# Patient Record
Sex: Female | Born: 2000 | Race: White | Hispanic: No | Marital: Single | Smoking: Never smoker
Health system: Southern US, Community
[De-identification: ages and names within clinical notes are randomized; demographics above are authoritative.]

## PROBLEM LIST (undated history)

## (undated) DIAGNOSIS — F259 Schizoaffective disorder, unspecified: Secondary | ICD-10-CM

---
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
## 2020-12-02 DIAGNOSIS — G90A Postural orthostatic tachycardia syndrome (POTS): Secondary | ICD-10-CM | POA: Diagnosis present

## 2020-12-02 DIAGNOSIS — F329 Major depressive disorder, single episode, unspecified: Secondary | ICD-10-CM | POA: Insufficient documentation

## 2020-12-02 DIAGNOSIS — F411 Generalized anxiety disorder: Secondary | ICD-10-CM | POA: Diagnosis present

## 2020-12-02 DIAGNOSIS — E559 Vitamin D deficiency, unspecified: Secondary | ICD-10-CM | POA: Insufficient documentation

## 2021-05-03 ENCOUNTER — Inpatient Hospital Stay (HOSPITAL_COMMUNITY)
Admission: RE | Admit: 2021-05-03 | Discharge: 2021-05-08 | DRG: 885 | Disposition: A | Discharge status: Home / Self Care | Payer: 59 | Attending: Psychiatry | Admitting: Psychiatry

## 2021-05-03 ENCOUNTER — Other Ambulatory Visit: Payer: Self-pay

## 2021-05-03 ENCOUNTER — Encounter (HOSPITAL_COMMUNITY): Payer: Self-pay | Admitting: Student

## 2021-05-03 ENCOUNTER — Encounter (HOSPITAL_COMMUNITY): Payer: Self-pay

## 2021-05-03 DIAGNOSIS — F411 Generalized anxiety disorder: Secondary | ICD-10-CM | POA: Diagnosis present

## 2021-05-03 DIAGNOSIS — Z20822 Contact with and (suspected) exposure to covid-19: Secondary | ICD-10-CM | POA: Diagnosis present

## 2021-05-03 DIAGNOSIS — R45851 Suicidal ideations: Secondary | ICD-10-CM | POA: Diagnosis present

## 2021-05-03 DIAGNOSIS — K5909 Other constipation: Secondary | ICD-10-CM | POA: Diagnosis present

## 2021-05-03 DIAGNOSIS — Z9152 Personal history of nonsuicidal self-harm: Secondary | ICD-10-CM | POA: Diagnosis not present

## 2021-05-03 DIAGNOSIS — F314 Bipolar disorder, current episode depressed, severe, without psychotic features: Principal | ICD-10-CM

## 2021-05-03 DIAGNOSIS — F410 Panic disorder [episodic paroxysmal anxiety] without agoraphobia: Secondary | ICD-10-CM | POA: Diagnosis present

## 2021-05-03 DIAGNOSIS — G4700 Insomnia, unspecified: Secondary | ICD-10-CM | POA: Diagnosis present

## 2021-05-03 DIAGNOSIS — G90A Postural orthostatic tachycardia syndrome (POTS): Secondary | ICD-10-CM | POA: Diagnosis present

## 2021-05-03 DIAGNOSIS — E559 Vitamin D deficiency, unspecified: Secondary | ICD-10-CM | POA: Diagnosis present

## 2021-05-03 DIAGNOSIS — Z79890 Hormone replacement therapy (postmenopausal): Secondary | ICD-10-CM | POA: Diagnosis not present

## 2021-05-03 DIAGNOSIS — F5081 Binge eating disorder: Secondary | ICD-10-CM | POA: Diagnosis present

## 2021-05-03 DIAGNOSIS — F4310 Post-traumatic stress disorder, unspecified: Secondary | ICD-10-CM | POA: Diagnosis present

## 2021-05-03 DIAGNOSIS — Z818 Family history of other mental and behavioral disorders: Secondary | ICD-10-CM | POA: Diagnosis not present

## 2021-05-03 DIAGNOSIS — F900 Attention-deficit hyperactivity disorder, predominantly inattentive type: Secondary | ICD-10-CM | POA: Diagnosis present

## 2021-05-03 DIAGNOSIS — F429 Obsessive-compulsive disorder, unspecified: Secondary | ICD-10-CM | POA: Diagnosis present

## 2021-05-03 DIAGNOSIS — E038 Other specified hypothyroidism: Secondary | ICD-10-CM | POA: Diagnosis present

## 2021-05-03 DIAGNOSIS — Z79899 Other long term (current) drug therapy: Secondary | ICD-10-CM | POA: Diagnosis not present

## 2021-05-03 DIAGNOSIS — F329 Major depressive disorder, single episode, unspecified: Secondary | ICD-10-CM | POA: Diagnosis present

## 2021-05-03 LAB — HEMOGLOBIN A1C
Hgb A1c MFr Bld: 5 % (ref 4.8–5.6)
Mean Plasma Glucose: 96.8 mg/dL

## 2021-05-03 LAB — COMPREHENSIVE METABOLIC PANEL
ALT: 11 U/L (ref 0–44)
AST: 18 U/L (ref 15–41)
Albumin: 3.9 g/dL (ref 3.5–5.0)
Alkaline Phosphatase: 55 U/L (ref 38–126)
Anion gap: 6 (ref 5–15)
BUN: 14 mg/dL (ref 6–20)
CO2: 26 mmol/L (ref 22–32)
Calcium: 9.2 mg/dL (ref 8.9–10.3)
Chloride: 106 mmol/L (ref 98–111)
Creatinine, Ser: 1.02 mg/dL — ABNORMAL HIGH (ref 0.44–1.00)
GFR, Estimated: >60 mL/min (ref >60)
Glucose, Bld: 114 mg/dL — ABNORMAL HIGH (ref 70–99)
Potassium: 4.1 mmol/L (ref 3.5–5.1)
Sodium: 138 mmol/L (ref 135–145)
Total Bilirubin: 0.3 mg/dL (ref 0.3–1.2)
Total Protein: 6.8 g/dL (ref 6.5–8.1)

## 2021-05-03 LAB — RAPID URINE DRUG SCREEN, HOSP PERFORMED
Amphetamines: NOT DETECTED
Barbiturates: NOT DETECTED
Benzodiazepines: NOT DETECTED
Cocaine: NOT DETECTED
Opiates: NOT DETECTED
Tetrahydrocannabinol: NOT DETECTED

## 2021-05-03 LAB — CBC
HCT: 40.8 % (ref 36.0–46.0)
Hemoglobin: 13.2 g/dL (ref 12.0–15.0)
MCH: 30.9 pg (ref 26.0–34.0)
MCHC: 32.4 g/dL (ref 30.0–36.0)
MCV: 95.6 fL (ref 80.0–100.0)
Platelets: 207 10*3/uL (ref 150–400)
RBC: 4.27 MIL/uL (ref 3.87–5.11)
RDW: 12.3 % (ref 11.5–15.5)
WBC: 8.8 10*3/uL (ref 4.0–10.5)
nRBC: 0 % (ref 0.0–0.2)

## 2021-05-03 LAB — LIPID PANEL
Cholesterol: 140 mg/dL (ref 0–200)
HDL: 47 mg/dL (ref >40)
LDL Cholesterol: 84 mg/dL (ref 0–99)
Total CHOL/HDL Ratio: 3 RATIO
Triglycerides: 47 mg/dL (ref <150)
VLDL: 9 mg/dL (ref 0–40)

## 2021-05-03 LAB — RESP PANEL BY RT-PCR (FLU A&B, COVID) ARPGX2
Influenza A by PCR: NEGATIVE
Influenza B by PCR: NEGATIVE
SARS Coronavirus 2 by RT PCR: NEGATIVE

## 2021-05-03 LAB — TSH: TSH: 6.815 u[IU]/mL — ABNORMAL HIGH (ref 0.350–4.500)

## 2021-05-03 LAB — LITHIUM LEVEL: Lithium Lvl: 0.17 mmol/L — ABNORMAL LOW (ref 0.60–1.20)

## 2021-05-03 LAB — PREGNANCY, URINE: Preg Test, Ur: NEGATIVE

## 2021-05-03 MED ORDER — MIDODRINE HCL 2.5 MG PO TABS
2.5000 mg | ORAL_TABLET | Freq: Two times a day (BID) | ORAL | Status: DC
  Administered 2021-05-03 – 2021-05-08 (×11): 2.5 mg via ORAL
  Filled 2021-05-03 (×13): qty 1

## 2021-05-03 MED ORDER — METFORMIN HCL 500 MG PO TABS
500.0000 mg | ORAL_TABLET | Freq: Every day | ORAL | Status: DC
  Administered 2021-05-04 – 2021-05-08 (×5): 500 mg via ORAL
  Filled 2021-05-03 (×7): qty 1

## 2021-05-03 MED ORDER — CARIPRAZINE HCL 3 MG PO CAPS
6.0000 mg | ORAL_CAPSULE | Freq: Every day | ORAL | Status: DC
  Filled 2021-05-03 (×2): qty 2

## 2021-05-03 MED ORDER — LITHIUM CARBONATE ER 450 MG PO TBCR
450.0000 mg | EXTENDED_RELEASE_TABLET | Freq: Two times a day (BID) | ORAL | Status: DC
  Administered 2021-05-03 – 2021-05-08 (×10): 450 mg via ORAL
  Filled 2021-05-03 (×14): qty 1

## 2021-05-03 MED ORDER — HYDROXYZINE HCL 25 MG PO TABS
25.0000 mg | ORAL_TABLET | Freq: Three times a day (TID) | ORAL | Status: DC | PRN
  Administered 2021-05-03 – 2021-05-07 (×5): 25 mg via ORAL
  Filled 2021-05-03 (×5): qty 1

## 2021-05-03 MED ORDER — OLANZAPINE 5 MG PO TABS
5.0000 mg | ORAL_TABLET | Freq: Every day | ORAL | Status: DC
  Administered 2021-05-03 – 2021-05-07 (×5): 5 mg via ORAL
  Filled 2021-05-03 (×7): qty 1

## 2021-05-03 MED ORDER — CARIPRAZINE HCL 1.5 MG PO CAPS: 1.5000 mg | ORAL_CAPSULE | Freq: Every day | ORAL | Status: DC

## 2021-05-03 MED ORDER — LEVONORGEST-ETH ESTRAD 91-DAY 0.15-0.03 &0.01 MG PO TABS
1.0000 | ORAL_TABLET | Freq: Every day | ORAL | Status: DC
  Administered 2021-05-03 – 2021-05-07 (×5): 1 via ORAL

## 2021-05-03 MED ORDER — LITHIUM CARBONATE ER 300 MG PO TBCR
300.0000 mg | EXTENDED_RELEASE_TABLET | Freq: Two times a day (BID) | ORAL | Status: DC
  Administered 2021-05-03: 08:00:00 300 mg via ORAL
  Filled 2021-05-03 (×5): qty 1

## 2021-05-03 MED ORDER — ENSURE ENLIVE PO LIQD
237.0000 mL | Freq: Two times a day (BID) | ORAL | Status: DC
  Administered 2021-05-03 – 2021-05-08 (×9): 237 mL via ORAL
  Filled 2021-05-03 (×13): qty 237

## 2021-05-03 MED ORDER — TRAZODONE HCL 50 MG PO TABS
50.0000 mg | ORAL_TABLET | Freq: Every evening | ORAL | Status: DC | PRN
  Administered 2021-05-03 – 2021-05-07 (×5): 50 mg via ORAL
  Filled 2021-05-03 (×5): qty 1

## 2021-05-03 MED ORDER — LEVOTHYROXINE SODIUM 50 MCG PO TABS
50.0000 ug | ORAL_TABLET | Freq: Every day | ORAL | Status: DC
  Administered 2021-05-03 – 2021-05-07 (×5): 50 ug via ORAL
  Filled 2021-05-03 (×7): qty 1

## 2021-05-03 MED ORDER — CARIPRAZINE HCL 3 MG PO CAPS
3.0000 mg | ORAL_CAPSULE | Freq: Every day | ORAL | Status: DC
  Administered 2021-05-03: 21:00:00 3 mg via ORAL
  Filled 2021-05-03 (×2): qty 1

## 2021-05-03 MED ORDER — MAGNESIUM HYDROXIDE 400 MG/5ML PO SUSP: 30.0000 mL | Freq: Every day | ORAL | Status: DC | PRN

## 2021-05-03 MED ORDER — FLUOXETINE HCL 10 MG PO CAPS
10.0000 mg | ORAL_CAPSULE | Freq: Every day | ORAL | Status: DC
Start: 2021-05-04 — End: 2021-05-05
  Administered 2021-05-04 – 2021-05-05 (×2): 10 mg via ORAL
  Filled 2021-05-03 (×5): qty 1

## 2021-05-03 MED ORDER — ACETAMINOPHEN 325 MG PO TABS
650.0000 mg | ORAL_TABLET | Freq: Four times a day (QID) | ORAL | Status: DC | PRN
  Administered 2021-05-03: 11:00:00 650 mg via ORAL
  Filled 2021-05-03: qty 2

## 2021-05-03 MED ORDER — ALUM & MAG HYDROXIDE-SIMETH 200-200-20 MG/5ML PO SUSP: 30.0000 mL | ORAL | Status: DC | PRN

## 2021-05-03 NOTE — Progress Notes (Signed)
°   05/03/21 2112  Psych Admission Type (Psych Patients Only)  Admission Status Voluntary  Psychosocial Assessment  Patient Complaints Anxiety;Worrying;Tension;Hopelessness  Eye Contact Fair  Facial Expression Anxious;Sad;Trembling lip  Affect Anxious  Speech Logical/coherent;Soft  Interaction Assertive  Motor Activity Other (Comment) (WDL)  Appearance/Hygiene Unremarkable  Behavior Characteristics Cooperative;Appropriate to situation  Mood Depressed;Anxious  Thought Process  Coherency WDL  Content WDL  Delusions None reported or observed  Perception WDL  Hallucination None reported or observed  Judgment Impaired  Confusion None  Danger to Self  Current suicidal ideation? Passive  Self-Injurious Behavior No self-injurious ideation or behavior indicators observed or expressed   Agreement Not to Harm Self Yes  Description of Agreement Verbal contract  Danger to Others  Danger to Others None reported or observed

## 2021-05-03 NOTE — BH Assessment (Signed)
Comprehensive Clinical Assessment (CCA) Note  05/03/2021 Sabrina Bennett [id number]  Disposition: Sabrina John, PA-C recommends inpatient treatment. Per Sabrina Kells, RN pt has been accepted to [facility] room/bed: 402-2. Attending physician Dr. Berdine Bennett.   Flowsheet Row OP Visit from 05/03/2021 in [facility]  C-SSRS RISK CATEGORY High Risk      The patient demonstrates the following risk factors for suicide: Chronic risk factors for suicide include: psychiatric disorder of Bipolar 1 Disorder, current episode depressed, severe and previous self-harm Pt reports, she last cut was 7-8 months ago . Acute risk factors for suicide include:  work and school . Protective factors for this patient include: positive social support and positive therapeutic relationship. Considering these factors, the overall suicide risk at this point appears to be high. Patient is not appropriate for outpatient follow up.  Sabrina Bennett is a 20 year old female who presents voluntary and accompanied by her mother Sabrina Bennett, [phone]). Clinician asked the pt, "what brought you to the hospital?" Pt reports, "things really bad," she has an eating disorder (Restrictive eating), "depression at all time low, just work and school is all I do." Pt reports, her depression has worsen over the last year but really, really got bad within the last three months. Pt reports, she and her boyfriend had been traveling for Christmas once they got back she planned on using her boyfriends gun to commit suicide. Pt reports, having a history of cutting, she last cut herself was 7-8 months ago on her legs and thighs with an exacto knife. Pt reports, she's been hearing things for a couple of months. Pt reports, sometimes she hears a ringing sound, a high pitched scream and someone calling her name; a couple times per week. Per mother, guns at home are locked up and pt's boyfriend to lock up his gun. Pt denies, HI,  current self-injurious behaviors and access to weapons.   Pt denies, substance use. Pt is linked to Sabrina Bennett, psychiatrist and Sabrina Bennett, Sabrina Bennett at [facility] in [place], [place]. Pt seen her psychiatrist and therapist last week. Per mother, the plan is for the pt to address her mood and suicidal ideations then she will be linked to [facility] for focus on her eating disorder.   Pt's presents quiet, awake but tearful at times during those times her mother answered questions on her behalf. Pt's mood, affect was depressed, anxious. Pt's insight was fair. Pt's judgement was impaired.   Diagnosis: Bipolar 1 Disorder, current episode depressed, severe.   Chief Complaint: No chief complaint on file.  Visit Diagnosis:     CCA Screening, Triage and Referral (STR)  Patient Reported Information How did you hear about [place]? Family/Friend  What Is the Reason for Your Visit/Call Today? No data recorded How Long Has This Been Causing You Problems? > than 6 months  What Do You Feel Would Help You the Most Today? Treatment for Depression or other mood problem; Medication(s)   Have You Recently Had Any Thoughts About Hurting Yourself? Yes  Are You Planning to Commit Suicide/Harm Yourself At This time? Yes   Have you Recently Had Thoughts About Hurting Someone Sabrina Bennett? No  Are You Planning to Harm Someone at This Time? No  Explanation: No data recorded  Have You Used Any Alcohol or Drugs in the Past 24 Hours? No  How Long Ago Did You Use Drugs or Alcohol? No data recorded What Did You Use and How Much? No data recorded  Do You Currently Have a Therapist/Psychiatrist? Yes  Name of Therapist/Psychiatrist: Dr. Lynnea Bennett, psychiatrist and Sabrina Bennett, [facility] in [place], [place].   Have You Been Recently Discharged From Any Office Practice or Programs? No data recorded Explanation of Discharge From Practice/Program: No data recorded    CCA  Screening Triage Referral Assessment Type of Contact: Face-to-Face  Telemedicine Service Delivery:   Is this Initial or Reassessment? No data recorded Date Telepsych consult ordered in CHL:  No data recorded Time Telepsych consult ordered in CHL:  No data recorded Location of Assessment: [facility]  [facility]   Collateral Involvement: Sabrina Bennett, mother, [phone].   Does Patient Have a Stage manager Guardian? No data recorded Name and Contact of Legal Guardian: No data recorded If Minor and Not Living with Parent(s), Who has Custody? No data recorded Is CPS involved or ever been involved? No data recorded Is APS involved or ever been involved? No data recorded  Patient Determined To Be At Risk for Harm To Self or Others Based on Review of Patient Reported Information or Presenting Complaint? Yes, for Self-Harm  Method: No data recorded Availability of Means: No data recorded Intent: No data recorded Notification Required: No data recorded Additional Information for Danger to Others Potential: No data recorded Additional Comments for Danger to Others Potential: No data recorded Are There Guns or Other Weapons in Your Home? No data recorded Types of Guns/Weapons: No data recorded Are These Weapons Safely Secured?                            No data recorded Who Could Verify You Are Able To Have These Secured: No data recorded Do You Have any Outstanding Charges, Pending Court Dates, Parole/Probation? No data recorded Contacted To Inform of Risk of Harm To Self or Others: No data recorded   Does Patient Present under Involuntary Commitment? No  IVC Papers Initial File Date: No data recorded  [place] of Residence: [place]   Patient Currently Receiving the Following Services: Individual Therapy; Medication Management   Determination of Need: Emergent (2 hours)   Options For Referral: Medication  Management     CCA Biopsychosocial Patient Reported Schizophrenia/Schizoaffective Diagnosis in Past: No data recorded  Strengths: No data recorded  Mental Health Symptoms Depression:   Irritability; Increase/decrease in appetite; Hopelessness; Worthlessness; Fatigue; Difficulty Concentrating; Change in energy/activity; Sleep (too much or little); Tearfulness (Feeling guilty (she doesn't know why.))   Duration of Depressive symptoms:    Mania:  No data recorded  Anxiety:    Worrying; Tension; Restlessness; Difficulty concentrating; Fatigue; Irritability (Pt reports, having panic attacks once per day.)   Psychosis:  No data recorded  Duration of Psychotic symptoms:    Trauma:  No data recorded  Obsessions:  No data recorded  Compulsions:  No data recorded  Inattention:   Disorganized; Forgetful; Loses things   Hyperactivity/Impulsivity:   Feeling of restlessness; Fidgets with hands/feet   Oppositional/Defiant Behaviors:  No data recorded  Emotional Irregularity:   Recurrent suicidal behaviors/gestures/threats   Other Mood/Personality Symptoms:  No data recorded   Mental Status Exam Appearance and self-care  Stature:   Average   Weight:   Average weight   Clothing:   Casual   Grooming:   Normal   Cosmetic use:   None   Posture/gait:   Normal   Motor activity:   Not Remarkable   Sensorium  Attention:  Normal   Concentration:   Normal   Orientation:   X5   Recall/memory:   Normal   Affect and Mood  Affect:   Depressed   Mood:   Depressed   Relating  Eye contact:   Normal   Facial expression:   Depressed   Attitude toward examiner:   Cooperative   Thought and Language  Speech flow:  Normal   Thought content:   Appropriate to Mood and Circumstances   Preoccupation:   None   Hallucinations:   Auditory; Visual   Organization:  No data recorded  [employer] of Knowledge:   Fair   Intelligence:  No data  recorded  Abstraction:  No data recorded  Judgement:   Impaired   Reality Testing:  No data recorded  Insight:   Fair   Decision Making:   Impulsive   Social Functioning  Social Maturity:   Impulsive   Social Judgement:   Heedless   Stress  Stressors:   School; Work   Coping Ability:   Programme researcher, broadcasting/film/video Deficits:   Self-control   Supports:   Family     Religion: Religion/Spirituality Are You A Religious Person?: No  Leisure/Recreation: Leisure / Recreation Do You Have Hobbies?: Yes Leisure and Hobbies: Reading. 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  Exercise/Diet: Exercise/Diet Do You Follow a Special Diet?:  (Pt is a restrictive eater.) Do You Have Any Trouble Sleeping?: Yes Explanation of Sleeping Difficulties: Pt reports, getting 4-5 hours of sleep.   CCA Employment/Education Employment/Work Situation: Employment / Work Situation Employment Situation: Employed Has Patient ever Been in Passenger transport manager?: No  Education: Education Is Patient Currently Attending School?: Yes School Currently Attending: [employer], senior, Risk analyst. Last Grade Completed: 12 Did You Attend College?: Yes What Type of College Degree Do you Have?: Pt is as Equities trader at [employer].   CCA Family/Childhood History Family and Relationship History: Family history Marital status: Single Does patient have children?: No  Childhood History:  Childhood History By whom was/is the patient raised?: Both parents Did patient suffer any verbal/emotional/physical/sexual abuse as a child?: No Did patient suffer from severe childhood neglect?: No Has patient ever been sexually abused/assaulted/raped as an adolescent or adult?: No Was the patient ever a victim of a crime or a disaster?: No Witnessed domestic violence?: No Has patient been affected by domestic violence as an adult?:  (NA)  Child/Adolescent  Assessment:     CCA Substance Use Alcohol/Drug Use: Alcohol / Drug Use Pain Medications: See MAR Prescriptions: See MAR Over the Counter: See MAR    ASAM's:  Six Dimensions of Multidimensional Assessment  Dimension 1:  Acute Intoxication and/or Withdrawal Potential:      Dimension 2:  Biomedical Conditions and Complications:      Dimension 3:  Emotional, Behavioral, or Cognitive Conditions and Complications:     Dimension 4:  Readiness to Change:     Dimension 5:  Relapse, Continued use, or Continued Problem Potential:     Dimension 6:  Recovery/Living Environment:     ASAM Severity Score:    ASAM Recommended Level of Treatment:     Substance use Disorder (SUD)    Recommendations for Services/Supports/Treatments: Recommendations for Services/Supports/Treatments Recommendations For Services/Supports/Treatments: Inpatient Hospitalization  Discharge Disposition:    DSM5 Diagnoses: There are no problems to display for this patient.    Referrals to Alternative Service(s): Referred to Alternative Service(s):   Place:   Date:  Time:    Referred to Alternative Service(s):   Place:   Date:   Time:    Referred to Alternative Service(s):   Place:   Date:   Time:    Referred to Alternative Service(s):   Place:   Date:   Time:     Vertell Novak, [facility] Comprehensive Clinical Assessment (CCA) Screening, Triage and Referral Note  05/03/2021 Sabrina Bennett [id number]  Chief Complaint: No chief complaint on file.  Visit Diagnosis:   Patient Reported Information How did you hear about [place]? Family/Friend  What Is the Reason for Your Visit/Call Today? No data recorded How Long Has This Been Causing You Problems? > than 6 months  What Do You Feel Would Help You the Most Today? Treatment for Depression or other mood problem; Medication(s)   Have You Recently Had Any Thoughts About Hurting Yourself? Yes  Are You Planning to Commit Suicide/Harm Yourself At This time?  Yes   Have you Recently Had Thoughts About Hurting Someone Sabrina Bennett? No  Are You Planning to Harm Someone at This Time? No  Explanation: No data recorded  Have You Used Any Alcohol or Drugs in the Past 24 Hours? No  How Long Ago Did You Use Drugs or Alcohol? No data recorded What Did You Use and How Much? No data recorded  Do You Currently Have a Therapist/Psychiatrist? Yes  Name of Therapist/Psychiatrist: Dr. Lynnea Bennett, psychiatrist and Sabrina Bennett, [facility] at [facility] in [place], [place].   Have You Been Recently Discharged From Any Office Practice or Programs? No data recorded Explanation of Discharge From Practice/Program: No data recorded   CCA Screening Triage Referral Assessment Type of Contact: Face-to-Face  Telemedicine Service Delivery:   Is this Initial or Reassessment? No data recorded Date Telepsych consult ordered in CHL:  No data recorded Time Telepsych consult ordered in CHL:  No data recorded Location of Assessment: [facility]  Provider Location: [facility]   Collateral Involvement: Sabrina Bennett, mother, [phone].   Does Patient Have a Stage manager Guardian? No data recorded Name and Contact of Legal Guardian: No data recorded If Minor and Not Living with Parent(s), Who has Custody? No data recorded Is CPS involved or ever been involved? No data recorded Is APS involved or ever been involved? No data recorded  Patient Determined To Be At Risk for Harm To Self or Others Based on Review of Patient Reported Information or Presenting Complaint? Yes, for Self-Harm  Method: No data recorded Availability of Means: No data recorded Intent: No data recorded Notification Required: No data recorded Additional Information for Danger to Others Potential: No data recorded Additional Comments for Danger to Others Potential: No data recorded Are There Guns or Other Weapons in Your Home? No data recorded Types  of Guns/Weapons: No data recorded Are These Weapons Safely Secured?                            No data recorded Who Could Verify You Are Able To Have These Secured: No data recorded Do You Have any Outstanding Charges, Pending Court Dates, Parole/Probation? No data recorded Contacted To Inform of Risk of Harm To Self or Others: No data recorded  Does Patient Present under Involuntary Commitment? No  IVC Papers Initial File Date: No data recorded  [place] of Residence: [place]   Patient Currently Receiving the Following Services: Individual Therapy; Medication Management   Determination of Need: Emergent (2 hours)  Options For Referral: Medication Management   Discharge Disposition:     Redmond Pulling, [facility]     Redmond Pulling, MS, [facility], [facility] Triage Specialist [phone]
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
## 2021-05-03 NOTE — Group Note (Signed)
Recreation Therapy Group Note   Group Topic:Stress Management  Group Date: 05/03/2021 Start Time: 0930 End Time: 0945 Facilitators: Caroll Rancher, LRT,CTRS Location: [address] Dayroom   Goal Area(s) Addresses:  Patient will actively participate in stress management techniques presented during session.  Patient will successfully identify benefit of practicing stress management post d/c.    Group Description: Guided Imagery. LRT provided education, instruction, and demonstration on practice of visualization via guided imagery. Patient was asked to participate in the technique introduced during session. LRT debriefed including topics of mindfulness, stress management and specific scenarios each patient could use these techniques. Patients were given suggestions of ways to access scripts post d/c and encouraged to explore Youtube and other apps available on smartphones, tablets, and computers.   Affect/Mood: Appropriate   Participation Level: Engaged   Participation Quality: Independent   Behavior: Appropriate   Speech/Thought Process: Focused   Insight: Good   Judgement: Good   Modes of Intervention: Script, [place] Sounds   Patient Response to Interventions:  Engaged   Education Outcome:  Acknowledges education and In group clarification offered    Clinical Observations/Individualized Feedback: Pt attended and participated in group.   Plan: Continue to engage patient in RT group sessions 2-3x/week.   Caroll Rancher, LRT,CTRS 05/03/2021 12:00 PM
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
## 2021-05-03 NOTE — Tx Team (Signed)
Initial Treatment Plan 05/03/2021 4:26 AM [name] ZYY:[id number]    PATIENT STRESSORS: Marital or family conflict   Medication change or noncompliance     PATIENT STRENGTHS: Average or above average intelligence  General fund of knowledge  Motivation for treatment/growth  Supportive family/friends    PATIENT IDENTIFIED PROBLEMS: Risk for SI  Psychosis  depression  anxiety  "Getting back to feeling normal"             DISCHARGE CRITERIA:  Improved stabilization in mood, thinking, and/or behavior Verbal commitment to aftercare and medication compliance  PRELIMINARY DISCHARGE PLAN: Attend aftercare/continuing care group Attend PHP/IOP Outpatient therapy  PATIENT/FAMILY INVOLVEMENT: This treatment plan has been presented to and reviewed with the patient, Gwendolen Mcgough.  The patient and family have been given the opportunity to ask questions and make suggestions.  Delos Haring, RN 05/03/2021, 4:26 AM
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
## 2021-05-03 NOTE — BH IP Treatment Plan (Signed)
Interdisciplinary Treatment and Diagnostic Plan Update  05/03/2021 Time of Session: 0900  Sindy Mccune MRN: [id number]  Principal Diagnosis: Bipolar 1 disorder, depressed, severe ([place])  Secondary Diagnoses: Principal Problem:   Bipolar 1 disorder, depressed, severe ([place])   Current Medications:  Current Facility-Administered Medications  Medication Dose Route Frequency Provider Last Rate Last Admin   acetaminophen (TYLENOL) tablet 650 mg  650 mg Oral Q6H PRN Prescilla Sours, PA-C   650 mg at 05/03/21 1124   alum & mag hydroxide-simeth (MAALOX/MYLANTA) 200-200-20 MG/5ML suspension 30 mL  30 mL Oral Q4H PRN [name] W, PA-C       cariprazine (VRAYLAR) capsule 6 mg  6 mg Oral QHS Lovena Le, Cody W, PA-C       feeding supplement (ENSURE ENLIVE / ENSURE PLUS) liquid 237 mL  237 mL Oral BID BM [name] W, PA-C   237 mL at 05/03/21 1418   hydrOXYzine (ATARAX) tablet 25 mg  25 mg Oral TID PRN Prescilla Sours, PA-C       Levonorgestrel-Ethinyl Estradiol (AMETHIA) 0.15-0.03 &0.01 MG tablet 1 tablet  1 tablet Oral QHS Lovena Le, Cody W, PA-C       levothyroxine (SYNTHROID) tablet 50 mcg  50 mcg Oral Daily [name] W, PA-C   50 mcg at 05/03/21 2878   lithium carbonate (LITHOBID) CR tablet 300 mg  300 mg Oral BID [name] W, PA-C   300 mg at 05/03/21 0827   magnesium hydroxide (MILK OF MAGNESIA) suspension 30 mL  30 mL Oral Daily PRN [name] W, PA-C       midodrine (PROAMATINE) tablet 2.5 mg  2.5 mg Oral BID [name] W, PA-C   2.5 mg at 05/03/21 0827   traZODone (DESYREL) tablet 50 mg  50 mg Oral QHS PRN Prescilla Sours, PA-C       PTA Medications: Medications Prior to Admission  Medication Sig Dispense Refill Last Dose   CAPLYTA 42 MG capsule Take 42 mg by mouth at bedtime.   not started yet   levothyroxine (SYNTHROID) 50 MCG tablet Take 50 mcg by mouth daily.   05/01/2021   lithium carbonate (LITHOBID) 300 MG CR tablet Take 300 mg by mouth 2 (two) times daily.   05/01/2021    midodrine (PROAMATINE) 2.5 MG tablet Take 2.5 mg by mouth 2 (two) times daily.   05/01/2021   SIMPESSE 0.15-0.03 &0.01 MG tablet Take 1 tablet by mouth at bedtime.   05/01/2021   VRAYLAR 6 MG CAPS Take 6 mg by mouth at bedtime.   05/01/2021    Patient Stressors: Marital or family conflict   Medication change or noncompliance    Patient Strengths: Average or above average intelligence  General fund of knowledge  Motivation for treatment/growth  Supportive family/friends   Treatment Modalities: Medication Management, Group therapy, Case management,  1 to 1 session with clinician, Psychoeducation, Recreational therapy.   Physician Treatment Plan for Primary Diagnosis: Bipolar 1 disorder, depressed, severe ([place]) Long Term Goal(s):     Short Term Goals:    Medication Management: Evaluate patient's response, side effects, and tolerance of medication regimen.  Therapeutic Interventions: 1 to 1 sessions, Unit Group sessions and Medication administration.  Evaluation of Outcomes: Not Met  Physician Treatment Plan for Secondary Diagnosis: Principal Problem:   Bipolar 1 disorder, depressed, severe ([place])  Long Term Goal(s):     Short Term Goals:       Medication Management: Evaluate patient's response, side effects, and tolerance of medication  regimen.  Therapeutic Interventions: 1 to 1 sessions, Unit Group sessions and Medication administration.  Evaluation of Outcomes: Not Met   RN Treatment Plan for Primary Diagnosis: Bipolar 1 disorder, depressed, severe ([place]) Long Term Goal(s): Knowledge of disease and therapeutic regimen to maintain health will improve  Short Term Goals: Ability to remain free from injury will improve, Ability to verbalize frustration and anger appropriately will improve, Ability to demonstrate self-control, Ability to participate in decision making will improve, Ability to verbalize feelings will improve, Ability to disclose and discuss suicidal ideas, Ability  to identify and develop effective coping behaviors will improve, and Compliance with prescribed medications will improve  Medication Management: RN will administer medications as ordered by provider, will assess and evaluate patient's response and provide education to patient for prescribed medication. RN will report any adverse and/or side effects to prescribing provider.  Therapeutic Interventions: 1 on 1 counseling sessions, Psychoeducation, Medication administration, Evaluate responses to treatment, Monitor vital signs and CBGs as ordered, Perform/monitor CIWA, COWS, AIMS and Fall Risk screenings as ordered, Perform wound care treatments as ordered.  Evaluation of Outcomes: Not Met   LCSW Treatment Plan for Primary Diagnosis: Bipolar 1 disorder, depressed, severe ([place]) Long Term Goal(s): Safe transition to appropriate next level of care at discharge, Engage patient in therapeutic group addressing interpersonal concerns.  Short Term Goals: Engage patient in aftercare planning with referrals and resources, Increase social support, Increase ability to appropriately verbalize feelings, Increase emotional regulation, Facilitate acceptance of mental health diagnosis and concerns, Facilitate patient progression through stages of change regarding substance use diagnoses and concerns, Identify triggers associated with mental health/substance abuse issues, and Increase skills for wellness and recovery  Therapeutic Interventions: Assess for all discharge needs, 1 to 1 time with Social worker, Explore available resources and support systems, Assess for adequacy in community support network, Educate family and significant other(s) on suicide prevention, Complete Psychosocial Assessment, Interpersonal group therapy.  Evaluation of Outcomes: Not Met   Progress in Treatment: Attending groups: Yes. Participating in groups: Yes. Taking medication as prescribed: Yes. Toleration medication:  Yes. Family/Significant other contact made: No, will contact:  CSW will reach out to family once consent is obtained.  Patient understands diagnosis: Yes. Discussing patient identified problems/goals with staff: Yes. Medical problems stabilized or resolved: Yes. Denies suicidal/homicidal ideation: No. Issues/concerns per patient self-inventory: Yes. Other: none    New problem(s) identified: No, Describe:  No additional problems identified at this time.   New Short Term/Long Term Goal(s): Patient to work towards  medication management for mood stabilization; elimination of SI thoughts; development of comprehensive mental wellness plan.   Patient Goals:  Patient did not attend treatment team.   Discharge Plan or Barriers: No barriers identified at this time. Patient to return to place of residence.   Reason for Continuation of Hospitalization: Depression  Estimated Length of Stay: TBD    Scribe for Treatment Team: [name] 05/03/2021 2:20 PM

## 2021-05-03 NOTE — Group Note (Signed)
LCSW Group Therapy Note   Group Date: 05/03/2021 Start Time: 1300 End Time: 1400   Type of Group and Topic: Psychoeducational Group: Discharge Planning  Participation Level: Active  Description of Group  Discharge planning group reviews patient's anticipated discharge plans and assists patients to anticipate and address any barriers to wellness/recovery in the community. Suicide prevention education is reviewed with patients in group.  Therapeutic Goals  1. Patients will state their anticipated discharge plan and mental health aftercare  2. Patients will identify potential barriers to wellness in the community setting  3. Patients will engage in problem solving, solution focused discussion of ways to anticipate and address barriers to wellness/recovery  Summary of Patient Progress: The Pt attended group and remained there the entire time.  The Pt accepted the 5 W's worksheet and participated in filling it out.  The Pt participated in the group discussion and was appropriate with their peers.   Aram Beecham, LCSWA 05/03/2021  2:07 PM

## 2021-05-03 NOTE — Progress Notes (Signed)
Patient rated her day as a 5 out of 10 and admits to having suicidal thoughts. Her goal for tomorrow is to open up more with her therapist and to find out more about why she is here.

## 2021-05-03 NOTE — Progress Notes (Signed)
DAR NOTE: Patient presents with a flat affect and depressed mood.  Reports suicidal thoughts but verbally contracts for safety.  Described energy level as low with poor  concentration.  Rates depression at 9, hopelessness at 9, and anxiety at 10.  Maintained on routine safety checks.  Medications given as prescribed.  Support and encouragement offered as needed.  Attended group and participated.  States goal for today is "feeling better about being in here."  Patient visible in milieu with minimal interaction.   Patient is safe on the unit.

## 2021-05-03 NOTE — H&P (Addendum)
Behavioral Health Medical Screening Exam  Visit Diagnoses:   -Bipolar 1 disorder, depressed, severe (HCC)  -Suicidal ideation   -Generalized anxiety disorder  Sabrina Bennett is a 20 y.o. female with past psychiatric history significant for bipolar disorder, major depressive disorder, generalized anxiety disorder, and restrictive pattern eating disorder, as well as past medical history significant for hypothyroidism, vitamin D deficiency, POTS, and orthostatic hypotension, who presents to the [facility]) as a voluntary walk-in accompanied by her mother Bernece Gall: [phone]) and boyfriend for worsening depressive symptoms and suicidal ideation.  With patient's consent, patient's mother present during the evaluation and information was obtained from the patient and patient's mother during the evaluation.  Patient reports that she came to [facility] because "things have been getting really bad".  Patient's mother then reports that patient's depression is "at an all-time low" and that the patient "feels like she doesn't want to be here".  Patient reports "everything's laying on the and I've gotten to the point where I don't feel like I can take it any more".  Patient reports that she has been experiencing suicidal ideation for multiple years.  She states that her suicidal ideation has gotten worse over this past year and has severely worsened over the past 3 months.  Patient denies SI currently on exam, but patient does endorse having active SI recently last night on 05/02/2021 with intent and plan to attempt suicide by shooting herself with her boyfriend's gun.  Patient reports that she and her boyfriend have been traveling out of town for the holidays and that she had a plan to get her boyfriend's gun and shoot herself upon arriving back to their apartment from traveling last night.  Patient denies actually acquiring boyfriend's firearm or attempting to harm herself with this  firearm in any way.  Patient denies history of any past suicide attempts.  She does endorse having previous plans to attempt suicide, in which she states that her last suicidal plan was at the age of 77 in which she had a plan to attempt suicide by slitting her wrists at that time.  Patient also endorses previous history of self-injurious behavior via intentionally cutting herself.  She reports that the last time she engaged in self-injurious behavior via cutting was 7 to 8 months ago.  Patient reports that prior to 7 to 8 months ago, she had been intentionally cutting her bilateral hips with an X-Acto knife almost every day for multiple years.  She denies history of intentionally burning herself.  She denies homicidal ideations.  Patient denies AVH currently on exam, but she does endorse experiencing auditory hallucinations a few times per week for the past few months and endorses experiencing visual hallucinations a few times per week for the past few years.  She initially describes her auditory hallucinations as "hearing sounds that aren't there" and then further describes her auditory hallucinations as ringing and the voices of multiple people (patient reports that she does not know who the voices belong to) that will scream in a high pitch and occasionally say her name.  She reports that she last experienced auditory hallucinations 2 days ago.  Patient describes her visual hallucinations as "seeing shadow figures".  She reports that she last experienced visual hallucinations 2 days ago as well.  Patient reports that her main stressors that are contributing to her worsening depression and SI are stress related to school and work.  Patient reports that recently she has felt like she does not have  time to engage in any other activities that she enjoys due to having to spend all of her time on school and work.  She describes her sleep as poor, about 4 to 5 hours on a good night.  She endorses difficulty with  falling asleep and staying asleep.  She endorses having nightmares intermittently 2-3 times per week.  She reports that she often cannot remember what her nightmares consisted of.  She denies that the nightmare she can remember are related to any past traumatic experiences.  She endorses intermittent feelings of anhedonia.  Patient endorses feelings of guilt, hopelessness, and worthlessness over the past few months and states that she does not know why she feels guilty.  Patient's mother then states that the patient often feels like she is a burden on patient's mother and everybody else.  Patient endorses declines in energy, concentration, and appetite over the past few months.  Patient denies any weight changes over the past few months.  Patient and patient's mother report that the patient has a history of restrictive eating disorder since the age of 45.  Patient reports that she will usually only eat 1 meal per day.  Patient reports that earlier today, she had half of a meal from [employer] and also has some macaroni and cheese.  Patient and patient's mother report that patient's last meal was on the evening of 05/02/2021 around 7:30 PM.  Patient and patient's mother also report that the patient has a history of binge eating and they state that the patient was put on Vyvanse about 1 year ago due to patient gaining 30 to 40 pounds secondary to binge eating, but patient and patient's mother report that the patient is no longer taking Vyvanse due to the patient experiencing pleasant side effects from the medication.  Patient denies history of purging.  Patient and patient's mother report that yesterday on 05/02/2021, the patient called and spoke with [facility] facility in [facility] regarding the patient potentially being admitted to this facility for further care for eating disorder and patient and patient's mother report that the patient was told over the phone by a [employer] staff member during this phone call  that the patient would need to be psychiatrically hospitalized in order to receive further assistance with her mood and depression/psychiatric symptoms before she could potentially be admitted to [facility] for further care for her eating disorder.  Patient reports that she currently has a psychiatrist (Levada Schilling) and therapist Bradly Bienenstock) through [facility] in [place].  Patient and patient's mother report that the patient last saw her psychiatrist and therapist last week and they report that during patient's therapy visit last week, patient was recommended by her therapist to consider receiving an inpatient psychiatric admission.  Patient and patient's mother report that patient's current psychotropic medication regimen consists of Vraylar 6 mg p.o. daily at bedtime and lithium 300 mg extended release p.o. twice daily.  Patient reports that she takes her home medications as prescribed.  Patient and patient's mother report that the patient has been prescribed Caplyta 42 mg p.o. daily at bedtime as well, but patient and patient's mother report that the patient has not started taking the Caplyta yet because the patient has been instructed by her psychiatrist to taper off of her Vraylar prior to starting the Caplyta.  More specifically, patient and patient's mother report that the patient is supposed to be tapering her dosage of Vraylar down from 6 mg once daily to 3 mg  once daily, by starting Vraylar 3 mg once daily x7 days, prior to starting the Caplyta, but patient and patient's mother report that the patient has been unable to start her Vraylar taper due to patient being unable to pick up prescription for Vraylar 3 mg from her pharmacy due to insurance issues.  Patient and patient's mother report that the patient is not taking any other psychotropic medications at this time.  Per patient's mother, prior psychotropic medication trials include Vyvanse (see details regarding Vyvanse  above), trazodone, sertraline, lamotrigine, and olanzapine.  Patient reports that when she began taking the lithium in the past, she felt that her mental health was improved for about 3 weeks, but she states that this was short-lived.  Patient also reports that these other psychotropic medications that she is taking in the past (see details above) helped for about 1 month with her mental health symptoms and then she reports that the benefits of the medications "wore off and then I was right back at square [address]".  Patient denies history of any past inpatient psychiatric hospitalizations.  Patient currently lives in [place] with her boyfriend.  Patient's mother reports that patient's boyfriend has one firearm and patient and patient's boyfriend's apartment.  Patient's mother and patient report that patient's boyfriend is going to lock up this firearm upon returning home to their apartment later this morning.  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  Patient states that she has been attending [employer] for 2 years.  She reports that she moved to [facility] in August 2022 from [facility], where she was living there with her mother, father, and 6 year old brother prior to the move to [place].  Patient's mother reports that there are firearms in her home back in [facility] as well, but she states that these firearms are locked up as well.  She reports that prior to moving to [facility] in August 2022, she was attending school at [employer] remotely on line.  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  She reports that she works 8 to 10-hour shifts, 3 days/week.  Patient reports drinking alcohol on rare occasion, once every few months.  She reports that her last alcohol consumption was 1 month ago.  Patient denies tobacco/nicotine or illicit  substance use.  On exam, patient is sitting upright, appearing well-groomed and tearful at times during the evaluation, but in no acute distress.  Eye contact is good.  Speech is clear and coherent with normal rate and volume.  Mood is depressed with congruent, tearful affect.  Thought process is coherent, goal directed, and linear.  No indication that patient is responding to internal/external stimuli on exam.  No delusional thought content noted on exam.  Total Time spent with patient: 30 minutes  Psychiatric Specialty Exam:  Presentation  General Appearance: Appropriate for Environment; Well Groomed  Eye Contact:Good  Speech:Clear and Coherent; Normal Rate  Speech Volume:Normal  Handedness:No data recorded  Mood and Affect  Mood:Depressed  Affect:Congruent; Tearful   Thought Process  Thought Processes:Coherent; Goal Directed; Linear  Descriptions of Associations:Intact  Orientation:Full (Time, Place and Person)  Thought Content:Logical; WDL  History of Schizophrenia/Schizoaffective disorder:No data recorded Duration of Psychotic Symptoms:No data recorded Hallucinations:Hallucinations: -- (Patient denies AVH currently on exam, but does endorse history of AH for the past few months and history of VH  for the past few years (see HPI for details).)  Ideas of Reference:No data recorded Suicidal Thoughts:Suicidal Thoughts: -- (Patient denies SI currently on exam, but does endorse having recent active SI last night on the evening of 04/22/21 with intent and plan to attempt suicide by shooting herself with her boyfriend's gun (see HPI for details).)  Homicidal Thoughts:Homicidal Thoughts: No   Sensorium  Memory:Immediate Good; Recent Good; Remote Good  Judgment:Good  Insight:Good   Executive Functions  Concentration:Good  Attention Span:Good  Recall:Good  Fund of Knowledge:Good  Language:Good   Psychomotor Activity  Psychomotor Activity:Psychomotor Activity:  Normal   Assets  Assets:Communication Skills; Desire for Improvement; Financial Resources/Insurance; Housing; Leisure Time; Physical Health; Resilience; Social Support; Transportation; Vocational/Educational   Sleep  Sleep:Sleep: Poor Number of Hours of Sleep: 4    Physical Exam: Physical Exam Vitals reviewed.  Constitutional:      General: She is not in acute distress.    Appearance: She is not ill-appearing, toxic-appearing or diaphoretic.  HENT:     Head: Normocephalic and atraumatic.     Right Ear: External ear normal.     Left Ear: External ear normal.     Nose: Nose normal.  Eyes:     General:        Right eye: No discharge.        Left eye: No discharge.     Conjunctiva/sclera: Conjunctivae normal.     Comments: Patient wears glasses.   Cardiovascular:     Rate and Rhythm: Normal rate.  Pulmonary:     Effort: Pulmonary effort is normal. No respiratory distress.  Musculoskeletal:        General: Normal range of motion.     Cervical back: Normal range of motion.  Neurological:     General: No focal deficit present.     Mental Status: She is alert and oriented to person, place, and time.     Comments: No tremor noted.   Psychiatric:        Attention and Perception: Attention normal. She perceives auditory and visual hallucinations.        Mood and Affect: Mood is depressed.        Speech: Speech normal.        Behavior: Behavior is withdrawn. Behavior is not agitated, slowed, aggressive, hyperactive or combative. Behavior is cooperative.        Thought Content: Thought content is not paranoid or delusional. Thought content includes suicidal ideation. Thought content does not include homicidal ideation. Thought content includes suicidal plan.     Comments: Affect tearful and mood-congruent.    Review of Systems  Constitutional:  Positive for malaise/fatigue. Negative for chills, diaphoresis, fever and weight loss.  HENT:  Negative for congestion.   Respiratory:   Negative for cough and shortness of breath.   Cardiovascular:  Negative for chest pain and palpitations.  Gastrointestinal:  Negative for abdominal pain, constipation, diarrhea, nausea and vomiting.  Musculoskeletal:  Negative for joint pain and myalgias.  Neurological:  Negative for seizures, loss of consciousness and headaches.       Patient endorses history of POTS and intermittent lightheadedness and dizziness, which she takes Midodrine for.  Psychiatric/Behavioral:  Positive for depression, hallucinations and suicidal ideas. Negative for memory loss and substance abuse. The patient is nervous/anxious and has insomnia.   All other systems reviewed and are negative.  Vitals: Blood pressure 114/69, pulse 80, temperature 97.8 F (36.6 C), temperature source Oral, SpO2 100 %. There is no height or  weight on file to calculate BMI.  Musculoskeletal: Strength & Muscle Tone: within normal limits Gait & Station: normal Patient leans: N/A   Recommendations:  Based on my evaluation the patient does not appear to have an emergency medical condition.  Based on patient's current presentation, including symptoms of depression and recent active suicidal ideation with intent and plan to attempt suicide by shooting herself with her boyfriend's firearm (see HPI for details), as well as based on information obtained from patient's mother (see HPI for details), patient appears to be experiencing severe worsening of her depressive symptoms that are significantly negatively impacting her ability to function in her activities of daily living and patient appears to be a danger to herself at this time.  Thus, based on these factors, patient meets inpatient psychiatric treatment criteria at this time.  Recommend inpatient psychiatric treatment for the patient.  Patient is agreeable to inpatient psychiatric treatment. Per [facility], patient is conditionally accepted to [facility] pending negative PCR Flu A&B, COVID test. PCR  Flu A&, COVID test ordered for inpatient psychiatric admission and result is negative.  Thus, patient is officially accepted to [facility] for inpatient psychiatric treatment.  [facility] admission orders placed, which include the following:  Labs/tests (blood work to be drawn during [facility] AM draw on the morning of 05/03/2021, EKG to be conducted on the morning of 05/03/2021, urine specimen may be collected at any time):   -UDS  -Urine pregnancy  -CBC  -CMP  -Hemoglobin A1c  -Lipid panel  -TSH  -Lithium level (ordered due to patient being on lithium)  -EKG (ordered to check patient's QT/QTC due to patient being on antipsychotic medication)  Medications:  Will place orders to continue the following home medications at this time:  -Vraylar 6 mg p.o. daily at bedtime for bipolar disorder, AVH/psychotic features   -Patient and patient's mother report that the patient has been prescribed Caplyta 42 mg p.o. daily at bedtime as well, but patient and patient's mother report that the patient has not started taking the Caplyta yet because the patient has been instructed by her psychiatrist to taper off of her Vraylar prior to starting the Caplyta.  More specifically, patient and patient's mother report that the patient is supposed to be tapering her dosage of Vraylar down from 6 mg once daily to 3 mg once daily, by starting Vraylar 3 mg once daily x7 days, prior to starting the Caplyta, but patient and patient's mother report that the patient has been unable to start her Vraylar taper due to patient being unable to pick up prescription for Vraylar 3 mg from her pharmacy due to insurance issues.  Will defer to day shift psychiatry treatment team to discuss with the patient potential Vraylar taper and potential future initiation of Caplyta.  -Lithium carbonate CR 300 mg p.o. twice daily for bipolar disorder  -Synthroid 50 mcg p.o. daily every morning for hypothyroidism  -Midodrine 2.5 mg p.o. twice daily for dizziness and  lightheadedness, POTS  -Patient takes Simpesse 0.15-0.03 &0.01 mg tablet-1 tablet p.o. daily at bedtime at home for oral contraception.  Due to [facility] not having this medication/oral contraception on our formulary, patient's mother/boyfriend were instructed that patient's home birth control medication will need to be brought to [facility] so that patient can take her oral contraception while she is at [facility]. Order placed for formulary alternative (Levonorgestrel-Ethinyl Estradiol 0.15-0.03 &0.01 mg tablet-1 tablet p.o. daily at bedtime) and comment added to this order stating that this medication will be patient's home  supply of Simpesse.   Patient has taken trazodone in the past.  Discussed with the patient initiating trazodone as needed for sleep and Vistaril as needed for anxiety.  Patient educated on side effect profile of Vistaril.  After discussion with the patient, patient agreed to initiate trazodone as needed for sleep and Vistaril as needed for anxiety.  Orders placed for additional as needed medications:  -Vistaril 25 mg p.o. 3 times daily as needed for anxiety  -Trazodone 50 mg p.o. at bedtime as needed for sleep  -Tylenol 650 mg p.o. every 6 hours as needed for mild pain  -Maalox/Mylanta 30 mL p.o. every 4 hours as needed for indigestion  -Milk of Magnesia 30 mL p.o. daily as needed for mild constipation  Will defer to dayshift psychiatry team to discuss further psychotropic medication adjustments with the patient.  Jaclyn Shaggy, PA-C 05/03/2021, 3:18 AM
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
## 2021-05-03 NOTE — [facility] Group Notes (Signed)
The focus of this group is to help patients establish daily goals to achieve during treatment and discuss how the patient can incorporate goal setting into their daily lives to aide in recovery.  Pt attended goals group and stated that her goal was to get better so she could go home.

## 2021-05-04 DIAGNOSIS — F410 Panic disorder [episodic paroxysmal anxiety] without agoraphobia: Secondary | ICD-10-CM | POA: Diagnosis present

## 2021-05-04 MED ORDER — CARIPRAZINE HCL 1.5 MG PO CAPS
1.5000 mg | ORAL_CAPSULE | Freq: Every day | ORAL | Status: AC
  Administered 2021-05-04: 21:00:00 1.5 mg via ORAL
  Filled 2021-05-04: qty 1

## 2021-05-04 NOTE — Progress Notes (Signed)
°   05/04/21 2116  Psych Admission Type (Psych Patients Only)  Admission Status Voluntary  Psychosocial Assessment  Patient Complaints Anxiety  Eye Contact Fair  Facial Expression Anxious;Animated  Affect Appropriate to circumstance  Speech Logical/coherent;Soft  Interaction Assertive  Motor Activity Slow  Appearance/Hygiene Unremarkable  Behavior Characteristics Cooperative;Appropriate to situation  Mood Anxious;Depressed  Thought Process  Coherency WDL  Content WDL  Delusions None reported or observed  Perception WDL  Hallucination None reported or observed  Judgment Impaired  Confusion None  Danger to Self  Current suicidal ideation? Passive  Self-Injurious Behavior No self-injurious ideation or behavior indicators observed or expressed   Agreement Not to Harm Self Yes  Description of Agreement Verbal contract  Danger to Others  Danger to Others None reported or observed
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
## 2021-05-04 NOTE — Progress Notes (Signed)
[facility] Group Notes:  (Nursing/MHT/Case Management/Adjunct)  Date:  05/04/2021  Time:  2015 Type of Therapy:   wrap up group  Participation Level:  Active  Participation Quality:  Appropriate, Attentive, Sharing, and Supportive  Affect:  Appropriate  Cognitive:  Alert  Insight:  Improving  Engagement in Group:  Engaged  Modes of Intervention:  Clarification, Education, and Support  Summary of Progress/Problems: Positive thinking and positive change were discussed.   Sabrina Bennett 05/04/2021, 9:03 PM

## 2021-05-04 NOTE — Progress Notes (Addendum)
[facility] MD Progress Note  05/04/2021 1:17 PM Sabrina Bennett  MRN:  [id number] Subjective:  Sabrina Bennett is a 20 year old female with a reported psychiatric history of bipolar disorder, depression, GAD, OCD, ADHD- inattentive type, ARFID, and binge-eating disorder, as well as a medical history of hypothyroidism and presumed POTS who presented to [facility] as a walk-in and admitted voluntarily for worsening depression and SI with plan to shoot herself.   Chart Review of Past 24 hrs: The patient's chart was reviewed and nursing notes were reviewed. The patient's case was discussed in multidisciplinary team meeting.  Per MAR: - Patient is compliant with scheduled meds. - PRNs: Tylenol x1, Hydroxyzine x 1, Trazodone x 1 Per RN notes, no documented behavioral issues and is attending group. Patient sleep hours not documented  Patient had the following psychiatric recommendations yesterday:  -- Initiate Zyprexa 5 mg qHS for mood stabilization and psychotic features -- Initiate Prozac 10 mg daily for depression -- Increase home Lithium to 450 mg BID -- Tapering home Vraylar: 3 mg daily x 2 doses, 1.5 mg daily x 2 doses, then discontinue -- Initiate Metformin 500 mg daily with breakfast for antipsychotic-induced weight gain.    On Today's Assessment (05/04/2021): Case was discussed in the multidisciplinary team. MAR was reviewed and patient was compliant with medications. Patient seen, assessed, and discussed with attending Dr. Sherron Flemings.  She reports that she is still depressed and down today. she reports some improvements in her sleep with trazodone and zyprexa, and her appetite is somewhat improved (not restrictive nor binging). She informs that she became dizzy while having her vitals obtained this morning, leading to her vision "going black" for a short period of time. She reports the onset of her menstrual cycle, stating that these spells appear to be worsened by her cycle. She also reports some nausea this  morning, but denies all other somatic symptoms.  She reports having passive suicidal thoughts, off and on, but remains able to contract for safety on the unit. She denies HI, AVH, first rank symptoms, ideas of reference, or paranoia on the unit.  She completed her YBOCS assessment, in which she scored 16 for obsessions, and 4 for compulsions.  She also completed a portion of the homework assignment given to write down 10 positive self-attributes and 10 strengths, that she found difficult to complete. We discussed tapering off Vraylar and adding in topiramate as tolerated, and patient was agreeable with the plan.  Pt reports that anxiety level is very elevated, 9/10.  We discussed continuing prozac and zyprexa at current doses, as we decrease vraylar to discontinuation, then we can increase fluoxetine, add buspar, or start gabapentin, if needed in the future for treatment of elevated anxiety level.   Principal Problem: Bipolar 1 disorder, depressed, severe (HCC) Diagnosis: Principal Problem:   Bipolar 1 disorder, depressed, severe (HCC) Active Problems:   Generalized anxiety disorder   POTS (postural orthostatic tachycardia syndrome)   Subclinical hypothyroidism   PTSD (post-traumatic stress disorder)   OCD (obsessive compulsive disorder)   Panic disorder  Total Time spent with patient: 30 minutes  Past Psychiatric History: See H&P  Past Medical History: See H&P Family History: See H&P Family Psychiatric  History: See H&P Social History:  Social History   Substance and Sexual Activity  Alcohol Use Yes     Social History   Substance and Sexual Activity  Drug Use Never    Social History   Socioeconomic History   Marital status: Single  Spouse name: Not on file   Number of children: Not on file   Years of education: Not on file   Highest education level: Not on file  Occupational History   Not on file  Tobacco Use   Smoking status: Never    Passive exposure: Never    Smokeless tobacco: Never  Vaping Use   Vaping Use: Never used  Substance and Sexual Activity   Alcohol use: Yes   Drug use: Never   Sexual activity: Yes    Birth control/protection: Pill, Condom  Other Topics Concern   Not on file  Social History Narrative   Not on file   Social Determinants of Health   Financial Resource Strain: Not on file  Food Insecurity: Not on file  Transportation Needs: Not on file  Physical Activity: Not on file  Stress: Not on file  Social Connections: Not on file   Additional Social History:    Pain Medications: See MAR Prescriptions: See MAR Over the Counter: See MAR          Sleep: Fair  Appetite:  Fair, improving  Current Medications: Current Facility-Administered Medications  Medication Dose Route Frequency Provider Last Rate Last Admin   acetaminophen (TYLENOL) tablet 650 mg  650 mg Oral Q6H PRN Jaclyn Shaggy, PA-C   650 mg at 05/03/21 1124   alum & mag hydroxide-simeth (MAALOX/MYLANTA) 200-200-20 MG/5ML suspension 30 mL  30 mL Oral Q4H PRN Melbourne Abts W, PA-C       cariprazine (VRAYLAR) capsule 1.5 mg  1.5 mg Oral Daily Lamar Sprinkles, MD       feeding supplement (ENSURE ENLIVE / ENSURE PLUS) liquid 237 mL  237 mL Oral BID BM Ladona Ridgel, Cody W, PA-C   237 mL at 05/04/21 1102   FLUoxetine (PROZAC) capsule 10 mg  10 mg Oral Daily Corita Allinson, Harrold Donath, MD   10 mg at 05/04/21 1102   hydrOXYzine (ATARAX) tablet 25 mg  25 mg Oral TID PRN Jaclyn Shaggy, PA-C   25 mg at 05/03/21 2112   Levonorgestrel-Ethinyl Estradiol (AMETHIA) 0.15-0.03 &0.01 MG tablet 1 tablet  1 tablet Oral QHS Jaclyn Shaggy, PA-C   1 tablet at 05/03/21 2112   levothyroxine (SYNTHROID) tablet 50 mcg  50 mcg Oral Daily Melbourne Abts W, PA-C   50 mcg at 05/04/21 1101   lithium carbonate (ESKALITH) CR tablet 450 mg  450 mg Oral BID Galit Urich, Harrold Donath, MD   450 mg at 05/04/21 1101   magnesium hydroxide (MILK OF MAGNESIA) suspension 30 mL  30 mL Oral Daily PRN Melbourne Abts W, PA-C        metFORMIN (GLUCOPHAGE) tablet 500 mg  500 mg Oral Q breakfast Cosby, Toni Amend, MD   500 mg at 05/04/21 1101   midodrine (PROAMATINE) tablet 2.5 mg  2.5 mg Oral BID Melbourne Abts W, PA-C   2.5 mg at 05/04/21 1102   OLANZapine (ZYPREXA) tablet 5 mg  5 mg Oral QHS Alyssabeth Bruster, MD   5 mg at 05/03/21 2112   traZODone (DESYREL) tablet 50 mg  50 mg Oral QHS PRN Jaclyn Shaggy, PA-C   50 mg at 05/03/21 2112    Lab Results:  Results for orders placed or performed during the hospital encounter of 05/03/21 (from the past 48 hour(s))  Resp Panel by RT-PCR (Flu A&B, Covid) Nasopharyngeal Swab     Status: None   Collection Time: 05/03/21  2:04 AM   Specimen: Nasopharyngeal Swab; Nasopharyngeal(NP) swabs in vial transport medium  Result Value Ref Range   SARS Coronavirus 2 by RT PCR NEGATIVE NEGATIVE    Comment: (NOTE) SARS-CoV-2 target nucleic acids are NOT DETECTED.  The SARS-CoV-2 RNA is generally detectable in upper respiratory specimens during the acute phase of infection. The lowest concentration of SARS-CoV-2 viral copies this assay can detect is 138 copies/mL. A negative result does not preclude SARS-Cov-2 infection and should not be used as the sole basis for treatment or other patient management decisions. A negative result may occur with  improper specimen collection/handling, submission of specimen other than nasopharyngeal swab, presence of viral mutation(s) within the areas targeted by this assay, and inadequate number of viral copies(<138 copies/mL). A negative result must be combined with clinical observations, patient history, and epidemiological information. The expected result is Negative.  Fact Sheet for Patients:  BloggerCourse.com  Fact Sheet for Healthcare Providers:  SeriousBroker.it  This test is no t yet approved or cleared by the [place] FDA and  has been authorized for detection and/or diagnosis of  SARS-CoV-2 by FDA under an Emergency Use Authorization (EUA). This EUA will remain  in effect (meaning this test can be used) for the duration of the COVID-19 declaration under Section 564(b)(1) of the Act, 21 U.S.C.section 360bbb-3(b)(1), unless the authorization is terminated  or revoked sooner.       Influenza A by PCR NEGATIVE NEGATIVE   Influenza B by PCR NEGATIVE NEGATIVE    Comment: (NOTE) The Xpert Xpress SARS-CoV-2/FLU/RSV plus assay is intended as an aid in the diagnosis of influenza from Nasopharyngeal swab specimens and should not be used as a sole basis for treatment. Nasal washings and aspirates are unacceptable for Xpert Xpress SARS-CoV-2/FLU/RSV testing.  Fact Sheet for Patients: BloggerCourse.com  Fact Sheet for Healthcare Providers: SeriousBroker.it  This test is not yet approved or cleared by the [place] FDA and has been authorized for detection and/or diagnosis of SARS-CoV-2 by FDA under an Emergency Use Authorization (EUA). This EUA will remain in effect (meaning this test can be used) for the duration of the COVID-19 declaration under Section 564(b)(1) of the Act, 21 U.S.C. section 360bbb-3(b)(1), unless the authorization is terminated or revoked.  Performed at [facility], [address] [place], [place] [address]   Pregnancy, urine     Status: None   Collection Time: 05/03/21  6:30 AM  Result Value Ref Range   Preg Test, Ur NEGATIVE NEGATIVE    Comment:        THE SENSITIVITY OF THIS METHODOLOGY IS >20 mIU/mL. Performed at [facility], [address] [place], [place] [address]   Urine rapid drug screen (hosp performed)not at [facility]     Status: None   Collection Time: 05/03/21  6:30 AM  Result Value Ref Range   Opiates NONE DETECTED NONE DETECTED   Cocaine NONE DETECTED NONE DETECTED   Benzodiazepines NONE DETECTED NONE DETECTED   Amphetamines NONE  DETECTED NONE DETECTED   Tetrahydrocannabinol NONE DETECTED NONE DETECTED   Barbiturates NONE DETECTED NONE DETECTED    Comment: (NOTE) DRUG SCREEN FOR MEDICAL PURPOSES ONLY.  IF CONFIRMATION IS NEEDED FOR ANY PURPOSE, NOTIFY LAB WITHIN 5 DAYS.  LOWEST DETECTABLE LIMITS FOR URINE DRUG SCREEN Drug Class                     Cutoff (ng/mL) Amphetamine and metabolites    1000 Barbiturate and metabolites    200 Benzodiazepine  200 Tricyclics and metabolites     300 Opiates and metabolites        300 Cocaine and metabolites        300 THC                            50 Performed at [facility], [address], [place], [place] [address]   CBC     Status: None   Collection Time: 05/03/21  6:34 AM  Result Value Ref Range   WBC 8.8 4.0 - 10.5 K/uL   RBC 4.27 3.87 - 5.11 MIL/uL   Hemoglobin 13.2 12.0 - 15.0 g/dL   HCT 46.9 62.9 - 52.8 %   MCV 95.6 80.0 - 100.0 fL   MCH 30.9 26.0 - 34.0 pg   MCHC 32.4 30.0 - 36.0 g/dL   RDW 41.3 24.4 - 01.0 %   Platelets 207 150 - 400 K/uL   nRBC 0.0 0.0 - 0.2 %    Comment: Performed at [facility], [address], [place], [place] [address]  Comprehensive metabolic panel     Status: Abnormal   Collection Time: 05/03/21  6:34 AM  Result Value Ref Range   Sodium 138 135 - 145 mmol/L   Potassium 4.1 3.5 - 5.1 mmol/L   Chloride 106 98 - 111 mmol/L   CO2 26 22 - 32 mmol/L   Glucose, Bld 114 (H) 70 - 99 mg/dL    Comment: Glucose reference range applies only to samples taken after fasting for at least 8 hours.   BUN 14 6 - 20 mg/dL   Creatinine, Ser 6.64 (H) 0.44 - 1.00 mg/dL   Calcium 9.2 8.9 - 40.3 mg/dL   Total Protein 6.8 6.5 - 8.1 g/dL   Albumin 3.9 3.5 - 5.0 g/dL   AST 18 15 - 41 U/L   ALT 11 0 - 44 U/L   Alkaline Phosphatase 55 38 - 126 U/L   Total Bilirubin 0.3 0.3 - 1.2 mg/dL   GFR, Estimated >47 >42 mL/min    Comment: (NOTE) Calculated using the CKD-EPI Creatinine Equation (2021)     Anion gap 6 5 - 15    Comment: Performed at [facility], [address] [place], [place] [address]  Hemoglobin A1c     Status: None   Collection Time: 05/03/21  6:34 AM  Result Value Ref Range   Hgb A1c MFr Bld 5.0 4.8 - 5.6 %    Comment: (NOTE) Pre diabetes:          5.7%-6.4%  Diabetes:              >6.4%  Glycemic control for   <7.0% adults with diabetes    Mean Plasma Glucose 96.8 mg/dL    Comment: Performed at [facility], [address], [place], [place] [address]  Lipid panel     Status: None   Collection Time: 05/03/21  6:34 AM  Result Value Ref Range   Cholesterol 140 0 - 200 mg/dL   Triglycerides 47 <332 mg/dL   HDL 47 >95 mg/dL   Total CHOL/HDL Ratio 3.0 RATIO   VLDL 9 0 - 40 mg/dL   LDL Cholesterol 84 0 - 99 mg/dL    Comment:        Total Cholesterol/HDL:CHD Risk Coronary Heart Disease Risk Table  Men   Women  1/2 Average Risk   3.4   3.3  Average Risk       5.0   4.4  2 X Average Risk   9.6   7.1  3 X Average Risk  23.4   11.0        Use the calculated Patient Ratio above and the CHD Risk Table to determine the patient's CHD Risk.        ATP III CLASSIFICATION (LDL):  <100     mg/dL   Optimal  161-096  mg/dL   Near or Above                    Optimal  130-159  mg/dL   Borderline  045-409  mg/dL   High  >811     mg/dL   Very High Performed at [facility], [address] [place], [place] [address]   TSH     Status: Abnormal   Collection Time: 05/03/21  6:34 AM  Result Value Ref Range   TSH 6.815 (H) 0.350 - 4.500 uIU/mL    Comment: Performed by a 3rd Generation assay with a functional sensitivity of <=0.01 uIU/mL. Performed at [facility], [address] [place], [place] [address]   Lithium level     Status: Abnormal   Collection Time: 05/03/21  6:34 AM  Result Value Ref Range   Lithium Lvl 0.17 (L) 0.60 - 1.20 mmol/L    Comment: Performed at [facility], [address], [place], [place] [address]    Blood Alcohol level:  No results found for: [facility]  Metabolic Disorder Labs: Lab Results  Component Value Date   HGBA1C 5.0 05/03/2021   MPG 96.8 05/03/2021   No results found for: PROLACTIN Lab Results  Component Value Date   CHOL 140 05/03/2021   TRIG 47 05/03/2021   HDL 47 05/03/2021   CHOLHDL 3.0 05/03/2021   VLDL 9 05/03/2021   LDLCALC 84 05/03/2021    Physical Findings:  Musculoskeletal: Strength & Muscle Tone: within normal limits Gait & Station: normal Patient leans: N/A  Psychiatric Specialty Exam:  Presentation  General Appearance: Appropriate for Environment; Casual; Fairly Groomed  Eye Contact:Good  Speech:Clear and Coherent; Normal Rate  Speech Volume:Normal  Handedness:No data recorded  Mood and Affect  Mood:Anxious; Depressed  Affect:Congruent; Depressed (Anxious)   Thought Process  Thought Processes:Coherent; Goal Directed; Linear  Descriptions of Associations:Intact  Orientation:Full (Time, Place and Person)  Thought Content:Logical  History of Schizophrenia/Schizoaffective disorder:No data recorded Duration of Psychotic Symptoms:No data recorded Hallucinations:Hallucinations: None (Denies today)  Ideas of Reference:None  Suicidal Thoughts:Suicidal Thoughts: reports passive suicidal thoughts, off and on  Homicidal Thoughts:Homicidal Thoughts: No   Sensorium  Memory:Immediate Good; Recent Good  Judgment:Fair  Insight:Good   Executive Functions  Concentration:Good  Attention Span:Good  Recall:Good  Fund of Knowledge:Good  Language:Good   Psychomotor Activity  Psychomotor Activity:Psychomotor Activity: Normal   Assets  Assets:Communication Skills; Desire for Improvement; Financial Resources/Insurance; Housing; Intimacy; Leisure Time; Physical Health; Resilience; Social Support; Transportation; Vocational/Educational   Sleep  Sleep:Sleep: Good  (Reports good quality with Trazodone) Number of Hours of Sleep: 0 (Not documented)    Physical Exam: Physical Exam Vitals and nursing note reviewed.  Constitutional:      General: She is not in acute distress.    Appearance: Normal appearance. She is not toxic-appearing.  HENT:     Head: Normocephalic and atraumatic.  Mouth/Throat:     Mouth: Mucous membranes are moist.     Pharynx: Oropharynx is clear.  Pulmonary:     Effort: Pulmonary effort is normal.  Skin:    General: Skin is warm and dry.  Neurological:     General: No focal deficit present.     Mental Status: She is alert and oriented to person, place, and time.     Motor: No weakness.     Gait: Gait normal.   Review of Systems  Constitutional:  Negative for chills, fever and weight loss.  HENT:  Negative for congestion.   Eyes:  Positive for blurred vision.       Vision completely dissipated while having vitals obtained.   Respiratory:  Negative for shortness of breath.   Cardiovascular:  Negative for chest pain.  Gastrointestinal:  Positive for nausea. Negative for abdominal pain.  Genitourinary: Negative.        Patient started menstrual cycle 12/29  Musculoskeletal: Negative.   Neurological:  Positive for dizziness. Negative for headaches.   Blood pressure 105/71, pulse 88, temperature 97.6 F (36.4 C), temperature source Oral, resp. rate 16, height 5\' 8"  (1.727 m), weight 89.6 kg, last menstrual period 03/02/2021, SpO2 100 %. Body mass index is 30.04 kg/m.   ASSESSMENT: Principal Problem:   Bipolar 1 disorder, depressed, severe (HCC) Active Problems:   Generalized anxiety disorder   POTS (postural orthostatic tachycardia syndrome)   Subclinical hypothyroidism   PTSD (post-traumatic stress disorder)   OCD (obsessive compulsive disorder)   Panic disorder      Treatment Plan Summary: Daily contact with patient to assess and evaluate symptoms and progress in treatment and Medication management      Safety and Monitoring: voluntarily admission to inpatient psychiatric unit for safety, stabilization and treatment Daily contact with patient to assess and evaluate symptoms and progress in treatment Patient's case to be discussed in multi-disciplinary team meeting Observation Level : q15 minute checks Vital signs: q12 hours Precautions: suicide, elopement, and assault   2. Psychiatric Problems #Bipolar 1 disorder #GAD #PTSD #Panic Disorder #OCD OCD dx determined with YBOCS: Obsessions 16, compulsions 14, total 30= severe. -- Continue Zyprexa 5 mg qHS for mood stabilization and psychotic features; plan to increase as tolerated (slow adjustment d/t patient h/o POTS)             Lipid Panel WNL, A1c 5.0%, Qtc 425 -- Continue Prozac 10 mg daily for depression; plan to increase as tolerated -- Continue Lithium 450 mg BID             Lithium level 0.17; Cr 1.02 (12/28) -- DECREASE Vraylar from 3 mg daily to 1.5 mg once daily x 1 dose and 1.5 mg x 1 dose tonight, then discontinue.  -- Continue Metformin 500 mg daily with breakfast for antipsychotic-induced weight gain.              LFTs WNL, A1c 5.0% -- We will likely start topiramate in 1-2 days, for anxiety, OCD, PTSD, binge eating disorder symptoms, and to mitigate metabolic s/e of antipsychotic medication.       3. Medical Management Covid negative CMP: Cr 1.02 CBC: unremarkable EtOH: <10 UDS: Negative TSH: 6.815 A1C: 5.0% Lipids: WNL     #Subclinical hypothyroidism -- Continue home Levothyroxine 50 mcg daily   #POTS -- Continue home Midodrine 2.5 mg BID  4. Discharge Planning:              -- Social work and case management to  assist with discharge planning and identification of hospital follow-up needs prior to discharge.              -- Estimated LOS: 5-7 days             -- Discharge Concerns: Need to establish a safety plan; Medication compliance and effectiveness             -- Discharge Goals: Return home with  outpatient referrals for mental health follow-up including medication management/psychotherapy   Lamar Sprinkles, MD 05/04/2021, 1:17 PM  Total Time Spent in Direct Patient Care:  I personally spent 30 minutes on the unit in direct patient care. The direct patient care time included face-to-face time with the patient, reviewing the patient's chart, communicating with other professionals, and coordinating care. Greater than 50% of this time was spent in counseling or coordinating care with the patient regarding goals of hospitalization, psycho-education, and discharge planning needs.  I have independently evaluated the patient during a face-to-face assessment on 05/04/21. I reviewed the patient's chart, and I participated in key portions of the service. I discussed the case with the [employer], and I agree with the assessment and plan of care as documented in the House Officer's note, as addended by me or notated below:  I directly edited the note, as above.   Phineas Inches, MD Psychiatrist

## 2021-05-04 NOTE — Progress Notes (Signed)
Patient denies SI, HI and AVH this shift. Patient has been compliant with medications and group this shift.   Assess patient for safety, offer medication as prescribed, engage patient in 1:1 staff talks.   Continue to monitor as planned. Patient able to contract for safety.

## 2021-05-04 NOTE — Group Note (Signed)
Date:  05/04/2021 Time:  1:49 PM  Group Topic/Focus:  Goals Group:   The focus of this group is to help patients establish daily goals to achieve during treatment and discuss how the patient can incorporate goal setting into their daily lives to aide in recovery.    Participation Level:  Did Not Attend  Participation Quality:    Affect:    Cognitive:    Insight:   Engagement in Group:    Modes of Intervention:    Additional Comments:  Pt did not attend group.  [name] 05/04/2021, 1:49 PM

## 2021-05-05 MED ORDER — FLUOXETINE HCL 20 MG PO CAPS
20.0000 mg | ORAL_CAPSULE | Freq: Every day | ORAL | Status: DC
  Administered 2021-05-06 – 2021-05-08 (×3): 20 mg via ORAL
  Filled 2021-05-05 (×4): qty 1

## 2021-05-05 MED ORDER — ONDANSETRON 4 MG PO TBDP
4.0000 mg | ORAL_TABLET | Freq: Three times a day (TID) | ORAL | Status: DC | PRN
  Administered 2021-05-05: 11:00:00 4 mg via ORAL
  Filled 2021-05-05: qty 1

## 2021-05-05 NOTE — Group Note (Signed)
Recreation Therapy Group Note   Group Topic:Stress Management  Group Date: 05/05/2021 Start Time: 0940 End Time: 0950 Facilitators: Caroll Rancher, LRT,CTRS Location: [address] Dayroom   Goal Area(s) Addresses:  Patient will actively participate in stress management techniques presented during session.  Patient will successfully identify benefit of practicing stress management post d/c.   Group Description: Meditation. LRT played a meditation that focused on new horizons and being open to new things/experiences in the coming year.  Patients were to listen and follow along as meditation played to fully engage in activity.  Patients were given suggestions on using the Internet and Apps to locate meditations.   Affect/Mood: Appropriate   Participation Level: Engaged   Participation Quality: Independent   Behavior: Appropriate   Speech/Thought Process: Focused   Insight: Good   Judgement: Good   Modes of Intervention: Meditation   Patient Response to Interventions:  Engaged   Education Outcome:  Acknowledges education and In group clarification offered    Clinical Observations/Individualized Feedback: Pt attended and participated in group.    Plan: Continue to engage patient in RT group sessions 2-3x/week.   Caroll Rancher, LRT,CTRS  05/05/2021 11:49 AM

## 2021-05-05 NOTE — Progress Notes (Addendum)
[facility] MD Progress Note  05/05/2021 2:32 PM Sabrina Bennett  MRN:  [id number] Subjective:   Sabrina Bennett is a 20 yr old female who presented for worsening depression and SI with plan to shoot herself.  PPHx is significant for Bipolar Disorder, Depression, GAD, OCD, ADHD- inattentive type, ARFID, and binge-eating Disorder.   Case was discussed in the multidisciplinary team. MAR was reviewed and patient was compliant with medications.  She required Atarax and Trazodone PRN's last night.   Psychiatric Team made the following recommendations yesterday: -- Continue Zyprexa 5 mg qHS for mood stabilization and psychotic features -- Continue Prozac 10 mg daily for depression; plan to increase as tolerated -- Continue Lithium 450 mg BID -- DECREASE Vraylar from 3 mg daily to 1.5 mg once daily x 1 dose and 1.5 mg x 1 dose tonight, then discontinue.  -- Continue Metformin 500 mg daily with breakfast for antipsychotic-induced weight gain.  -- We will likely start topiramate in 1-2 days, for anxiety, OCD, PTSD, binge eating disorder symptoms, and to mitigate metabolic s/e of antipsychotic medication.    On interview today patient reports that she has some improvement and feels less depressed and sad. She reports that her depression is a 4 out of 10 (10 being the worst, on admission was 8/9).  She reports that her anxiety is also improved to a 6 out of 10 (10 being the worst, on admission was 9/10).   She reports that her sleep is improved as she was able to sleep through the night while prior to admission would only be able to sleep at most 4 hours.  She reports that her appetite is improving and she was able to completely finish her breakfast this morning.  She states 'i don't actually have suicidal thoughts today.' She denies HI, AVH, paranoia.  She reports improvement with her appetite but does report that she began to have a little bit of nausea about 15 minutes prior to interview.  Discussed  starting Zofran as needed so that if she has further episodes of nausea they can be treated.  She reports that she feels like her medications are "kicking in" as she feels improved.    Discussed that with the increase in the lithium we will need to recheck a level on Monday to ensure within therapeutic range and she was agreeable to this.  She reports that she has had no further dizzy spells since yesterday morning when getting her vitals.  She does report some chronic constipation and reports her last BM was yesterday.  Discussed with her if this continues to be an issue she does have medication on order to help with this and we could make further medication recommendations if it persists.  She reports no other concerns at present.   Principal Problem: Bipolar 1 disorder, depressed, severe (HCC) Diagnosis: Principal Problem:   Bipolar 1 disorder, depressed, severe (HCC) Active Problems:   Generalized anxiety disorder   POTS (postural orthostatic tachycardia syndrome)   Subclinical hypothyroidism   PTSD (post-traumatic stress disorder)   OCD (obsessive compulsive disorder)   Panic disorder  Total Time spent with patient: 30 minutes  Past Psychiatric History: Bipolar Disorder, Depression, GAD, OCD, ADHD- inattentive type, ARFID, and binge-eating Disorder.  Past Medical History: History reviewed. No pertinent past medical history. History reviewed. No pertinent surgical history. Family History: History reviewed. No pertinent family history. Family Psychiatric  History: Mother - ADHD Father - EtOH use disorder Older Brother - Depression and Anxiety Younger Brother -  ADHD Social History:  Social History   Substance and Sexual Activity  Alcohol Use Yes     Social History   Substance and Sexual Activity  Drug Use Never    Social History   Socioeconomic History   Marital status: Single    Spouse name: Not on file   Number of children: Not on file   Years of education: Not on file    Highest education level: Not on file  Occupational History   Not on file  Tobacco Use   Smoking status: Never    Passive exposure: Never   Smokeless tobacco: Never  Vaping Use   Vaping Use: Never used  Substance and Sexual Activity   Alcohol use: Yes   Drug use: Never   Sexual activity: Yes    Birth control/protection: Pill, Condom  Other Topics Concern   Not on file  Social History Narrative   Not on file   Social Determinants of Health   Financial Resource Strain: Not on file  Food Insecurity: Not on file  Transportation Needs: Not on file  Physical Activity: Not on file  Stress: Not on file  Social Connections: Not on file   Additional Social History:    Pain Medications: See MAR Prescriptions: See MAR Over the Counter: See MAR                    Sleep: Good  Appetite:  Good  Current Medications: Current Facility-Administered Medications  Medication Dose Route Frequency Provider Last Rate Last Admin   acetaminophen (TYLENOL) tablet 650 mg  650 mg Oral Q6H PRN Jaclyn Shaggy, PA-C   650 mg at 05/03/21 1124   alum & mag hydroxide-simeth (MAALOX/MYLANTA) 200-200-20 MG/5ML suspension 30 mL  30 mL Oral Q4H PRN Melbourne Abts W, PA-C       feeding supplement (ENSURE ENLIVE / ENSURE PLUS) liquid 237 mL  237 mL Oral BID BM Ladona Ridgel, Cody W, PA-C   237 mL at 05/05/21 1048   [START ON 05/06/2021] FLUoxetine (PROZAC) capsule 20 mg  20 mg Oral Daily Jaquan Sadowsky, MD       hydrOXYzine (ATARAX) tablet 25 mg  25 mg Oral TID PRN Jaclyn Shaggy, PA-C   25 mg at 05/04/21 2116   Levonorgestrel-Ethinyl Estradiol (AMETHIA) 0.15-0.03 &0.01 MG tablet 1 tablet  1 tablet Oral QHS Jaclyn Shaggy, PA-C   1 tablet at 05/04/21 2115   levothyroxine (SYNTHROID) tablet 50 mcg  50 mcg Oral Daily Melbourne Abts W, PA-C   50 mcg at 05/05/21 8341   lithium carbonate (ESKALITH) CR tablet 450 mg  450 mg Oral BID Phineas Inches, MD   450 mg at 05/05/21 0827   magnesium hydroxide (MILK OF  MAGNESIA) suspension 30 mL  30 mL Oral Daily PRN Melbourne Abts W, PA-C       metFORMIN (GLUCOPHAGE) tablet 500 mg  500 mg Oral Q breakfast Lamar Sprinkles, MD   500 mg at 05/05/21 0827   midodrine (PROAMATINE) tablet 2.5 mg  2.5 mg Oral BID Melbourne Abts W, PA-C   2.5 mg at 05/05/21 9622   OLANZapine (ZYPREXA) tablet 5 mg  5 mg Oral QHS Evian Derringer, MD   5 mg at 05/04/21 2115   ondansetron (ZOFRAN-ODT) disintegrating tablet 4 mg  4 mg Oral Q8H PRN Taleigha Pinson, Harrold Donath, MD   4 mg at 05/05/21 1121   traZODone (DESYREL) tablet 50 mg  50 mg Oral QHS PRN Jaclyn Shaggy, PA-C   50  mg at 05/04/21 2116    Lab Results: No results found for this or any previous visit (from the past 48 hour(s)).  Blood Alcohol level:  No results found for: [facility]  Metabolic Disorder Labs: Lab Results  Component Value Date   HGBA1C 5.0 05/03/2021   MPG 96.8 05/03/2021   No results found for: PROLACTIN Lab Results  Component Value Date   CHOL 140 05/03/2021   TRIG 47 05/03/2021   HDL 47 05/03/2021   CHOLHDL 3.0 05/03/2021   VLDL 9 05/03/2021   LDLCALC 84 05/03/2021    Physical Findings: AIMS:  , ,  ,  ,    CIWA:    COWS:     Musculoskeletal: Strength & Muscle Tone: within normal limits Gait & Station: normal Patient leans: N/A  Psychiatric Specialty Exam:  Presentation  General Appearance: Appropriate for Environment; Casual; Fairly Groomed  Eye Contact:Good (minimal blinking)  Speech:Clear and Coherent; Normal Rate  Speech Volume:Normal  Handedness:No data recorded  Mood and Affect  Mood:Anxious; Depressed (improving)  Affect:Congruent; Depressed   Thought Process  Thought Processes:Coherent; Goal Directed  Descriptions of Associations:Intact  Orientation:Full (Time, Place and Person)  Thought Content:Logical  History of Schizophrenia/Schizoaffective disorder:No data recorded Duration of Psychotic Symptoms:No data recorded Hallucinations:Hallucinations: None  Ideas of  Reference:None  Suicidal Thoughts:Suicidal Thoughts: No  Homicidal Thoughts:Homicidal Thoughts: No   Sensorium  Memory:Immediate Good; Recent Good  Judgment:Fair  Insight:Good   Executive Functions  Concentration:Good  Attention Span:Good  Recall:Good  Fund of Knowledge:Good  Language:Good   Psychomotor Activity  Psychomotor Activity:Psychomotor Activity: Normal   Assets  Assets:Communication Skills; Desire for Improvement; Physical Health; Resilience; Housing; Tax adviser; Intimacy; Financial Resources/Insurance; Leisure Time; Social Support   Sleep  Sleep:Sleep: Good Number of Hours of Sleep: 0 (Not documented)    Physical Exam: Physical Exam Vitals and nursing note reviewed.  Constitutional:      General: She is not in acute distress.    Appearance: Normal appearance. She is normal weight. She is not ill-appearing or toxic-appearing.  HENT:     Head: Normocephalic and atraumatic.  Pulmonary:     Effort: Pulmonary effort is normal.  Musculoskeletal:        General: Normal range of motion.  Neurological:     General: No focal deficit present.     Mental Status: She is alert.   Review of Systems  Respiratory:  Negative for cough and shortness of breath.   Cardiovascular:  Negative for chest pain.  Gastrointestinal:  Positive for constipation (Chronic had BM yesterday) and nausea (mild). Negative for abdominal pain, diarrhea and vomiting.  Neurological:  Negative for weakness and headaches.  Psychiatric/Behavioral:  Positive for depression (improving). Negative for hallucinations and suicidal ideas. The patient is nervous/anxious (improving).    Blood pressure (!) 137/127, pulse (!) 109, temperature (!) 97.3 F (36.3 C), temperature source Oral, resp. rate 16, height 5\' 8"  (1.727 m), weight 89.6 kg, last menstrual period 03/02/2021, SpO2 (!) 80 %. Body mass index is 30.04 kg/m.   Treatment Plan Summary: Daily contact with  patient to assess and evaluate symptoms and progress in treatment and Medication management  Sabrina Bennett is a 20 yr old female who presented for worsening depression and SI with plan to shoot herself.  PPHx is significant for Bipolar Disorder, Depression, GAD, OCD, ADHD- inattentive type, ARFID, and binge-eating Disorder.   Sabrina Bennett appears to be making improvements.  As several medication changes were made yesterday we will not make any medication changes today.  We will plan for an increase in Prozac tomorrow.  We will consider starting topiramate tomorrow to help with her anxiety, binge eating, and metabolic side effects of her Zyprexa.  Since she is having some nausea and past issues with compliance due to perceived side effects we will start PRN Zofran to ensure any nausea is addressed.  We will recheck a lithium level, BMP, and TSH on Monday 1/1.  We will continue to monitor.   Bipolar 1 Disorder   GAD   PTSD   Panic Disorder   OCD: -Continue Zyprexa 5 mg QHS -Increase Prozac to 20 mg on tomorrow - for mood, anxiety, ptsd, ocd -Continue Lithium 450 mg BID  -Re-check Lithium Level and BMP on 1/1 -Continue Metformin 500 mg daily with breakfast  -Vraylar taper finished 12/29 -OCD dx determined with YBOCS: Obsessions 16, compulsions 14, total 30= severe.   Subclinical hypothyroidism: -Continue home Levothyroxine 50 mcg daily -TSH (12/28): 6.815 -Redraw TSH on 1/1 -Follow up with PCP on discharge   Nausea: -Start Zofran ODT 4 mg q8 PRN   POTS: -Continue home Midodrine 2.5 mg BID   -Continue PRN's: Tylenol, Maalox, Atarax, Milk of Magnesia, Trazodone   Sabrina Franklin, MD 05/05/2021, 2:32 PM  Total Time Spent in Direct Patient Care:  I personally spent 30 minutes on the unit in direct patient care. The direct patient care time included face-to-face time with the patient, reviewing the patient's chart, communicating with other professionals, and coordinating care.  Greater than 50% of this time was spent in counseling or coordinating care with the patient regarding goals of hospitalization, psycho-education, and discharge planning needs.  I have independently evaluated the patient during a face-to-face assessment on 05/05/21. I reviewed the patient's chart, and I participated in key portions of the service. I discussed the case with the [employer], and I agree with the assessment and plan of care as documented in the House Officer's note, as addended by me or notated below:  I directly edited the note, as above.   Phineas Inches, MD Psychiatrist

## 2021-05-05 NOTE — Progress Notes (Signed)
DAR NOTE: Patient presents with a flat affect and depressed mood.  Denies suicidal thoughts, pain, auditory and visual hallucinations.  Described energy level as low with good concentration.  Rates depression at 4, hopelessness at 2, and anxiety at 6.  Maintained on routine safety checks.  Medications given as prescribed.  Support and encouragement offered as needed.  Attended group and participated.  States goal for today is "write down 10 coping skills."  Patient visible in milieu with minimal interaction.  Zofran 4 mg given for complain of nausea with good effect.  Patient is safe on and off the unit.

## 2021-05-05 NOTE — [facility] Group Notes (Signed)
PsychoEducational group-  Pt were asked to read a poem by Lemmie Evens identifying the brain as both a chimp who over reacts and the owl, who thinks things through using judgement. Patients were then asked to apply this thinking to their own lives and what circumstances contributed to their being here. Pt identified her decision to try to commit suicide as her chimp decision and her owl decision as coming to get help.

## 2021-05-05 NOTE — B[facility] Group Notes (Signed)
Group Note- Orientation and goals group with PsychoEducation.  Pt were asked to identify goal they would like to work on, and unit ward rules were reviewed as well as a schedule of the day.  ''I walk down the street by [employer]'' poem was read to identify negative behavioral patterns.  Pt identified her negative intrusive thoughts as her hole and states she needs to do more work on positive affirmations.

## 2021-05-05 NOTE — B[facility] Group Notes (Signed)
Adult Psychoeducational Group Note  Date:  05/05/2021 Time:  2:51 PM  Group Topic/Focus:  Dimensions of Wellness:   The focus of this group is to introduce the topic of wellness and discuss the role each dimension of wellness plays in total health.  Participation Level:  Active  Participation Quality:  Appropriate  Affect:  Appropriate  Cognitive:  Appropriate  Insight: Appropriate  Engagement in Group:  Engaged  Modes of Intervention:  Discussion  Additional Comments: Patient attended group and participated.   Kj Imbert W Jodine Muchmore 05/05/2021, 2:51 PM

## 2021-05-05 NOTE — Group Note (Signed)
LCSW Group Therapy Note   Group Date: 05/05/2021 Start Time: 1300 End Time: 1400  Type of Therapy and Topic:  Group Therapy:  Self-Esteem   Participation Level:  Active  Description of Group: This group addressed positive self-esteem. Patients were given a worksheet with a blank shield. Patients were asked what a shield is and when it is used. Patients were asked to list, draw, or write protective factors in the their lives on their shields. Patients discussed the words, ideas and drawings that they put on their shield. Patients were encouraged to have a daily reflection of positive characteristics/ protective factors.  Therapeutic Goals Patient will verbalize two of their positive qualities Patient will demonstrate insight but naming social supports in their lives Patient will verbalize their feelings when voicing positive self affirmations and when voicing positive affirmations of others Patients will discuss the potential positive impact on their wellness/recovery of focusing on positive traits of self and others.  Summary of Patient Progress: Pt shared her name during introductions and stated that she is looking forward to seeing what her apartment looks like because her supports redecorated it while she has been in the hospital. Pt accepted worksheet and participated appropriately in discussion.  Felizardo Hoffmann, LCSWA 05/05/2021  1:20 PM

## 2021-05-06 MED ORDER — TOPIRAMATE 25 MG PO TABS
25.0000 mg | ORAL_TABLET | Freq: Two times a day (BID) | ORAL | Status: DC
  Administered 2021-05-06 – 2021-05-08 (×4): 25 mg via ORAL
  Filled 2021-05-06 (×6): qty 1

## 2021-05-06 NOTE — Group Note (Signed)
[facility] LCSW Group Therapy Note  Date:  05/06/2021   Type of Therapy and Topic:  Group Therapy:  Focus for the New Year  Participation Level:  Active   Description of Group:  The focus of this group was to provide patients with an opportunity to think about and discuss what they can work on this coming year that will result in them being happier and healthier one year from today.  It was reviewed how "new year's resolutions" often fade in importance within a few days, so patients were encouraged to think in a broader, more impactful way about what they wish to focus on to change their lives.  Therapeutic Goals Patients discussed in general the benefit of having goals to work on Patients described their own personal goals/focus for the next year that will enable them to be happier and healthier Patients received encouragement from each other and CSW Patients provided support and ideas to each other  Summary of Patient Progress: During group, patient expressed that her focus for the upcoming year is going to be to increase self-esteem.  Additionally, pt believes that when she is able to look at herself in the mirror and like what she sees, then she'll know her goal is reached.    Therapeutic Modalities Processing   Ambrose Mantle, LCSW (led group)  Creola Corn, LCSWA (wrote notes)

## 2021-05-06 NOTE — Progress Notes (Signed)
Patient ID: Sabrina Bennett, female   DOB: [birth year], 20 y.o.   MRN: [id number] D: Patient is visible in the milieu; she is attending groups and appears engaged with her peers. Patient reports improvement with her depressive symptoms and sleeplessness. Patient is compliant with her medications and had no questions regarding same. She was educated on medication administration and the importance of staying on her medications. She denies any thoughts of self harm or AVH. She is in no physical distress and denies any physical complaints.   A: Continue to monitor medication management and MD orders.  Safety checks completed every 15 minutes per protocol.  Offer support and encouragement as needed.  R: Patient is receptive to staff; her behavior is appropriate.

## 2021-05-06 NOTE — Progress Notes (Signed)
Adult Psychoeducational Group Note  Date:  05/06/2021 Time:  9:16 PM  Group Topic/Focus:  Wrap-Up Group:   The focus of this group is to help patients review their daily goal of treatment and discuss progress on daily workbooks.  Participation Level:  Active  Participation Quality:  Appropriate  Affect:  Appropriate  Cognitive:  Appropriate  Insight: Appropriate  Engagement in Group:  Engaged  Modes of Intervention:  Discussion  Additional Comments:    Charna Busman LongAdditional Comments:  patient  said her day was 8. Her goal for think about positive about herself. She achieved her goal coping skills reading stop negative thoughtsAdult Psychoeducational Group N 05/06/2021, 9:16 PM

## 2021-05-06 NOTE — Progress Notes (Signed)
°   05/06/21 0900  Psych Admission Type (Psych Patients Only)  Admission Status Voluntary  Psychosocial Assessment  Patient Complaints None  Eye Contact Fair  Facial Expression Anxious;Animated  Affect Appropriate to circumstance  Chartered loss adjuster Assertive  Motor Activity Slow  Appearance/Hygiene Unremarkable  Behavior Characteristics Cooperative  Mood Pleasant  Thought Process  Coherency WDL  Content WDL  Delusions None reported or observed  Perception WDL  Hallucination None reported or observed  Judgment Impaired  Confusion None  Danger to Self  Current suicidal ideation? Denies  Self-Injurious Behavior No self-injurious ideation or behavior indicators observed or expressed   Agreement Not to Harm Self Yes  Description of Agreement verbal  Danger to Others  Danger to Others None reported or observed

## 2021-05-06 NOTE — Progress Notes (Signed)
°   05/06/21 2119  Psych Admission Type (Psych Patients Only)  Admission Status Voluntary  Psychosocial Assessment  Patient Complaints None  Eye Contact Fair  Facial Expression Other (Comment) (Appropriate)  Affect Appropriate to circumstance  Speech Logical/coherent;Soft  Interaction Assertive  Motor Activity Slow  Appearance/Hygiene Unremarkable  Behavior Characteristics Cooperative;Appropriate to situation  Mood Pleasant  Thought Process  Coherency WDL  Content WDL  Delusions None reported or observed  Perception WDL  Hallucination None reported or observed  Judgment Impaired  Confusion None  Danger to Self  Current suicidal ideation? Denies  Self-Injurious Behavior No self-injurious ideation or behavior indicators observed or expressed   Agreement Not to Harm Self Yes  Description of Agreement Verbal contract  Danger to Others  Danger to Others None reported or observed

## 2021-05-06 NOTE — Progress Notes (Signed)
[facility] MD Progress Note  05/06/2021 11:55 AM Sabrina Bennett  MRN:  [id number] Subjective:   Sabrina Bennett is a 20 yr old female who presented for worsening depression and SI with plan to shoot herself.  PPHx is significant for Bipolar Disorder, Depression, GAD, OCD, ADHD- inattentive type, ARFID, and binge-eating Disorder.   Case was discussed in the multidisciplinary team. MAR was reviewed and patient was compliant with medications.  She required Atarax and Trazodone PRN's last night and one dose of Zofran around noon.   Psychiatric Team made the following recommendations yesterday: -Continue Zyprexa 5 mg QHS -Continue Prozac 10 mg with plans to increase to 20 mg 12/31 -Continue Lithium 450 mg BID -Continue Metformin 500 mg daily -Start PRN Zofran    On interview today patient reports her anxiety and depression are still improving.  She reports that her sleep is doing good as she is still able to sleep entirely through the night.  She reports that her appetite is continuing to improve as she was able to eat all of her lunch yesterday and most of her dinner and breakfast.  She reports no return of the nausea after the initial bout yesterday morning.  She reports no SI, HI, or AVH.  She reports that she has been attending groups and developing/refining her coping skills.  She states that yesterday she was able to finally open up and begin talking with her roommate.  She states that this is a new development for her is usually she is isolative.  Discussed with her that we had considered starting Topamax but due to the number of other medicine changes had waited till now.  Discussed that it would be beneficial to her for multiple reasons: Her binge eating, her high anxiety, and to help prevent potential metabolic side effects from her Zyprexa.  She reported understanding of this and was agreeable to starting the medication.  She reports no other concerns at present.   Principal Problem:  Bipolar 1 disorder, depressed, severe (HCC) Diagnosis: Principal Problem:   Bipolar 1 disorder, depressed, severe (HCC) Active Problems:   Generalized anxiety disorder   POTS (postural orthostatic tachycardia syndrome)   Subclinical hypothyroidism   PTSD (post-traumatic stress disorder)   OCD (obsessive compulsive disorder)   Panic disorder  Total Time spent with patient: 30 minutes  Past Psychiatric History: Bipolar Disorder, Depression, GAD, OCD, ADHD- inattentive type, ARFID, and binge-eating Disorder.  Past Medical History: History reviewed. No pertinent past medical history. History reviewed. No pertinent surgical history. Family History: History reviewed. No pertinent family history. Family Psychiatric  History: Mother - ADHD Father - EtOH use disorder Older Brother - Depression and Anxiety Younger Brother - ADHD Social History:  Social History   Substance and Sexual Activity  Alcohol Use Yes     Social History   Substance and Sexual Activity  Drug Use Never    Social History   Socioeconomic History   Marital status: Single    Spouse name: Not on file   Number of children: Not on file   Years of education: Not on file   Highest education level: Not on file  Occupational History   Not on file  Tobacco Use   Smoking status: Never    Passive exposure: Never   Smokeless tobacco: Never  Vaping Use   Vaping Use: Never used  Substance and Sexual Activity   Alcohol use: Yes   Drug use: Never   Sexual activity: Yes    Birth control/protection: Pill, Condom  Other Topics Concern   Not on file  Social History Narrative   Not on file   Social Determinants of Health   Financial Resource Strain: Not on file  Food Insecurity: Not on file  Transportation Needs: Not on file  Physical Activity: Not on file  Stress: Not on file  Social Connections: Not on file   Additional Social History:    Pain Medications: See MAR Prescriptions: See MAR Over the Counter:  See MAR                    Sleep: Good  Appetite:  Good  Current Medications: Current Facility-Administered Medications  Medication Dose Route Frequency Provider Last Rate Last Admin   acetaminophen (TYLENOL) tablet 650 mg  650 mg Oral Q6H PRN Jaclyn Shaggy, PA-C   650 mg at 05/03/21 1124   alum & mag hydroxide-simeth (MAALOX/MYLANTA) 200-200-20 MG/5ML suspension 30 mL  30 mL Oral Q4H PRN Melbourne Abts W, PA-C       feeding supplement (ENSURE ENLIVE / ENSURE PLUS) liquid 237 mL  237 mL Oral BID BM Melbourne Abts W, PA-C   237 mL at 05/06/21 0936   FLUoxetine (PROZAC) capsule 20 mg  20 mg Oral Daily Massengill, Harrold Donath, MD   20 mg at 05/06/21 0754   hydrOXYzine (ATARAX) tablet 25 mg  25 mg Oral TID PRN Jaclyn Shaggy, PA-C   25 mg at 05/05/21 2140   Levonorgestrel-Ethinyl Estradiol (AMETHIA) 0.15-0.03 &0.01 MG tablet 1 tablet  1 tablet Oral QHS Jaclyn Shaggy, PA-C   1 tablet at 05/05/21 2142   levothyroxine (SYNTHROID) tablet 50 mcg  50 mcg Oral Daily Melbourne Abts W, PA-C   50 mcg at 05/06/21 1610   lithium carbonate (ESKALITH) CR tablet 450 mg  450 mg Oral BID Phineas Inches, MD   450 mg at 05/06/21 0755   magnesium hydroxide (MILK OF MAGNESIA) suspension 30 mL  30 mL Oral Daily PRN Melbourne Abts W, PA-C       metFORMIN (GLUCOPHAGE) tablet 500 mg  500 mg Oral Q breakfast Lamar Sprinkles, MD   500 mg at 05/06/21 0755   midodrine (PROAMATINE) tablet 2.5 mg  2.5 mg Oral BID Melbourne Abts W, PA-C   2.5 mg at 05/06/21 0755   OLANZapine (ZYPREXA) tablet 5 mg  5 mg Oral QHS Massengill, Harrold Donath, MD   5 mg at 05/05/21 2139   ondansetron (ZOFRAN-ODT) disintegrating tablet 4 mg  4 mg Oral Q8H PRN Massengill, Harrold Donath, MD   4 mg at 05/05/21 1121   topiramate (TOPAMAX) tablet 25 mg  25 mg Oral BID Lauro Franklin, MD       traZODone (DESYREL) tablet 50 mg  50 mg Oral QHS PRN Melbourne Abts W, PA-C   50 mg at 05/05/21 2141    Lab Results: No results found for this or any previous visit (from  the past 48 hour(s)).  Blood Alcohol level:  No results found for: [facility]  Metabolic Disorder Labs: Lab Results  Component Value Date   HGBA1C 5.0 05/03/2021   MPG 96.8 05/03/2021   No results found for: PROLACTIN Lab Results  Component Value Date   CHOL 140 05/03/2021   TRIG 47 05/03/2021   HDL 47 05/03/2021   CHOLHDL 3.0 05/03/2021   VLDL 9 05/03/2021   LDLCALC 84 05/03/2021    Physical Findings: AIMS:  , ,  ,  ,    CIWA:    COWS:     Musculoskeletal:  Strength & Muscle Tone: within normal limits Gait & Station: normal Patient leans: N/A  Psychiatric Specialty Exam:  Presentation  General Appearance: Appropriate for Environment; Casual; Fairly Groomed  Eye Contact:Good (did not have staring as she did yesterday)  Speech:Clear and Coherent; Normal Rate  Speech Volume:Normal  Handedness:No data recorded  Mood and Affect  Mood:Anxious; Dysphoric (Continuing to improve)  Affect:Congruent; Depressed   Thought Process  Thought Processes:Coherent; Goal Directed  Descriptions of Associations:Intact  Orientation:Full (Time, Place and Person)  Thought Content:Logical  History of Schizophrenia/Schizoaffective disorder:No data recorded Duration of Psychotic Symptoms:No data recorded Hallucinations:Hallucinations: None  Ideas of Reference:None  Suicidal Thoughts:Suicidal Thoughts: No  Homicidal Thoughts:Homicidal Thoughts: No   Sensorium  Memory:Immediate Good; Recent Good  Judgment:Fair  Insight:Good   Executive Functions  Concentration:Good  Attention Span:Good  Recall:Good  Fund of Knowledge:Good  Language:Good   Psychomotor Activity  Psychomotor Activity:Psychomotor Activity: Normal   Assets  Assets:Communication Skills; Desire for Improvement; Financial Resources/Insurance; Housing; Physical Health; Resilience; Social Support; Leisure Time; Transportation; Vocational/Educational; Intimacy   Sleep  Sleep:Sleep:  Good    Physical Exam: Physical Exam Vitals and nursing note reviewed.  Constitutional:      General: She is not in acute distress.    Appearance: Normal appearance. She is obese. She is not ill-appearing or toxic-appearing.  HENT:     Head: Normocephalic and atraumatic.  Pulmonary:     Effort: Pulmonary effort is normal.  Musculoskeletal:        General: Normal range of motion.  Neurological:     General: No focal deficit present.     Mental Status: She is alert.   Review of Systems  Respiratory:  Negative for cough and shortness of breath.   Cardiovascular:  Negative for chest pain.  Gastrointestinal:  Negative for abdominal pain, constipation, diarrhea, nausea and vomiting.  Neurological:  Negative for weakness and headaches.  Psychiatric/Behavioral:  Positive for depression (improving). Negative for hallucinations and suicidal ideas. The patient is nervous/anxious (improving).    Blood pressure 90/66, pulse (!) 132, temperature (!) 97.4 F (36.3 C), temperature source Oral, resp. rate 16, height 5\' 8"  (1.727 m), weight 89.6 kg, last menstrual period 03/02/2021, SpO2 100 %. Body mass index is 30.04 kg/m.   Treatment Plan Summary: Daily contact with patient to assess and evaluate symptoms and progress in treatment and Medication management  Sabrina Bennett is a 20 yr old female who presented for worsening depression and SI with plan to shoot herself.  PPHx is significant for Bipolar Disorder, Depression, GAD, OCD, ADHD- inattentive type, ARFID, and binge-eating Disorder.   We will recheck a lithium level, BMP, and TSH on Monday 1/1.  Sabrina Bennett is continuing to improve as her depression and anxiety are lessening.  She tolerated the increase in her Prozac this morning so far.  We will also start low-dose Topamax.  We will continue to monitor.   Bipolar 1 Disorder   GAD   PTSD   Panic Disorder   OCD: -Continue Zyprexa 5 mg QHS -Increase Prozac to 20 mg today -Continue  Lithium 450 mg BID  -Start Topamax 25 mg BID for Anxiety, Binge Eating, and metabolic side effects of Zyprexa -Re-check Lithium Level and BMP on 1/1 -Continue Metformin 500 mg daily with breakfast  -Vraylar finished 12/29 -OCD dx determined with YBOCS: Obsessions 16, compulsions 14, total 30= severe.   Subclinical hypothyroidism: -Continue home Levothyroxine 50 mcg daily -TSH (12/28): 6.815 -Redraw TSH on 1/1 -Follow up with PCP on discharge  Nausea: -Continue Zofran ODT 4 mg q8 PRN   POTS: -Continue home Midodrine 2.5 mg BID   -Continue PRN's: Tylenol, Maalox, Atarax, Milk of Magnesia, Trazodone   Lauro Franklin, MD 05/06/2021, 11:55 AM

## 2021-05-06 NOTE — [facility] Group Notes (Signed)
[facility] Group Notes:  (Nursing/MHT/Case Management/Adjunct)  Date:  05/06/2021  Time:  1:37 PM  Type of Therapy:  Group Therapy  Participation Level:  Active  Participation Quality:  Appropriate  Affect:  Appropriate  Cognitive:  Appropriate  Insight:  Appropriate  Engagement in Group:  Lacking  Modes of Intervention:  Discussion  Summary of Progress/Problems: Patient was engaged in group in a limited manner   [name] 05/06/2021, 1:37 PM

## 2021-05-06 NOTE — [facility] Group Notes (Signed)
[facility] Group Notes:  (Nursing/MHT/Case Management/Adjunct)  Date:  05/06/2021  Time:  11:00 AM  Type of Therapy:  Group Therapy  Participation Level:  Minimal  Participation Quality:  Appropriate  Affect:  Appropriate  Cognitive:  Alert  Insight:  Appropriate  Engagement in Group:  Limited  Modes of Intervention:  Discussion  Summary of Progress/Problems: Patient was engaged in group with leader prompting her to engaged.  [name] 05/06/2021, 11:00 AM

## 2021-05-06 NOTE — Progress Notes (Signed)
°   05/05/21 2052  Psych Admission Type (Psych Patients Only)  Admission Status Voluntary  Psychosocial Assessment  Patient Complaints None  Eye Contact Fair  Facial Expression Anxious;Animated  Affect Appropriate to circumstance  Speech Logical/coherent;Soft  Interaction Assertive  Motor Activity Slow  Appearance/Hygiene Unremarkable  Behavior Characteristics Cooperative;Calm  Mood Pleasant  Thought Process  Coherency WDL  Content WDL  Delusions None reported or observed  Perception WDL  Hallucination None reported or observed  Judgment Impaired  Confusion None  Danger to Self  Current suicidal ideation? Denies  Self-Injurious Behavior No self-injurious ideation or behavior indicators observed or expressed   Agreement Not to Harm Self Yes  Description of Agreement Verbal contract  Danger to Others  Danger to Others None reported or observed

## 2021-05-07 DIAGNOSIS — F314 Bipolar disorder, current episode depressed, severe, without psychotic features: Principal | ICD-10-CM

## 2021-05-07 LAB — LITHIUM LEVEL: Lithium Lvl: 0.46 mmol/L — ABNORMAL LOW (ref 0.60–1.20)

## 2021-05-07 LAB — BASIC METABOLIC PANEL WITH GFR
Anion gap: 6 (ref 5–15)
BUN: 13 mg/dL (ref 6–20)
CO2: 25 mmol/L (ref 22–32)
Calcium: 9.2 mg/dL (ref 8.9–10.3)
Chloride: 107 mmol/L (ref 98–111)
Creatinine, Ser: 1.07 mg/dL — ABNORMAL HIGH (ref 0.44–1.00)
GFR, Estimated: >60 mL/min (ref >60)
Glucose, Bld: 100 mg/dL — ABNORMAL HIGH (ref 70–99)
Potassium: 3.9 mmol/L (ref 3.5–5.1)
Sodium: 138 mmol/L (ref 135–145)

## 2021-05-07 LAB — TSH: TSH: 7.483 u[IU]/mL — ABNORMAL HIGH (ref 0.350–4.500)

## 2021-05-07 MED ORDER — LEVOTHYROXINE SODIUM 75 MCG PO TABS
75.0000 ug | ORAL_TABLET | Freq: Every day | ORAL | Status: DC
  Administered 2021-05-08: 75 ug via ORAL
  Filled 2021-05-07 (×2): qty 1

## 2021-05-07 NOTE — Progress Notes (Incomplete)
[facility] MD Progress Note  05/07/2021 6:47 AM Sabrina Bennett  MRN:  [id number] Subjective:   Sabrina Bennett is a 21 yr old female who presented for worsening depression and SI with plan to shoot herself.  PPHx is significant for Bipolar Disorder, Depression, GAD, OCD, ADHD- inattentive type, ARFID, and binge-eating Disorder.   Case was discussed in the multidisciplinary team. MAR was reviewed and patient was compliant with medications.  She required Trazodone and Atarax PRN's last night.   Psychiatric Team made the following recommendations yesterday: -Continue Zyprexa 5 mg QHS -Increase Prozac to 20 mg  -Start Topamax 25 mg BID for Anxiety, Binge Eating, and metabolic side effects of Zyprexa -Continue Lithium 450 mg BID -Continue Metformin 500 mg daily -Continue PRN Zofran     On interview today patient reports ***   Principal Problem: Bipolar 1 disorder, depressed, severe (HCC) Diagnosis: Principal Problem:   Bipolar 1 disorder, depressed, severe (HCC) Active Problems:   Generalized anxiety disorder   POTS (postural orthostatic tachycardia syndrome)   Subclinical hypothyroidism   PTSD (post-traumatic stress disorder)   OCD (obsessive compulsive disorder)   Panic disorder  Total Time spent with patient: 30 minutes  Past Psychiatric History: Bipolar Disorder, Depression, GAD, OCD, ADHD- inattentive type, ARFID, and binge-eating Disorder.  Past Medical History: History reviewed. No pertinent past medical history. History reviewed. No pertinent surgical history. Family History: History reviewed. No pertinent family history. Family Psychiatric  History: Mother - ADHD Father - EtOH use disorder Older Brother - Depression and Anxiety Younger Brother - ADHD Social History:  Social History   Substance and Sexual Activity  Alcohol Use Yes     Social History   Substance and Sexual Activity  Drug Use Never    Social History   Socioeconomic History   Marital status:  Single    Spouse name: Not on file   Number of children: Not on file   Years of education: Not on file   Highest education level: Not on file  Occupational History   Not on file  Tobacco Use   Smoking status: Never    Passive exposure: Never   Smokeless tobacco: Never  Vaping Use   Vaping Use: Never used  Substance and Sexual Activity   Alcohol use: Yes   Drug use: Never   Sexual activity: Yes    Birth control/protection: Pill, Condom  Other Topics Concern   Not on file  Social History Narrative   Not on file   Social Determinants of Health   Financial Resource Strain: Not on file  Food Insecurity: Not on file  Transportation Needs: Not on file  Physical Activity: Not on file  Stress: Not on file  Social Connections: Not on file   Additional Social History:    Pain Medications: See [facility] Prescriptions: See MAR Over the Counter: See MAR                    Sleep: Good  Appetite:  Good  Current Medications: Current Facility-Administered Medications  Medication Dose Route Frequency Provider Last Rate Last Admin   acetaminophen (TYLENOL) tablet 650 mg  650 mg Oral Q6H PRN Jaclyn Shaggy, PA-C   650 mg at 05/03/21 1124   alum & mag hydroxide-simeth (MAALOX/MYLANTA) 200-200-20 MG/5ML suspension 30 mL  30 mL Oral Q4H PRN Jaclyn Shaggy, PA-C       feeding supplement (ENSURE ENLIVE / ENSURE PLUS) liquid 237 mL  237 mL Oral BID BM Jaclyn Shaggy, PA-C  237 mL at 05/06/21 1348   FLUoxetine (PROZAC) capsule 20 mg  20 mg Oral Daily Massengill, Harrold Donath, MD   20 mg at 05/06/21 0754   hydrOXYzine (ATARAX) tablet 25 mg  25 mg Oral TID PRN Jaclyn Shaggy, PA-C   25 mg at 05/06/21 2119   Levonorgestrel-Ethinyl Estradiol (AMETHIA) 0.15-0.03 &0.01 MG tablet 1 tablet  1 tablet Oral QHS Jaclyn Shaggy, PA-C   1 tablet at 05/06/21 2119   levothyroxine (SYNTHROID) tablet 50 mcg  50 mcg Oral Daily Jaclyn Shaggy, PA-C   50 mcg at 05/06/21 1610   lithium carbonate (ESKALITH) CR tablet  450 mg  450 mg Oral BID Phineas Inches, MD   450 mg at 05/06/21 1654   magnesium hydroxide (MILK OF MAGNESIA) suspension 30 mL  30 mL Oral Daily PRN Melbourne Abts W, PA-C       metFORMIN (GLUCOPHAGE) tablet 500 mg  500 mg Oral Q breakfast Lamar Sprinkles, MD   500 mg at 05/06/21 0755   midodrine (PROAMATINE) tablet 2.5 mg  2.5 mg Oral BID Melbourne Abts W, PA-C   2.5 mg at 05/06/21 1654   OLANZapine (ZYPREXA) tablet 5 mg  5 mg Oral QHS Massengill, Harrold Donath, MD   5 mg at 05/06/21 2119   ondansetron (ZOFRAN-ODT) disintegrating tablet 4 mg  4 mg Oral Q8H PRN Massengill, Harrold Donath, MD   4 mg at 05/05/21 1121   topiramate (TOPAMAX) tablet 25 mg  25 mg Oral BID Lauro Franklin, MD   25 mg at 05/06/21 1654   traZODone (DESYREL) tablet 50 mg  50 mg Oral QHS PRN Jaclyn Shaggy, PA-C   50 mg at 05/06/21 2119    Lab Results: No results found for this or any previous visit (from the past 48 hour(s)).  Blood Alcohol level:  No results found for: [facility]  Metabolic Disorder Labs: Lab Results  Component Value Date   HGBA1C 5.0 05/03/2021   MPG 96.8 05/03/2021   No results found for: PROLACTIN Lab Results  Component Value Date   CHOL 140 05/03/2021   TRIG 47 05/03/2021   HDL 47 05/03/2021   CHOLHDL 3.0 05/03/2021   VLDL 9 05/03/2021   LDLCALC 84 05/03/2021    Physical Findings: AIMS:  , ,  ,  ,    CIWA:    COWS:     Musculoskeletal: Strength & Muscle Tone: within normal limits Gait & Station: normal Patient leans: N/A  Psychiatric Specialty Exam:  Presentation  General Appearance: Appropriate for Environment; Casual; Fairly Groomed  Eye Contact:Good (did not have staring as she did yesterday)  Speech:Clear and Coherent; Normal Rate  Speech Volume:Normal  Handedness:No data recorded  Mood and Affect  Mood:Anxious; Dysphoric (Continuing to improve)  Affect:Congruent; Depressed   Thought Process  Thought Processes:Coherent; Goal Directed  Descriptions of  Associations:Intact  Orientation:Full (Time, Place and Person)  Thought Content:Logical  History of Schizophrenia/Schizoaffective disorder:No data recorded Duration of Psychotic Symptoms:No data recorded Hallucinations:Hallucinations: None  Ideas of Reference:None  Suicidal Thoughts:Suicidal Thoughts: No  Homicidal Thoughts:Homicidal Thoughts: No   Sensorium  Memory:Immediate Good; Recent Good  Judgment:Fair  Insight:Good   Executive Functions  Concentration:Good  Attention Span:Good  Recall:Good  Fund of Knowledge:Good  Language:Good   Psychomotor Activity  Psychomotor Activity:Psychomotor Activity: Normal   Assets  Assets:Communication Skills; Desire for Improvement; Financial Resources/Insurance; Housing; Physical Health; Resilience; Social Support; Leisure Time; Transportation; Vocational/Educational; Intimacy   Sleep  Sleep:Sleep: Good    Physical Exam: Physical Exam ROS  Blood pressure 113/74, pulse 97, temperature (!) 97.4 F (36.3 C), temperature source Oral, resp. rate 16, height 5\' 8"  (1.727 m), weight 89.6 kg, last menstrual period 03/02/2021, SpO2 100 %. Body mass index is 30.04 kg/m.   Treatment Plan Summary: Daily contact with patient to assess and evaluate symptoms and progress in treatment and Medication management  Sabrina Bennett is a 21 yr old female who presented for worsening depression and SI with plan to shoot herself.  PPHx is significant for Bipolar Disorder, Depression, GAD, OCD, ADHD- inattentive type, ARFID, and binge-eating Disorder.   We will recheck a lithium level, BMP, and TSH on Monday 1/1.  Sabrina Bennett ***   Bipolar 1 Disorder   GAD   PTSD   Panic Disorder   OCD: -Continue Zyprexa 5 mg QHS -Continue Prozac to 20 mg -Continue Lithium 450 mg BID  -Start Topamax 25 mg BID for Anxiety, Binge Eating, and metabolic side effects of Zyprexa -Re-check Lithium Level and BMP on 1/1 -Continue Metformin 500 mg daily with  breakfast  -Vraylar finished 12/29 -OCD dx determined with YBOCS: Obsessions 16, compulsions 14, total 30= severe.   Subclinical hypothyroidism: -Continue home Levothyroxine 50 mcg daily -TSH (12/28): 6.815 -Redraw TSH on 1/1 -Follow up with PCP on discharge   Nausea: -Continue Zofran ODT 4 mg q8 PRN   POTS: -Continue Midodrine 2.5 mg BID   -Continue PRN's: Tylenol, Maalox, Atarax, Milk of Magnesia, Trazodone   Lauro FranklinAlexander S Steve Youngberg, MD 05/07/2021, 6:47 AM

## 2021-05-07 NOTE — Progress Notes (Signed)
Patient ID: Sabrina Bennett, female   DOB: 2001-01-04, 20 y.o.   MRN: [id number] D: Patient is pleasant; she is visible in the milieu and is attending groups with limited participation. She was concerned about her lab values today. She denies any thoughts of self harm; she denies any auditory/visual hallucinations. Patient does not appear to be in any physical distress; no nausea noted today. She is compliant with her medication. Patient is interacting well with staff and her peers.   A: Continue to monitor medication management and MD orders.  Safety checks completed every 15 minutes per protocol.  Offer support and encouragement as needed.  R: Patient is receptive to staff; her behavior is appropriate.

## 2021-05-07 NOTE — [facility] Group Notes (Addendum)
[facility] Group Notes:  (Nursing)  Date:  05/07/2021  Time:  130 PM  Type of Therapy:  Group Therapy  Participation Level:  Active  Participation Quality:  Appropriate and Attentive  Affect:  Appropriate  Cognitive:  Appropriate  Insight:  Appropriate  Engagement in Group:  Engaged  Modes of Intervention:  Activity, Discussion, Exploration, and Rapport Building  Summary of Progress/Problems: Patients were asked to choose a quality that would be helpful to their life at the moment, such as hope, joy, courage, acceptance, etc Patients then created a collage, pulling images from magazines, that represented the essence of that quality. Patients then shared with the group why they chose the images they did. Dayzee shared that dignity and acceptance were important to her. She talked about resilience, as one of her images she chose read, "survived many storms emerging more powerful from each wave of turmoil".   Shela Nevin 05/07/2021, 3:30 PM

## 2021-05-07 NOTE — Progress Notes (Signed)
[facility] MD Progress Note  05/07/2021 2:19 PM Sabrina Bennett Bennett  MRN:  [id number] Subjective:   Sabrina Bennett Bennett is a 21 yr old female who presented for worsening depression and SI with plan to shoot herself.  PPHx is significant for Bipolar Disorder, Depression, GAD, OCD, ADHD- inattentive type, ARFID, and binge-eating Disorder.   Case was discussed in the multidisciplinary team. MAR was reviewed and patient was compliant with medications.  She required Atarax and Trazodone PRN's last night and one dose of Zofran around noon.   Psychiatric Team made the following recommendations yesterday: -Continue Zyprexa 5 mg QHS -Continue Prozac 10 mg with plans to increase to 20 mg 12/31 -Continue Lithium 450 mg BID -Continue Metformin 500 mg daily   On interview today patient reports her anxiety and depression are still improving.  She reports that her sleep is regular. She is eating regularly. No binging or restricting behaviors since admit. We discuss topamax and the effects it can have on appetite. We also talk about the effect it can have on impulsivity and compulsive behaivor. No hallucinations, thoughts of harm to self or others.   She reports that she has been attending groups and developing/refining her coping skills.  She states that she is getting along with peers. She appears to be slowly gaining some confidence in her interactions.   On request we did meet again later to discuss her lab results. Explained that TSH was again high, but would not be expected to come down for 4-6 weeks after this increase of levothyroxine to . Discussed that lithium level is subtherapeutic, but the level is less important than her report of mood   Principal Problem: Bipolar 1 disorder, depressed, severe (HCC) Diagnosis: Principal Problem:   Bipolar 1 disorder, depressed, severe (HCC) Active Problems:   Generalized anxiety disorder   POTS (postural orthostatic tachycardia syndrome)   Subclinical  hypothyroidism   PTSD (post-traumatic stress disorder)   OCD (obsessive compulsive disorder)   Panic disorder  Total Time spent with patient: 30 minutes  Past Psychiatric History: Bipolar Disorder, Depression, GAD, OCD, ADHD- inattentive type, ARFID, and binge-eating Disorder.  Past Medical History: History reviewed. No pertinent past medical history. History reviewed. No pertinent surgical history. Family History: History reviewed. No pertinent family history. Family Psychiatric  History: Mother - ADHD Father - EtOH use disorder Older Brother - Depression and Anxiety Younger Brother - ADHD Social History:  Social History   Substance and Sexual Activity  Alcohol Use Yes     Social History   Substance and Sexual Activity  Drug Use Never    Social History   Socioeconomic History   Marital status: Single    Spouse name: Not on file   Number of children: Not on file   Years of education: Not on file   Highest education level: Not on file  Occupational History   Not on file  Tobacco Use   Smoking status: Never    Passive exposure: Never   Smokeless tobacco: Never  Vaping Use   Vaping Use: Never used  Substance and Sexual Activity   Alcohol use: Yes   Drug use: Never   Sexual activity: Yes    Birth control/protection: Pill, Condom  Other Topics Concern   Not on file  Social History Narrative   Not on file   Social Determinants of Health   Financial Resource Strain: Not on file  Food Insecurity: Not on file  Transportation Needs: Not on file  Physical Activity: Not on file  Stress: Not on file  Social Connections: Not on file   Additional Social History:    Pain Medications: See MAR Prescriptions: See MAR Over the Counter: See MAR       Sleep: Good  Appetite:  Good  Current Medications: Current Facility-Administered Medications  Medication Dose Route Frequency Provider Last Rate Last Admin   acetaminophen (TYLENOL) tablet 650 mg  650 mg Oral Q6H  PRN Jaclyn Shaggyaylor, Cody W, PA-C   650 mg at 05/03/21 1124   alum & mag hydroxide-simeth (MAALOX/MYLANTA) 200-200-20 MG/5ML suspension 30 mL  30 mL Oral Q4H PRN Jaclyn Shaggyaylor, Cody W, PA-C       feeding supplement (ENSURE ENLIVE / ENSURE PLUS) liquid 237 mL  237 mL Oral BID BM Ladona Ridgelaylor, Cody W, PA-C   237 mL at 05/07/21 1003   FLUoxetine (PROZAC) capsule 20 mg  20 mg Oral Daily Massengill, Harrold DonathNathan, MD   20 mg at 05/07/21 [id number]   hydrOXYzine (ATARAX) tablet 25 mg  25 mg Oral TID PRN Jaclyn Shaggyaylor, Cody W, PA-C   25 mg at 05/06/21 2119   Levonorgestrel-Ethinyl Estradiol (AMETHIA) 0.15-0.03 &0.01 MG tablet 1 tablet  1 tablet Oral QHS Jaclyn Shaggyaylor, Cody W, PA-C   1 tablet at 05/06/21 2119   levothyroxine (SYNTHROID) tablet 50 mcg  50 mcg Oral Daily Melbourne Abtsaylor, Cody W, PA-C   50 mcg at 05/07/21 11910803   lithium carbonate (ESKALITH) CR tablet 450 mg  450 mg Oral BID Massengill, Harrold DonathNathan, MD   450 mg at 05/07/21 0802   magnesium hydroxide (MILK OF MAGNESIA) suspension 30 mL  30 mL Oral Daily PRN Melbourne Abtsaylor, Cody W, PA-C       metFORMIN (GLUCOPHAGE) tablet 500 mg  500 mg Oral Q breakfast Cosby, Toni Amendourtney, MD   500 mg at 05/07/21 0803   midodrine (PROAMATINE) tablet 2.5 mg  2.5 mg Oral BID Melbourne Abtsaylor, Cody W, PA-C   2.5 mg at 05/07/21 0803   OLANZapine (ZYPREXA) tablet 5 mg  5 mg Oral QHS Massengill, Nathan, MD   5 mg at 05/06/21 2119   ondansetron (ZOFRAN-ODT) disintegrating tablet 4 mg  4 mg Oral Q8H PRN Massengill, Harrold DonathNathan, MD   4 mg at 05/05/21 1121   topiramate (TOPAMAX) tablet 25 mg  25 mg Oral BID Lauro FranklinPashayan, Alexander S, MD   25 mg at 05/07/21 0802   traZODone (DESYREL) tablet 50 mg  50 mg Oral QHS PRN Jaclyn Shaggyaylor, Cody W, PA-C   50 mg at 05/06/21 2119    Lab Results:  Results for orders placed or performed during the hospital encounter of 05/03/21 (from the past 48 hour(s))  Lithium level     Status: Abnormal   Collection Time: 05/07/21  6:55 AM  Result Value Ref Range   Lithium Lvl 0.46 (L) 0.60 - 1.20 mmol/L    Comment: Performed at [facility], [address] [place], [place] [id number]  Basic metabolic panel     Status: Abnormal   Collection Time: 05/07/21  6:55 AM  Result Value Ref Range   Sodium 138 135 - 145 mmol/L   Potassium 3.9 3.5 - 5.1 mmol/L   Chloride 107 98 - 111 mmol/L   CO2 25 22 - 32 mmol/L   Glucose, Bld 100 (H) 70 - 99 mg/dL    Comment: Glucose reference range applies only to samples taken after fasting for at least 8 hours.   BUN 13 6 - 20 mg/dL   Creatinine, Ser 5.621.07 (H) 0.44 - 1.00 mg/dL   Calcium 9.2  8.9 - 10.3 mg/dL   GFR, Estimated >95 >62 mL/min    Comment: (NOTE) Calculated using the CKD-EPI Creatinine Equation (2021)    Anion gap 6 5 - 15    Comment: Performed at [facility], [address], [place], [place] [address]  TSH     Status: Abnormal   Collection Time: 05/07/21  6:55 AM  Result Value Ref Range   TSH 7.483 (H) 0.350 - 4.500 uIU/mL    Comment: Performed by a 3rd Generation assay with a functional sensitivity of <=0.01 uIU/mL. Performed at [facility], [address], [place], [place] [address]     Blood Alcohol level:  No results found for: [facility]  Metabolic Disorder Labs: Lab Results  Component Value Date   HGBA1C 5.0 05/03/2021   MPG 96.8 05/03/2021   No results found for: PROLACTIN Lab Results  Component Value Date   CHOL 140 05/03/2021   TRIG 47 05/03/2021   HDL 47 05/03/2021   CHOLHDL 3.0 05/03/2021   VLDL 9 05/03/2021   LDLCALC 84 05/03/2021    Physical Findings: AIMS:  , ,  ,  ,    CIWA:    COWS:     Musculoskeletal: Strength & Muscle Tone: within normal limits Gait & Station: normal Patient leans: N/A  Psychiatric Specialty Exam:  Presentation  General Appearance: Appropriate for Environment  Eye Contact:Fair  Speech:Clear and Coherent  Speech Volume:Decreased  Handedness:No data recorded  Mood and Affect  Mood:Depressed  Affect:Depressed   Thought Process  Thought  Processes:Coherent  Descriptions of Associations:Intact  Orientation:Full (Time, Place and Person)  Thought Content:Logical  History of Schizophrenia/Schizoaffective disorder:No data recorded Duration of Psychotic Symptoms:No data recorded Hallucinations:Hallucinations: Visual  Ideas of Reference:None  Suicidal Thoughts:Suicidal Thoughts: No  Homicidal Thoughts:Homicidal Thoughts: No   Sensorium  Memory:Recent Good  Judgment:Fair  Insight:Fair   Executive Functions  Concentration:Fair  Attention Span:Fair  Recall:Fair  Fund of Knowledge:Fair  Language:Good   Psychomotor Activity  Psychomotor Activity:Psychomotor Activity: Normal   Assets  Assets:Communication Skills; Physical Health   Sleep  Sleep:Sleep: Fair    Physical Exam: Physical Exam Vitals and nursing note reviewed.  Constitutional:      General: She is not in acute distress.    Appearance: Normal appearance. She is obese. She is not ill-appearing or toxic-appearing.  HENT:     Head: Normocephalic and atraumatic.  Pulmonary:     Effort: Pulmonary effort is normal.  Musculoskeletal:        General: Normal range of motion.  Neurological:     General: No focal deficit present.     Mental Status: She is alert.   Review of Systems  Respiratory:  Negative for cough and shortness of breath.   Cardiovascular:  Negative for chest pain.  Gastrointestinal:  Negative for abdominal pain, constipation, diarrhea, nausea and vomiting.  Neurological:  Negative for weakness and headaches.  Psychiatric/Behavioral:  Positive for depression (improving). Negative for hallucinations and suicidal ideas. The patient is nervous/anxious (improving).    Blood pressure 109/73, pulse (!) 101, temperature 98.3 F (36.8 C), resp. rate 16, height 5\' 8"  (1.727 m), weight 89.6 kg, last menstrual period 03/02/2021, SpO2 100 %. Body mass index is 30.04 kg/m.   Treatment Plan Summary: Daily contact with patient to  assess and evaluate symptoms and progress in treatment and Medication management  Sabrina Bennett Bennett is a 21 yr old female who presented for worsening depression and SI with plan to shoot herself.  PPHx is  significant for Bipolar Disorder, Depression, GAD, OCD, ADHD- inattentive type, ARFID, and binge-eating Disorder.   We will recheck a lithium level, BMP, and TSH on Monday 1/1.  Sabrina Bennett Bennett is continuing to improve as her depression and anxiety are lessening.  She tolerated the increase in her Prozac this morning so far.  We will also start low-dose Topamax.  We will continue to monitor.   Bipolar 1 Disorder   GAD   PTSD   Panic Disorder   OCD: -Continue Zyprexa 5 mg QHS -Increase Prozac to 20 mg today -Continue Lithium 450 mg BID  -Topamax 25 mg BID for Anxiety, Binge Eating, and metabolic side effects of Zyprexa -Re-check Lithium Level 05/07/21= 0.46 -Continue Metformin 500 mg daily with breakfast  -Vraylar finished 12/29 -OCD dx determined with YBOCS: Obsessions 16, compulsions 14, total 30= severe.   Subclinical hypothyroidism: -Continue home Levothyroxine 50 mcg daily -TSH (12/28): 6.815 -Redraw TSH on 05/07/21 = 7.483  -increase levothyroxine 75mcg on 05/07/21 -Follow up with PCP on discharge   Nausea: -Continue Zofran ODT 4 mg q8 PRN   POTS: -Continue home Midodrine 2.5 mg BID   -Continue PRN's: Tylenol, Maalox, Atarax, Milk of Magnesia, Trazodone   Roselle LocusStephanie Leigh Muaaz Brau, MD 05/07/2021, 2:19 PM

## 2021-05-07 NOTE — Progress Notes (Signed)
°   05/07/21 1300  Psych Admission Type (Psych Patients Only)  Admission Status Voluntary  Psychosocial Assessment  Patient Complaints None  Eye Contact Fair  Facial Expression Other (Comment) (appropriate)  Affect Appropriate to circumstance  Speech Logical/coherent  Interaction Forwards little  Motor Activity Slow  Appearance/Hygiene Unremarkable  Behavior Characteristics Cooperative  Mood Pleasant  Thought Process  Coherency WDL  Content WDL  Delusions None reported or observed  Perception WDL  Hallucination None reported or observed  Judgment Impaired  Confusion None  Danger to Self  Current suicidal ideation? Denies  Self-Injurious Behavior No self-injurious ideation or behavior indicators observed or expressed   Agreement Not to Harm Self Yes  Description of Agreement verbal  Danger to Others  Danger to Others None reported or observed

## 2021-05-07 NOTE — Progress Notes (Signed)
Patient has been up in the dayroom interacting with peers. Writer spoke with her and she reports that she feels better since her admission. She reports that coming here was good for her.She feels that her medications are helping. She denies si/hi, auditory and visual hallucinations.

## 2021-05-07 NOTE — [facility] Group Notes (Signed)
Adult Psychoeducational Group Note  Date:  05/07/2021 Time:  6:40 PM  Group Topic/Focus:  Relaxation  Participation Level:  Active  Participation Quality:  Appropriate  Affect:  Appropriate  Cognitive:  Appropriate  Insight: Appropriate  Engagement in Group:  Engaged  Modes of Intervention:  Activity  Donell Beers 05/07/2021, 6:40 PM

## 2021-05-07 NOTE — [facility] Group Notes (Signed)
[facility] Group Notes:  (Nursing/MHT/Case Management/Adjunct)  Date:  05/07/2021  Time:  10:23 PM  Type of Therapy:  Group Therapy  Participation Level:  Active  Participation Quality:  Appropriate  Affect:  Appropriate  Cognitive:  Appropriate  Insight:  Appropriate  Engagement in Group:  Engaged  Modes of Intervention:  Discussion  Summary of Progress/Problems:Explained to patient ways to get his needs meet. Encouraged client to talk with his doctors about his medication concerns.  [name] 05/07/2021, 10:23 PM

## 2021-05-07 NOTE — Group Note (Signed)
[facility] LCSW Group Therapy Note  Date/Time:  05/07/2021 10:15am-11:15am  Type of Therapy and Topic:  Group Therapy:  Healthy and Unhealthy Supports  Participation Level:  Minimal   Description of Group:  Patients in this group were introduced to the idea of adding a variety of healthy supports to address the various needs in their lives, especially in reference to their plans and focus for the new year.  Patients discussed what additional healthy supports could be helpful in their recovery and wellness after discharge in order to prevent future hospitalizations.   An emphasis was placed on using counselor, doctor, therapy groups, 12-step groups, and problem-specific support groups to expand supports.    Therapeutic Goals:   1)  discuss importance of adding supports to stay well once out of the hospital  2)  compare healthy versus unhealthy supports and identify some examples of each  3)  generate ideas and descriptions of healthy supports that can be added  4)  offer mutual support about how to address unhealthy supports  5)  encourage active participation in and adherence to discharge plan    Summary of Patient Progress:  The patient stated that current healthy supports in her life is mother.  The patient expressed a willingness to add therapy and school advisor as support(s) to help in her recovery journey.   Therapeutic Modalities:   Motivational Interviewing Brief Solution-Focused Therapy  Creola Corn, LCSW-A

## 2021-05-07 NOTE — Group Note (Signed)
Date:  05/07/2021 Time:  7:08 PM  Group Topic/Focus:  Healthy Communication:   The focus of this group is to discuss communication, barriers to communication, as well as healthy ways to communicate with others.    Participation Level:  Active  Modes of Intervention:  Activity  Additional Comments:  Pt received group materials with information on cultivating a healthy support system.  Pt was encouraged to reach out to the MHT,RN or LCSW during reflection time if they needed assistance or had questions.    Sabrina Bennett 05/07/2021, 7:08 PM

## 2021-05-08 ENCOUNTER — Encounter (HOSPITAL_COMMUNITY): Payer: Self-pay

## 2021-05-08 MED ORDER — OLANZAPINE 5 MG PO TABS: 5.0000 mg | ORAL_TABLET | Freq: Every day | ORAL | 0 refills | Status: DC

## 2021-05-08 MED ORDER — LITHIUM CARBONATE ER 450 MG PO TBCR: 450.0000 mg | EXTENDED_RELEASE_TABLET | Freq: Two times a day (BID) | ORAL | 0 refills | Status: DC

## 2021-05-08 MED ORDER — METFORMIN HCL 500 MG PO TABS: 500.0000 mg | ORAL_TABLET | Freq: Every day | ORAL | 0 refills | Status: DC

## 2021-05-08 MED ORDER — TOPIRAMATE 25 MG PO TABS: 25.0000 mg | ORAL_TABLET | Freq: Two times a day (BID) | ORAL | 0 refills | Status: DC

## 2021-05-08 MED ORDER — LEVOTHYROXINE SODIUM 75 MCG PO TABS: 75.0000 ug | ORAL_TABLET | Freq: Every day | ORAL | 0 refills | Status: AC

## 2021-05-08 MED ORDER — HYDROXYZINE HCL 25 MG PO TABS: 25.0000 mg | ORAL_TABLET | Freq: Three times a day (TID) | ORAL | 0 refills | Status: DC | PRN

## 2021-05-08 MED ORDER — FLUOXETINE HCL 20 MG PO CAPS: 20.0000 mg | ORAL_CAPSULE | Freq: Every day | ORAL | 0 refills | Status: DC

## 2021-05-08 MED ORDER — TRAZODONE HCL 50 MG PO TABS: 50.0000 mg | ORAL_TABLET | Freq: Every evening | ORAL | 0 refills | Status: DC | PRN

## 2021-05-08 NOTE — Group Note (Signed)
Recreation Therapy Group Note   Group Topic:Stress Management  Group Date: 05/08/2021 Start Time: 0930 End Time: 0945 Facilitators: Caroll Rancher, [place] Location: [address] Dayroom   Goal Area(s) Addresses:  Patient will identify positive stress management techniques. Patient will identify benefits of using stress management post d/c.  Group Description:  Meditation.  LRT played a meditation that focused on letting go of the past and living in the moment.  Patients were to listen as meditation played to fully engage in the activity.   Affect/Mood: Appropriate   Participation Level: Engaged   Participation Quality: Independent   Behavior: Appropriate   Speech/Thought Process: Focused   Insight: Good   Judgement: Good   Modes of Intervention: Meditation   Patient Response to Interventions:  Engaged   Education Outcome:  Acknowledges education and In group clarification offered    Clinical Observations/Individualized Feedback: Pt attended and participated in group.    Plan: Continue to engage patient in RT group sessions 2-3x/week.   Caroll Rancher, LRT,CTRS 05/08/2021 11:19 AM

## 2021-05-08 NOTE — Progress Notes (Signed)
Discharge Note:  Patient discharged to her apartment with her boyfriend.  Suicide prevention information given and discussed with patient who stated she understood and had no questions.  Patient denied SI and HI.  Denied A/V hallucinations.  Patient stated she received all her belongings, clothing, toiletries, misc items, etc.  Patient stated she appreciated all assistance received from [facility] staff.  All required discharge information given.

## 2021-05-08 NOTE — Progress Notes (Signed)
D:  Patient's self inventory sheet, patient sleeps good, sleep medication helpful.  Good appetite, normal energy level, good concentration.  Rated depression and hopeless 1, anxiety 2.  Denied withdrawals.  Denied SI.  Denied physical problems.  Denied physical pain.  Goal is discharge plan.  Plans to talk to MD/SW.  No discharge plans. A:  Medications administered per MD orders.  Emotional support and encouragement given patient. R:  Denied SI and HI, contracts for safety.  Denied a/V hallucinations.  Safety maintained with 15 minute checks.

## 2021-05-08 NOTE — Progress Notes (Signed)
°  [facility] Adult Case Management Discharge Plan :  Will you be returning to the same living situation after discharge:  Yes,  apartment At discharge, do you have transportation home?: Yes,  boyfriend Do you have the ability to pay for your medications: Yes,  private insurance  Release of information consent forms completed and in the chart;  Patient's signature needed at discharge.  Patient to Follow up at:  Follow-up Information     [facility] Follow up on 05/08/2021.   Why: You have an appointment for medication management services on 05/12/21 at 9:40 am. Please call to reschedule your therapy appointment as it was orginially scheduled for 05/08/21 and we were unable to reach them prior to your discharge.  These are Virtual appointments. Contact information: [address] [place], [place] [address]  Phone: [phone]                Next level of care provider has access to [facility] Link:no  Safety Planning and Suicide Prevention discussed: Yes,  mother     Has patient been referred to the Quitline?: N/A patient is not a smoker  Patient has been referred for addiction treatment: N/A  Felizardo Hoffmann, LCSWA 05/08/2021, 11:04 AM

## 2021-05-08 NOTE — Plan of Care (Signed)
Nurse discussed coping skills with patient.  

## 2021-05-08 NOTE — BHH Suicide Risk Assessment (Signed)
[name] Discharge Suicide Risk Assessment   Principal Problem: Bipolar 1 disorder, depressed, severe (Sabrina Bennett) Discharge Diagnoses: Principal Problem:   Bipolar 1 disorder, depressed, severe (Sabrina Bennett) Active Problems:   Generalized anxiety disorder   POTS (postural orthostatic tachycardia syndrome)   Subclinical hypothyroidism   PTSD (post-traumatic stress disorder)   OCD (obsessive compulsive disorder)   Panic disorder   Total Time spent with patient: 87 minutes  Sabrina Bennett is a 21 year old female with a reported psychiatric history of bipolar disorder, depression, GAD, OCD, ADHD- inattentive type, ARFID, and binge-eating disorder, as well as a medical history of hypothyroidism and presumed POTS who presented to [name] as a walk-in and admitted voluntarily for worsening depression and SI with plan to shoot herself.   During the patient's hospitalization, patient had extensive initial psychiatric evaluation, and follow-up psychiatric evaluations every day.  Psychiatric diagnoses provided upon initial assessment:  Bipolar 1 disorder, current episode depressed, severe PTSD Generalized anxiety disorder Panic disorder OCD  Patient's psychiatric medications were adjusted on admission:  -- Initiate Zyprexa 5 mg qHS for mood stabilization and psychotic features -- Initiate Prozac 10 mg daily for depression  -- Increase home Lithium to 450 mg BID -- Tapering home Vraylar: 3 mg daily x 2 doses, 1.5 mg daily x 2 doses, then discontinue -- Initiate Metformin 500 mg daily with breakfast for antipsychotic-induced weight gain.   During the hospitalization, other adjustments were made to the patient's psychiatric medication regimen:  -Zyprexa was continued at 5 mg qhs. Vraylar was discontinued.  -prozac was increased to 20 mg once daily -lithium was continued at 450 mg bid -metformin was continued -topiramate was started at 25 mg bid  -synthroid was increased to 75 mcg once daily   Gradually, patient  started adjusting to milieu.   Patient's care was discussed during the interdisciplinary team meeting every day during the hospitalization.  The patient denied having side effects to prescribed psychiatric medication.  The patient reports their target psychiatric symptoms of depression, anxiety, mood instability, and suicidal thoughts, all responded well to the psychiatric medications, and the patient reports overall benefit other psychiatric hospitalization. Supportive psychotherapy was provided to the patient. The patient also participated in regular group therapy while admitted.   Labs were reviewed with the patient, and abnormal results were discussed with the patient.  The patient denied having suicidal thoughts more than 48 hours prior to discharge.  Patient denies having homicidal thoughts.  Patient denies having auditory hallucinations.  Patient denies any visual hallucinations.  Patient denies having paranoid thoughts.  The patient is able to verbalize their individual safety plan to this provider.  It is recommended to the patient to continue psychiatric medications as prescribed, after discharge from the hospital.    It is recommended to the patient to follow up with your outpatient psychiatric provider and PCP.  Discussed with the patient, the impact of alcohol, drugs, tobacco have been there overall psychiatric and medical wellbeing, and total abstinence from substance use was recommended the patient.     Musculoskeletal: Strength & Muscle Tone: within normal limits Gait & Station: normal Patient leans: N/A  Psychiatric Specialty Exam  Presentation  General Appearance: Appropriate for Environment; Casual; Well Groomed  Eye Contact:Good  Speech:Clear and Coherent; Normal Rate  Speech Volume:Normal  Handedness:No data recorded  Mood and Affect  Mood:Euthymic  Duration of Depression Symptoms: No data recorded Affect:Appropriate; Congruent; Full Range   Thought  Process  Thought Processes:Coherent; Goal Directed; Linear  Descriptions of Associations:Intact  Orientation:Full (  Time, Place and Person)  Thought Content:Logical  History of Schizophrenia/Schizoaffective disorder:No data recorded Duration of Psychotic Symptoms:No data recorded Hallucinations:Hallucinations: None  Ideas of Reference:None  Suicidal Thoughts:Suicidal Thoughts: No  Homicidal Thoughts:Homicidal Thoughts: No   Sensorium  Memory:Immediate Good; Recent Good; Remote Good  Judgment:Good  Insight:Good   Executive Functions  Concentration:Good  Attention Span:Good  [place] of Knowledge:Good  Language:Good   Psychomotor Activity  Psychomotor Activity:Psychomotor Activity: Normal   Assets  Assets:Communication Skills; Physical Health   Sleep  Sleep:Sleep: Good   Physical Exam: Physical Exam Vitals reviewed.  Constitutional:      General: She is not in acute distress.    Appearance: She is normal weight. She is not toxic-appearing.  Pulmonary:     Effort: Pulmonary effort is normal. No respiratory distress.  Neurological:     Mental Status: She is alert.     Motor: No weakness.     Gait: Gait normal.  Psychiatric:        Mood and Affect: Mood normal.        Behavior: Behavior normal.        Thought Content: Thought content normal.        Judgment: Judgment normal.   Review of Systems  Constitutional:  Negative for chills and fever.  Cardiovascular:  Negative for chest pain and palpitations.  Neurological:  Negative for dizziness, tingling, tremors and headaches.  Psychiatric/Behavioral:  Negative for depression, hallucinations, memory loss, substance abuse and suicidal ideas. The patient is not nervous/anxious and does not have insomnia.   All other systems reviewed and are negative. Blood pressure 98/63, pulse (!) 118, temperature (!) 97.5 F (36.4 C), temperature source Oral, resp. rate 18, height $RemoveBe'5\' 8"'iMsSaCMZM$  (1.727 m), weight  89.6 kg, last menstrual period 03/02/2021, SpO2 100 %. Body mass index is 30.04 kg/m.  Mental Status Per Nursing Assessment::   On Admission:  Suicidal ideation indicated by patient, Plan includes specific time, place, or method, Suicide plan  Demographic factors:  Caucasian, Access to firearms Loss Factors:  NA Historical Factors:  Prior suicide attempts Risk Reduction Factors:  Positive social support, Employed, Living with another person, especially a relative  Continued Clinical Symptoms:  Bipolar disorder - mood is stable. Depression has resolved. Appetite and sleep are stable. Anxiety is less. Ocd is less. [place] having SI.   Cognitive Features That Contribute To Risk:  None    Suicide Risk:  Minimal: No identifiable suicidal ideation.  Patients presenting with no risk factors but with morbid ruminations; may be classified as minimal risk based on the severity of the depressive symptoms   Follow-up Information     [facility] Follow up on 05/08/2021.   Why: Reschedule appointment on 05/08/21 at 8:00 am for therapy services, and also for medication management services on 05/12/21 at 9:40 am.  These are Virtual appointments. Contact information: [address] [place], [place] [address]  Phone: [phone]                Plan Of Care/Follow-up recommendations:   Activity: as tolerated  Diet: heart healthy  Other: -Follow-up with your outpatient psychiatric provider -instructions on appointment date, time, and address (location) are provided to you in discharge paperwork.  -Take your psychiatric medications as prescribed at discharge - instructions are provided to you in the discharge paperwork  -Follow-up with outpatient primary care doctor and other specialists -for management of chronic medical disease, including: POTS, hypothyroidism  -Testing: Follow-up with outpatient provider for lab  results:  05/07/2021: TSH 7.483 (high) Cr 1.07 (high), BUN 13  (wnl), eGFR >60 (wnl) Lithium lelvel 0.46 (low)  05-03-2021: HbA1c 5.0 Lipid panel wnl    -Recommend abstinence from alcohol, tobacco, and other illicit drug use at discharge.   -If your psychiatric symptoms recur, worsening, or if you have side effects to your psychiatric medications, call your outpatient psychiatric provider, 911, 988 or go to the nearest emergency department.  -If suicidal thoughts recur, call your outpatient psychiatric provider, 911, 988 or go to the nearest emergency department.   Christoper Allegra, MD 05/08/2021, 10:42 AM

## 2021-05-08 NOTE — Discharge Summary (Signed)
Physician Discharge Summary Note  Patient:  Sabrina Bennett is an 21 y.o., female MRN:  [id number] DOB:  July 26, 2000 Patient phone:  [phone] (home)  Patient address:   [place] [address],  Total Time spent with patient: 20 minutes  Date of Admission:  05/03/2021 Date of Discharge: 05/08/2020  Reason for Admission:    Sabrina Bennett is a 21 year old female with a reported psychiatric history of bipolar disorder, depression, GAD, OCD, ADHD- inattentive type, ARFID, and binge-eating disorder, as well as a medical history of hypothyroidism and presumed POTS who presented to [facility] as a walk-in and admitted voluntarily for worsening depression and SI with plan to shoot herself.   Principal Problem: Bipolar 1 disorder, depressed, severe (Sabrina Bennett) Discharge Diagnoses: Principal Problem:   Bipolar 1 disorder, depressed, severe ([facility]) Active Problems:   Generalized anxiety disorder   POTS (postural orthostatic tachycardia syndrome)   Subclinical hypothyroidism   PTSD (post-traumatic stress disorder)   OCD (obsessive compulsive disorder)   Panic disorder   Past Psychiatric History:  Previous Psych Diagnoses: See HPI Prior inpatient treatment: Denies Current/prior outpatient treatment/psychotherapy: Yes Prior rehab hx: Denies History of suicide: Denies attempt; history of cutting History of homicide: Denies Psychiatric medication history: Lamictal 100 mg, Zoloft 200 mg, Zyprexa 2.5 mg (14 lb weight gain), Vyvanse, Vraylar 6 mg (since 07/2020), Lithium 300 mg Psychiatric medication compliance history: Compliant Neuromodulation history: Denies Current Psychiatrist and therapist:(John Arnell Asal) and therapist Alvira Philips) through [facility] in [place]  Past Medical History: History reviewed. No pertinent past medical history. History reviewed. No pertinent surgical history. Medical Diagnoses: See HPI Home Rx: Levothyroxine 50 mcg, Midodrine 2.5 mg  BID Prior Hosp: Denies Prior Surgeries/Trauma: Denies Head trauma, LOC, concussions, seizures: Documented head trauma in cheerleading accident without LOC Allergies: Ferrous sulfate- nausea LMP: 03/02/21 Contraception: OCP  Family History: History reviewed. No pertinent family history. Medical: Mom with hypothyroidism Psych: Mom and younger brother with ADHD; older brother with depression/anxiety Psych Rx: Unknown SA/HA: Denies Substance use family hx: Dad- alcohol use disorder  Social History:  Childhood: 2 parent household with 2 brothers; witnessed parents arguing when dad inebriated Abuse: See HPI Marital Status: Single Sexual orientation: Heterosexual  Children: None Employment: Daycare Education: Current junior at [employer]: Lives with boyfriend Legal: Denies Nature conservation officer: Denies Social History   Substance and Sexual Activity  Alcohol Use Yes     Social History   Substance and Sexual Activity  Drug Use Never    Social History   Socioeconomic History   Marital status: Single    Spouse name: Not on file   Number of children: Not on file   Years of education: Not on file   Highest education level: Not on file  Occupational History   Not on file  Tobacco Use   Smoking status: Never    Passive exposure: Never   Smokeless tobacco: Never  Vaping Use   Vaping Use: Never used  Substance and Sexual Activity   Alcohol use: Yes   Drug use: Never   Sexual activity: Yes    Birth control/protection: Pill, Condom  Other Topics Concern   Not on file  Social History Narrative   Not on file   Social Determinants of Health   Financial Resource Strain: Not on file  Food Insecurity: Not on file  Transportation Needs: Not on file  Physical Activity: Not on file  Stress: Not on file  Social Connections: Not on file    Hospital  Course:    During the patient's hospitalization, patient had extensive initial psychiatric evaluation, and follow-up  psychiatric evaluations every day.  Psychiatric diagnoses provided upon initial assessment:  Bipolar 1 disorder, current episode depressed, severe PTSD Generalized anxiety disorder Panic disorder OCD  Patient's psychiatric medications were adjusted on admission:  -- Initiate Zyprexa 5 mg qHS for mood stabilization and psychotic features -- Initiate Prozac 10 mg daily for depression  -- Increase home Lithium to 450 mg BID -- Tapering home Vraylar: 3 mg daily x 2 doses, 1.5 mg daily x 2 doses, then discontinue -- Initiate Metformin 500 mg daily with breakfast for antipsychotic-induced weight gain.   During the hospitalization, other adjustments were made to the patient's psychiatric medication regimen:  -Zyprexa was continued at 5 mg qhs. Vraylar was discontinued.  -prozac was increased to 20 mg once daily -lithium was continued at 450 mg bid -metformin was continued -topiramate was started at 25 mg bid  -synthroid was increased to 75 mcg once daily   Patient's care was discussed during the interdisciplinary team meeting every day during the hospitalization.  The patient denied having side effects to prescribed psychiatric medication.  Gradually, patient started adjusting to milieu. The patient was evaluated each day by a clinical provider to ascertain response to treatment. Improvement was noted by the patient's report of decreasing symptoms, improved sleep and appetite, affect, medication tolerance, behavior, and participation in unit programming.  Patient was asked each day to complete a self inventory noting mood, mental status, pain, new symptoms, anxiety and concerns.   Symptoms were reported as significantly decreased or resolved completely by discharge.  The patient reports that their mood is stable.  The patient denied having suicidal thoughts for more than 48 hours prior to discharge.  Patient denies having homicidal thoughts.  Patient denies having auditory hallucinations.   Patient denies any visual hallucinations or other symptoms of psychosis.  The patient was motivated to continue taking medication with a goal of continued improvement in mental health.   The patient reports their target psychiatric symptoms of depression, anxiety, mood instability, and suicidal thoughts, all responded well to the psychiatric medications, and the patient reports overall benefit other psychiatric hospitalization. Supportive psychotherapy was provided to the patient. The patient also participated in regular group therapy while hospitalized. Coping skills, problem solving as well as relaxation therapies were also part of the unit programming.  Labs were reviewed with the patient, and abnormal results were discussed with the patient.  The patient is able to verbalize their individual safety plan to this provider.  # It is recommended to the patient to continue psychiatric medications as prescribed, after discharge from the hospital.    # It is recommended to the patient to follow up with your outpatient psychiatric provider and PCP.  # It was discussed with the patient, the impact of alcohol, drugs, tobacco have been there overall psychiatric and medical wellbeing, and total abstinence from substance use was recommended the patient.ed.  # Prescriptions provided or sent directly to preferred pharmacy at discharge. Patient agreeable to plan. Given opportunity to ask questions. Appears to feel comfortable with discharge.    # In the event of worsening symptoms, the patient is instructed to call the crisis hotline, 911 and or go to the nearest ED for appropriate evaluation and treatment of symptoms. To follow-up with primary care provider for other medical issues, concerns and or health care needs  # Patient was discharged home with boyfriend, with a plan to follow  up as noted below.   Physical Findings: AIMS:  , ,  ,  ,    AIMS score zero on 05-08-2020 CIWA:    COWS:      Musculoskeletal: Strength & Muscle Tone: within normal limits Gait & Station: normal Patient leans: N/A   Psychiatric Specialty Exam:  Presentation  General Appearance: Appropriate for Environment; Casual; Well Groomed  Eye Contact:Good  Speech:Clear and Coherent; Normal Rate  Speech Volume:Normal  Handedness:No data recorded  Mood and Affect  Mood:Euthymic  Affect:Appropriate; Congruent; Full Range   Thought Process  Thought Processes:Coherent; Goal Directed; Linear  Descriptions of Associations:Intact  Orientation:Full (Time, Place and Person)  Thought Content:Logical  History of Schizophrenia/Schizoaffective disorder:No data recorded Duration of Psychotic Symptoms:No data recorded Hallucinations:Hallucinations: None  Ideas of Reference:None  Suicidal Thoughts:Suicidal Thoughts: No  Homicidal Thoughts:Homicidal Thoughts: No   Sensorium  Memory:Immediate Good; Recent Good; Remote Good  Judgment:Good  Insight:Good   Executive Functions  Concentration:Good  Attention Span:Good  [place] of Knowledge:Good  Language:Good   Psychomotor Activity  Psychomotor Activity:Psychomotor Activity: Normal   Assets  Assets:Communication Skills; Physical Health   Sleep  Sleep:Sleep: Good    Physical Exam: Physical Exam Vitals reviewed.  Constitutional:      Appearance: She is normal weight. She is not toxic-appearing.  Pulmonary:     Effort: Pulmonary effort is normal.  Neurological:     Mental Status: She is alert.     Motor: No weakness.     Gait: Gait normal.  Psychiatric:        Mood and Affect: Mood normal.        Behavior: Behavior normal.        Thought Content: Thought content normal.        Judgment: Judgment normal.   Review of Systems  Constitutional:  Negative for chills and fever.  Cardiovascular:  Negative for palpitations.  Musculoskeletal:  Negative for myalgias.  Neurological:  Negative for dizziness,  tingling, tremors and headaches.  Psychiatric/Behavioral:  Negative for depression, hallucinations, memory loss, substance abuse and suicidal ideas. The patient is not nervous/anxious and does not have insomnia.   All other systems reviewed and are negative.  Blood pressure 98/63, pulse (!) 118, temperature (!) 97.5 F (36.4 C), temperature source Oral, resp. rate 18, height 5' 8"  (1.727 m), weight 89.6 kg, last menstrual period 03/02/2021, SpO2 100 %. Body mass index is 30.04 kg/m.   Social History   Tobacco Use  Smoking Status Never   Passive exposure: Never  Smokeless Tobacco Never   Tobacco Cessation:  N/A, patient does not currently use tobacco products   Blood Alcohol level:  No results found for: [facility]  Metabolic Disorder Labs:  Lab Results  Component Value Date   HGBA1C 5.0 05/03/2021   MPG 96.8 05/03/2021   No results found for: PROLACTIN Lab Results  Component Value Date   CHOL 140 05/03/2021   TRIG 47 05/03/2021   HDL 47 05/03/2021   CHOLHDL 3.0 05/03/2021   VLDL 9 05/03/2021   [place] 84 05/03/2021    See Psychiatric Specialty Exam and Suicide Risk Assessment completed by Attending Physician prior to discharge.  Discharge destination:  Other:  home with boyfriend  Is patient on multiple antipsychotic therapies at discharge:  No   Has Patient had three or more failed trials of antipsychotic monotherapy by history:  No  Recommended Plan for Multiple Antipsychotic Therapies: NA  Discharge Instructions     Diet - low sodium heart healthy  Complete by: As directed    Increase activity slowly   Complete by: As directed       Allergies as of 05/08/2021       Reactions   Ferrous Sulfate Nausea Only, Other (See Comments)   Headaches        Medication List     STOP taking these medications    [place]lyta 42 MG capsule Generic drug: lumateperone tosylate   Vraylar 6 MG Caps Generic drug: Cariprazine HCl       TAKE these medications       Indication  FLUoxetine 20 MG capsule Commonly known as: PROZAC Take 1 capsule (20 mg total) by mouth daily. Start taking on: May 09, 2021  Indication: Depressive Phase of Manic-Depression   hydrOXYzine 25 MG tablet Commonly known as: ATARAX Take 1 tablet (25 mg total) by mouth 3 (three) times daily as needed for anxiety.  Indication: Feeling Anxious   levothyroxine 75 MCG tablet Commonly known as: SYNTHROID Take 1 tablet (75 mcg total) by mouth daily. Start taking on: May 09, 2021 What changed:  medication strength how much to take Another medication with the same name was removed. Continue taking this medication, and follow the directions you see here.  Indication: Underactive Thyroid   lithium carbonate 450 MG CR tablet Commonly known as: ESKALITH Take 1 tablet (450 mg total) by mouth 2 (two) times daily. What changed:  medication strength how much to take  Indication: Manic-Depression   metFORMIN 500 MG tablet Commonly known as: GLUCOPHAGE Take 1 tablet (500 mg total) by mouth daily with breakfast. Start taking on: May 09, 2021  Indication: Antipsychotic Therapy-Induced Weight Gain   midodrine 2.5 MG tablet Commonly known as: PROAMATINE Take 2.5 mg by mouth 2 (two) times daily.  Indication: Postural Orthostatic Tachycardia   OLANZapine 5 MG tablet Commonly known as: ZYPREXA Take 1 tablet (5 mg total) by mouth at bedtime.  Indication: Depressive Phase of Manic-Depression   Simpesse 0.15-0.03 &0.01 MG tablet Generic drug: Levonorgestrel-Ethinyl Estradiol Take 1 tablet by mouth at bedtime.  Indication: Birth Control Treatment   topiramate 25 MG tablet Commonly known as: TOPAMAX Take 1 tablet (25 mg total) by mouth 2 (two) times daily.  Indication: Antipsychotic Therapy-Induced Weight Gain   traZODone 50 MG tablet Commonly known as: DESYREL Take 1 tablet (50 mg total) by mouth at bedtime as needed for sleep.  Indication: [place] Follow up on 05/08/2021.   Why: Reschedule appointment on 05/08/21 at 8:00 am for therapy services, and also for medication management services on 05/12/21 at 9:40 am.  These are Virtual appointments. Contact information: [address] [place], [place] [address]  Phone: [phone]                Follow-up recommendations:    Activity: as tolerated   Diet: heart healthy   Other: -Follow-up with your outpatient psychiatric provider -instructions on appointment date, time, and address (location) are provided to you in discharge paperwork.   -Take your psychiatric medications as prescribed at discharge - instructions are provided to you in the discharge paperwork   -Follow-up with outpatient primary care doctor and other specialists -for management of chronic medical disease, including: POTS, hypothyroidism   -Testing: Follow-up with outpatient provider for lab results:  05/07/2021: TSH 7.483 (high) Cr 1.07 (high), BUN 13 (wnl), eGFR >60 (wnl) Lithium lelvel 0.46 (low)   05-03-2021:  HbA1c 5.0 Lipid panel wnl      -Recommend abstinence from alcohol, tobacco, and other illicit drug use at discharge.    -If your psychiatric symptoms recur, worsening, or if you have side effects to your psychiatric medications, call your outpatient psychiatric provider, 911, 988 or go to the nearest emergency department.   -If suicidal thoughts recur, call your outpatient psychiatric provider, 911, 988 or go to the nearest emergency department.   Signed: Christoper Allegra, MD 05/08/2021, 10:42 AM

## 2021-05-08 NOTE — BHH Counselor (Signed)
Events affecting the discharge plan:  -Awaiting MD Interventions by CSW: -Therapy appointment was rescheduled due to pt still being hospitalized.  Emotional response of the patient/family to the plan of care: -N/A   Toney Reil, [place]

## 2021-05-08 NOTE — BH IP Treatment Plan (Signed)
Interdisciplinary Treatment and Diagnostic Plan Update  05/08/2021 Sabrina Bennett MRN: [id number]  Principal Diagnosis: Bipolar 1 disorder, depressed, severe ([place])  Secondary Diagnoses: Principal Problem:   Bipolar 1 disorder, depressed, severe ([place]) Active Problems:   Generalized anxiety disorder   POTS (postural orthostatic tachycardia syndrome)   Subclinical hypothyroidism   PTSD (post-traumatic stress disorder)   OCD (obsessive compulsive disorder)   Panic disorder   Current Medications:  Current Facility-Administered Medications  Medication Dose Route Frequency Provider Last Rate Last Admin   acetaminophen (TYLENOL) tablet 650 mg  650 mg Oral Q6H PRN Prescilla Sours, PA-C   650 mg at 05/03/21 1124   alum & mag hydroxide-simeth (MAALOX/MYLANTA) 200-200-20 MG/5ML suspension 30 mL  30 mL Oral Q4H PRN Prescilla Sours, PA-C       feeding supplement (ENSURE ENLIVE / ENSURE PLUS) liquid 237 mL  237 mL Oral BID BM Lovena Le, Cody W, PA-C   237 mL at 05/07/21 1003   FLUoxetine (PROZAC) capsule 20 mg  20 mg Oral Daily Massengill, Nathan, MD   20 mg at 05/08/21 0806   hydrOXYzine (ATARAX) tablet 25 mg  25 mg Oral TID PRN Prescilla Sours, PA-C   25 mg at 05/07/21 2119   Levonorgestrel-Ethinyl Estradiol (AMETHIA) 0.15-0.03 &0.01 MG tablet 1 tablet  1 tablet Oral QHS Prescilla Sours, PA-C   1 tablet at 05/07/21 2120   levothyroxine (SYNTHROID) tablet 75 mcg  75 mcg Oral Daily Maida Sale, MD   75 mcg at 05/08/21 [id number]   lithium carbonate (ESKALITH) CR tablet 450 mg  450 mg Oral BID Massengill, Ovid Curd, MD   450 mg at 05/08/21 0806   magnesium hydroxide (MILK OF MAGNESIA) suspension 30 mL  30 mL Oral Daily PRN [name] W, PA-C       metFORMIN (GLUCOPHAGE) tablet 500 mg  500 mg Oral Q breakfast Rosezetta Schlatter, MD   500 mg at 05/08/21 0805   midodrine (PROAMATINE) tablet 2.5 mg  2.5 mg Oral BID [name] W, PA-C   2.5 mg at 05/08/21 0805   OLANZapine (ZYPREXA) tablet 5 mg  5 mg Oral QHS  Massengill, Nathan, MD   5 mg at 05/07/21 2119   ondansetron (ZOFRAN-ODT) disintegrating tablet 4 mg  4 mg Oral Q8H PRN Massengill, Ovid Curd, MD   4 mg at 05/05/21 1121   topiramate (TOPAMAX) tablet 25 mg  25 mg Oral BID Briant Cedar, MD   25 mg at 05/08/21 0806   traZODone (DESYREL) tablet 50 mg  50 mg Oral QHS PRN Prescilla Sours, PA-C   50 mg at 05/07/21 2119   PTA Medications: Medications Prior to Admission  Medication Sig Dispense Refill Last Dose   CAPLYTA 42 MG capsule Take 42 mg by mouth at bedtime.   not started yet   levothyroxine (SYNTHROID) 50 MCG tablet Take 50 mcg by mouth daily.   05/01/2021   lithium carbonate (LITHOBID) 300 MG CR tablet Take 300 mg by mouth 2 (two) times daily.   05/01/2021   midodrine (PROAMATINE) 2.5 MG tablet Take 2.5 mg by mouth 2 (two) times daily.   05/01/2021   SIMPESSE 0.15-0.03 &0.01 MG tablet Take 1 tablet by mouth at bedtime.   05/01/2021   VRAYLAR 6 MG CAPS Take 6 mg by mouth at bedtime.   05/01/2021    Patient Stressors: Marital or family conflict   Medication change or noncompliance    Patient Strengths: Average or above average intelligence  General fund of  knowledge  Motivation for treatment/growth  Supportive family/friends   Treatment Modalities: Medication Management, Group therapy, Case management,  1 to 1 session with clinician, Psychoeducation, Recreational therapy.   Physician Treatment Plan for Primary Diagnosis: Bipolar 1 disorder, depressed, severe ([place]) Long Term Goal(s): Improvement in symptoms so as ready for discharge   Short Term Goals: Ability to identify changes in lifestyle to reduce recurrence of condition will improve Ability to verbalize feelings will improve Ability to disclose and discuss suicidal ideas Ability to demonstrate self-control will improve Ability to identify and develop effective coping behaviors will improve Ability to maintain clinical measurements within normal limits will  improve Compliance with prescribed medications will improve  Medication Management: Evaluate patient's response, side effects, and tolerance of medication regimen.  Therapeutic Interventions: 1 to 1 sessions, Unit Group sessions and Medication administration.  Evaluation of Outcomes: Progressing  Physician Treatment Plan for Secondary Diagnosis: Principal Problem:   Bipolar 1 disorder, depressed, severe ([place]) Active Problems:   Generalized anxiety disorder   POTS (postural orthostatic tachycardia syndrome)   Subclinical hypothyroidism   PTSD (post-traumatic stress disorder)   OCD (obsessive compulsive disorder)   Panic disorder  Long Term Goal(s): Improvement in symptoms so as ready for discharge   Short Term Goals: Ability to identify changes in lifestyle to reduce recurrence of condition will improve Ability to verbalize feelings will improve Ability to disclose and discuss suicidal ideas Ability to demonstrate self-control will improve Ability to identify and develop effective coping behaviors will improve Ability to maintain clinical measurements within normal limits will improve Compliance with prescribed medications will improve     Medication Management: Evaluate patient's response, side effects, and tolerance of medication regimen.  Therapeutic Interventions: 1 to 1 sessions, Unit Group sessions and Medication administration.  Evaluation of Outcomes: Progressing   RN Treatment Plan for Primary Diagnosis: Bipolar 1 disorder, depressed, severe ([place]) Long Term Goal(s): Knowledge of disease and therapeutic regimen to maintain health will improve  Short Term Goals: Ability to demonstrate self-control, Ability to participate in decision making will improve, and Ability to verbalize feelings will improve  Medication Management: RN will administer medications as ordered by provider, will assess and evaluate patient's response and provide education to patient for prescribed  medication. RN will report any adverse and/or side effects to prescribing provider.  Therapeutic Interventions: 1 on 1 counseling sessions, Psychoeducation, Medication administration, Evaluate responses to treatment, Monitor vital signs and CBGs as ordered, Perform/monitor CIWA, COWS, AIMS and Fall Risk screenings as ordered, Perform wound care treatments as ordered.  Evaluation of Outcomes: Progressing   LCSW Treatment Plan for Primary Diagnosis: Bipolar 1 disorder, depressed, severe ([place]) Long Term Goal(s): Safe transition to appropriate next level of care at discharge, Engage patient in therapeutic group addressing interpersonal concerns.  Short Term Goals: Engage patient in aftercare planning with referrals and resources, Increase social support, and Increase ability to appropriately verbalize feelings  Therapeutic Interventions: Assess for all discharge needs, 1 to 1 time with Social worker, Explore available resources and support systems, Assess for adequacy in community support network, Educate family and significant other(s) on suicide prevention, Complete Psychosocial Assessment, Interpersonal group therapy.  Evaluation of Outcomes: Progressing   Progress in Treatment: Attending groups: Yes. Participating in groups: Yes. Taking medication as prescribed: Yes. Toleration medication: Yes. Family/Significant other contact made: Yes, individual(s) contacted:  mother Patient understands diagnosis: Yes. Discussing patient identified problems/goals with staff: Yes. Medical problems stabilized or resolved: Yes. Denies suicidal/homicidal ideation: Yes. Issues/concerns per patient self-inventory: No. Other:  None  New problem(s) identified: No, Describe:  None  New Short Term/Long Term Goal(s):medication stabilization, elimination of SI thoughts, development of comprehensive mental wellness plan.   Patient Goals:  Did Not Attend  Discharge Plan or Barriers: Pt will follow up at [facility] in [place] for Therapy and Med Management services.  Reason for Continuation of Hospitalization: Depression Medication stabilization Suicidal ideation  Estimated Length of Stay: 3-5 days   Scribe for Treatment Team: Eliott Nine 05/08/2021 9:56 AM

## 2021-08-01 ENCOUNTER — Other Ambulatory Visit: Payer: Self-pay

## 2021-08-01 ENCOUNTER — Encounter (HOSPITAL_COMMUNITY): Payer: Self-pay

## 2021-08-01 ENCOUNTER — Emergency Department (HOSPITAL_COMMUNITY)
Admission: EM | Admit: 2021-08-01 | Discharge: 2021-08-01 | Disposition: A | Discharge status: Home / Self Care | Payer: 59 | Attending: Emergency Medicine | Admitting: Emergency Medicine

## 2021-08-01 DIAGNOSIS — J069 Acute upper respiratory infection, unspecified: Secondary | ICD-10-CM | POA: Insufficient documentation

## 2021-08-01 DIAGNOSIS — H6502 Acute serous otitis media, left ear: Secondary | ICD-10-CM | POA: Insufficient documentation

## 2021-08-01 DIAGNOSIS — R519 Headache, unspecified: Secondary | ICD-10-CM

## 2021-08-01 DIAGNOSIS — Z20822 Contact with and (suspected) exposure to covid-19: Secondary | ICD-10-CM | POA: Diagnosis not present

## 2021-08-01 DIAGNOSIS — J029 Acute pharyngitis, unspecified: Secondary | ICD-10-CM | POA: Diagnosis present

## 2021-08-01 LAB — RESP PANEL BY RT-PCR (FLU A&B, COVID) ARPGX2
Influenza A by PCR: NEGATIVE
Influenza B by PCR: NEGATIVE
SARS Coronavirus 2 by RT PCR: NEGATIVE

## 2021-08-01 MED ORDER — AMOXICILLIN 500 MG PO CAPS: 500.0000 mg | ORAL_CAPSULE | Freq: Three times a day (TID) | ORAL | 0 refills | Status: DC

## 2021-08-01 MED ORDER — DIPHENHYDRAMINE HCL 50 MG/ML IJ SOLN
25.0000 mg | Freq: Once | INTRAMUSCULAR | Status: AC
  Administered 2021-08-01: 25 mg via INTRAVENOUS
  Filled 2021-08-01: qty 1

## 2021-08-01 MED ORDER — METOCLOPRAMIDE HCL 5 MG/ML IJ SOLN
10.0000 mg | Freq: Once | INTRAMUSCULAR | Status: AC
  Administered 2021-08-01: 10 mg via INTRAVENOUS
  Filled 2021-08-01: qty 2

## 2021-08-01 MED ORDER — KETOROLAC TROMETHAMINE 15 MG/ML IJ SOLN
15.0000 mg | Freq: Once | INTRAMUSCULAR | Status: AC
  Administered 2021-08-01: 15 mg via INTRAVENOUS
  Filled 2021-08-01: qty 1

## 2021-08-01 NOTE — Discharge Instructions (Addendum)
Please read and follow all provided instructions. ? ?Your diagnoses today include:  ?1. Acute nonintractable headache, unspecified headache type   ?2. Upper respiratory tract infection, unspecified type   ?3. Non-recurrent acute serous otitis media of left ear   ? ? ?Tests performed today include: ?COVID/flu testing: Negative ?Vital signs. See below for your results today.  ? ?Medications:  ?In the Emergency Department you received: ?Reglan - antinausea/headache medication ?Benadryl - antihistamine to counteract potential side effects of reglan ?Toradol - NSAID medication similar to ibuprofen ? ?Amoxicillin - antibiotic ? ?You have been prescribed an antibiotic medicine: take the entire course of medicine even if you are feeling better. Stopping early can cause the antibiotic not to work. ? ?Only fill this antibiotic if you worsen or continue to have symptoms in 72 hours. If filled, take entire course of antibiotics as directed and follow-up with your doctor. ? ?Take any prescribed medications only as directed. ? ?Additional information:  ?Follow any educational materials contained in this packet. ? ?You are having a headache. No specific cause was found today for your headache. It may have been a migraine or other cause of headache. Stress, anxiety, fatigue, and depression are common triggers for headaches.  ? ?Your headache today does not appear to be life-threatening or require hospitalization, but often the exact cause of headaches is not determined in the emergency department. Therefore, follow-up with your doctor is very important to find out what may have caused your headache and whether or not you need any further diagnostic testing or treatment.  ? ?Sometimes headaches can appear benign (not harmful), but then more serious symptoms can develop which should prompt an immediate re-evaluation by your doctor or the emergency department. ? ?BE VERY CAREFUL not to take multiple medicines containing Tylenol (also  called acetaminophen). Doing so can lead to an overdose which can damage your liver and cause liver failure and possibly death.  ? ?Follow-up instructions: ?Please follow-up with your primary care provider in the next 3 days for further evaluation of your symptoms.  ? ?Return instructions:  ?Please return to the Emergency Department if you experience worsening symptoms. ?Return if the medications do not resolve your headache, if it recurs, or if you have multiple episodes of vomiting or cannot keep down fluids. ?Return if you have a change from the usual headache. ?RETURN IMMEDIATELY IF you: ?Develop a sudden, severe headache ?Develop confusion or become poorly responsive or faint ?Develop a fever above 100.58F or problem breathing ?Have a change in speech, vision, swallowing, or understanding ?Develop new weakness, numbness, tingling, incoordination in your arms or legs ?Have a seizure ?Please return if you have any other emergent concerns. ? ?Additional Information: ? ?Your vital signs today were: ?BP 122/82   Pulse 88   Temp 97.7 ?F (36.5 ?C) (Oral)   Resp 20   Ht 5\' 8"  (1.727 m)   Wt 97.5 kg   SpO2 98%   BMI 32.69 kg/m?  ?If your blood pressure (BP) was elevated above 135/85 this visit, please have this repeated by your doctor within one month. ?-------------- ? ?

## 2021-08-01 NOTE — ED Provider Notes (Signed)
?[facility]-EMERGENCY DEPT ?Provider Note ? ? ?CSN: [id number] ?Arrival date & time: 08/01/21  0117 ? ?  ? ?History ? ?Chief Complaint  ?Patient presents with  ? Migraine  ? ? ?Sabrina Bennett is a 21 y.o. female. ? ?Patient presents to the emergency department for evaluation of headache.  She reports headache ongoing for the past 1 week.  This is described as a band around her head, pressure nature with radiation to her neck and back.  She states that she has been taking OTC meds at home with temporary improvement but not resolution.  No light or sound sensitivity.  No confusion, fevers or vomiting.  Yesterday morning, patient developed a sore throat.  She went to bed last night and awoke a short time ago with left ear pain prompting emergency department visit.  She also has crusting of her right eye.  No known sick contacts.  She denies head injuries.  No vision change, weakness in arms or legs. ? ? ?  ? ?Home Medications ?Prior to Admission medications   ?Medication Sig Start Date End Date Taking? Authorizing Provider  ?FLUoxetine (PROZAC) 20 MG capsule Take 1 capsule (20 mg total) by mouth daily. 05/09/21   Massengill, Harrold Donath, MD  ?hydrOXYzine (ATARAX) 25 MG tablet Take 1 tablet (25 mg total) by mouth 3 (three) times daily as needed for anxiety. 05/08/21   Massengill, Harrold Donath, MD  ?levothyroxine (SYNTHROID) 75 MCG tablet Take 1 tablet (75 mcg total) by mouth daily. 05/09/21   Massengill, Harrold Donath, MD  ?lithium carbonate (ESKALITH) 450 MG CR tablet Take 1 tablet (450 mg total) by mouth 2 (two) times daily. 05/08/21   Massengill, Harrold Donath, MD  ?metFORMIN (GLUCOPHAGE) 500 MG tablet Take 1 tablet (500 mg total) by mouth daily with breakfast. 05/09/21   Massengill, Harrold Donath, MD  ?midodrine (PROAMATINE) 2.5 MG tablet Take 2.5 mg by mouth 2 (two) times daily. 04/18/21   [provider]  ?OLANZapine (ZYPREXA) 5 MG tablet Take 1 tablet (5 mg total) by mouth at bedtime. 05/08/21   Massengill, Harrold Donath, MD   ?SIMPESSE 0.15-0.03 &0.01 MG tablet Take 1 tablet by mouth at bedtime. 05/02/21   [provider]  ?topiramate (TOPAMAX) 25 MG tablet Take 1 tablet (25 mg total) by mouth 2 (two) times daily. 05/08/21   Massengill, Harrold Donath, MD  ?traZODone (DESYREL) 50 MG tablet Take 1 tablet (50 mg total) by mouth at bedtime as needed for sleep. 05/08/21   Phineas Inches, MD  ?   ? ?Allergies    ?Ferrous sulfate   ? ?Review of Systems   ?Review of Systems ? ?Physical Exam ?Updated Vital Signs ?BP 96/82 (BP Location: Right Arm)   Pulse (!) 110   Temp 97.7 ?F (36.5 ?C) (Oral)   Resp 20   Ht 5\' 8"  (1.727 m)   Wt 97.5 kg   SpO2 99%   BMI 32.69 kg/m?  ?Physical Exam ?Vitals and nursing note reviewed.  ?Constitutional:   ?   General: She is not in acute distress. ?   Appearance: She is well-developed.  ?HENT:  ?   Head: Normocephalic and atraumatic.  ?   Right Ear: Tympanic membrane, ear canal and external ear normal. Tympanic membrane is not injected, erythematous or bulging.  ?   Left Ear: Ear canal and external ear normal. Tympanic membrane is injected, erythematous and bulging.  ?   Nose: Nose normal. No congestion or rhinorrhea.  ?   Mouth/Throat:  ?   Pharynx: Uvula midline. Posterior  oropharyngeal erythema present. No pharyngeal swelling, oropharyngeal exudate or uvula swelling.  ?Eyes:  ?   General: Lids are normal.     ?   Right eye: Discharge present.     ?   Left eye: No discharge.  ?   Extraocular Movements:  ?   Right eye: No nystagmus.  ?   Left eye: No nystagmus.  ?   Conjunctiva/sclera:  ?   Right eye: Right conjunctiva is injected. No chemosis or exudate. ?   Left eye: Left conjunctiva is not injected. No chemosis or exudate. ?   Pupils: Pupils are equal, round, and reactive to light.  ?Cardiovascular:  ?   Rate and Rhythm: Normal rate and regular rhythm.  ?   Heart sounds: No murmur heard. ?Pulmonary:  ?   Effort: Pulmonary effort is normal. No respiratory distress.  ?   Breath sounds: Normal breath  sounds. No wheezing, rhonchi or rales.  ?Abdominal:  ?   Palpations: Abdomen is soft.  ?   Tenderness: There is no abdominal tenderness. There is no guarding or rebound.  ?Musculoskeletal:  ?   Cervical back: Normal range of motion and neck supple. No tenderness or bony tenderness.  ?   Right lower leg: No edema.  ?   Left lower leg: No edema.  ?Skin: ?   General: Skin is warm and dry.  ?   Findings: No rash.  ?Neurological:  ?   General: No focal deficit present.  ?   Mental Status: She is alert and oriented to person, place, and time. Mental status is at baseline.  ?   GCS: GCS eye subscore is 4. GCS verbal subscore is 5. GCS motor subscore is 6.  ?   Cranial Nerves: No cranial nerve deficit.  ?   Sensory: No sensory deficit.  ?   Motor: No weakness.  ?   Coordination: Coordination normal.  ?   Gait: Gait normal.  ?   Comments: Upper extremity myotomes tested bilaterally:  ?C5 Shoulder abduction 5/5 ?C6 Elbow flexion/wrist extension 5/5 ?C7 Elbow extension 5/5 ?C8 Finger flexion 5/5 ?T1 Finger abduction 5/5 ? ?Lower extremity myotomes tested bilaterally: ?L2 Hip flexion 5/5 ?L3 Knee extension 5/5 ?L4 Ankle dorsiflexion 5/5 ?S1 Ankle plantar flexion 5/5 ?  ?Psychiatric:     ?   Mood and Affect: Mood normal.  ? ? ?ED Results / Procedures / Treatments   ?Labs ?(all labs ordered are listed, but only abnormal results are displayed) ?Labs Reviewed  ?RESP PANEL BY RT-PCR (FLU A&B, COVID) ARPGX2  ? ? ?EKG ?None ? ?Radiology ?No results found. ? ?Procedures ?Procedures  ? ? ?Medications Ordered in ED ?Medications  ?metoCLOPramide (REGLAN) injection 10 mg (10 mg Intravenous Given 08/01/21 0207)  ?diphenhydrAMINE (BENADRYL) injection 25 mg (25 mg Intravenous Given 08/01/21 0206)  ?ketorolac (TORADOL) 15 MG/ML injection 15 mg (15 mg Intravenous Given 08/01/21 0207)  ? ? ?ED Course/ Medical Decision Making/ A&P ?  ? ?Patient seen and examined. History obtained directly from patient.  She is well-appearing.    ? ?Medications/Fluids: Ordered: Migraine cocktail including IV Reglan, Benadryl, Toradol.  ? ?Most recent vital signs reviewed and are as follows: ?BP 96/82 (BP Location: Right Arm)   Pulse (!) 110   Temp 97.7 ?F (36.5 ?C) (Oral)   Resp 20   Ht 5\' 8"  (1.727 m)   Wt 97.5 kg   SpO2 99%   BMI 32.69 kg/m?  ? ?Initial impression: Symptoms consistent with tension headache and  URI with conjunctivitis and left otitis media. ? ?2:46 AM Reassessment performed. Patient appears comfortable, stable.  She states that she is feeling a bit better. ? ?Labs personally reviewed and interpreted including: Negative COVID, flu testing ? ?Reviewed pertinent lab work and imaging with patient at bedside. Questions answered.  ? ?Most current vital signs reviewed and are as follows: ?BP 122/82   Pulse 88   Temp 98.2 ?F (36.8 ?C)   Resp 20   Ht  (1.727 m)   Wt 97.5 kg   SpO2 98%   BMI 32.69 kg/m?  ? ?Plan: Discharge to home.  ? ?Prescriptions written for: Amoxicillin.  She is encouraged to treat symptomatically and fill this medication on a with worsening or if not improved in 48 to 72 hours. ? ?Other home care instructions discussed: OTC meds as needed, rest, good hydration ? ?ED return instructions discussed: Uncontrolled pain, severe headache, vomiting, confusion ? ?Follow-up instructions discussed: Patient encouraged to follow-up with their PCP in 5 days if not improved ? ? ?                        ?Medical Decision Making ?Risk ?Prescription drug management. ? ? ?URI symptoms, likely viral given combination of sore throat, conjunctivitis, and ear infection.  Antibiotics given for otitis media, encouraged wait-and-see approach given likelihood of viral infection. ? ? ?In regards to the patient's headache, critical differentials were considered including subarachnoid hemorrhage, intracerebral hemorrhage, epidural/subdural hematoma, pituitary apoplexy, vertebral/carotid artery dissection, giant cell arteritis, central  venous thrombosis, reversible cerebral vasoconstriction, acute angle closure glaucoma, idiopathic intracranial hypertension, bacterial meningitis, viral encephalitis, carbon monoxide poisoning, posterior reversible encephalopathy syndrome, pre-eclampsia.  ? ?

## 2021-08-01 NOTE — ED Triage Notes (Signed)
Patient presents to ED from home with c/o migraine x 1 week. Pt states today she developed left ear pain and a sore throat.  ?

## 2021-08-24 ENCOUNTER — Emergency Department (HOSPITAL_COMMUNITY)
Admission: EM | Admit: 2021-08-24 | Discharge: 2021-08-25 | Disposition: A | Discharge status: Home / Self Care | Payer: 59 | Attending: Emergency Medicine | Admitting: Emergency Medicine

## 2021-08-24 ENCOUNTER — Other Ambulatory Visit: Payer: Self-pay

## 2021-08-24 ENCOUNTER — Encounter (HOSPITAL_COMMUNITY): Payer: Self-pay | Admitting: Emergency Medicine

## 2021-08-24 DIAGNOSIS — Z20822 Contact with and (suspected) exposure to covid-19: Secondary | ICD-10-CM | POA: Insufficient documentation

## 2021-08-24 DIAGNOSIS — H66005 Acute suppurative otitis media without spontaneous rupture of ear drum, recurrent, left ear: Secondary | ICD-10-CM | POA: Insufficient documentation

## 2021-08-24 DIAGNOSIS — R519 Headache, unspecified: Secondary | ICD-10-CM | POA: Diagnosis present

## 2021-08-24 DIAGNOSIS — M7918 Myalgia, other site: Secondary | ICD-10-CM | POA: Insufficient documentation

## 2021-08-24 LAB — RESP PANEL BY RT-PCR (FLU A&B, COVID) ARPGX2
Influenza A by PCR: NEGATIVE
Influenza B by PCR: NEGATIVE
SARS Coronavirus 2 by RT PCR: NEGATIVE

## 2021-08-24 MED ORDER — AMOXICILLIN-POT CLAVULANATE 875-125 MG PO TABS
1.0000 | ORAL_TABLET | Freq: Once | ORAL | Status: AC
  Administered 2021-08-24: 1 via ORAL
  Filled 2021-08-24: qty 1

## 2021-08-24 MED ORDER — SODIUM CHLORIDE 0.9 % IV BOLUS
500.0000 mL | Freq: Once | INTRAVENOUS | Status: AC
  Administered 2021-08-24: 500 mL via INTRAVENOUS

## 2021-08-24 MED ORDER — ONDANSETRON 4 MG PO TBDP: 4.0000 mg | ORAL_TABLET | Freq: Three times a day (TID) | ORAL | 0 refills | Status: DC | PRN

## 2021-08-24 MED ORDER — METOCLOPRAMIDE HCL 5 MG/ML IJ SOLN
10.0000 mg | Freq: Once | INTRAMUSCULAR | Status: AC
  Administered 2021-08-24: 10 mg via INTRAVENOUS
  Filled 2021-08-24: qty 2

## 2021-08-24 MED ORDER — AMOXICILLIN-POT CLAVULANATE 875-125 MG PO TABS: 1.0000 | ORAL_TABLET | Freq: Two times a day (BID) | ORAL | 0 refills | Status: AC

## 2021-08-24 MED ORDER — KETOROLAC TROMETHAMINE 15 MG/ML IJ SOLN
15.0000 mg | Freq: Once | INTRAMUSCULAR | Status: AC
  Administered 2021-08-24: 15 mg via INTRAVENOUS
  Filled 2021-08-24: qty 1

## 2021-08-24 MED ORDER — DIPHENHYDRAMINE HCL 50 MG/ML IJ SOLN
25.0000 mg | Freq: Once | INTRAMUSCULAR | Status: AC
  Administered 2021-08-24: 25 mg via INTRAVENOUS
  Filled 2021-08-24: qty 1

## 2021-08-24 MED ORDER — ONDANSETRON HCL 4 MG/2ML IJ SOLN
4.0000 mg | Freq: Once | INTRAMUSCULAR | Status: AC
  Administered 2021-08-24: 4 mg via INTRAVENOUS
  Filled 2021-08-24: qty 2

## 2021-08-24 NOTE — ED Provider Notes (Signed)
?[facility]-EMERGENCY DEPT ?Provider Note ? ? ?CSN: [id number] ?Arrival date & time: 08/24/21  2015 ? ?  ? ?History ? ?No chief complaint on file. ? ? ?Cloyce Paterson is a 21 y.o. female. ? ?HPI ?Patient is a 20 year old female who presents to the emergency department due to left ear fullness as well as a headache.  She was initially seen on March 28 of this year and diagnosed with an acute non-intractable headache, upper respiratory infection, as well as nonrecurrent acute serous otitis media of the left ear.  She was discharged on a course of amoxicillin which she completed and states that her symptoms resolved.  Yesterday she began experiencing fullness in the left ear once again as well as headache and myalgias across the neck and back.  States that her symptoms are the same as her visit on March 28.  Denies any chest pain but does note some mild shortness of breath. ?  ? ?Home Medications ?Prior to Admission medications   ?Medication Sig Start Date End Date Taking? Authorizing Provider  ?amoxicillin-clavulanate (AUGMENTIN) 875-125 MG tablet Take 1 tablet by mouth every 12 (twelve) hours for 7 days. 08/24/21 08/31/21 Yes Placido Sou, PA-C  ?ondansetron (ZOFRAN-ODT) 4 MG disintegrating tablet Take 1 tablet (4 mg total) by mouth every 8 (eight) hours as needed for nausea or vomiting. 08/24/21  Yes Placido Sou, PA-C  ?FLUoxetine (PROZAC) 20 MG capsule Take 1 capsule (20 mg total) by mouth daily. 05/09/21   Massengill, Harrold Donath, MD  ?hydrOXYzine (ATARAX) 25 MG tablet Take 1 tablet (25 mg total) by mouth 3 (three) times daily as needed for anxiety. 05/08/21   Massengill, Harrold Donath, MD  ?levothyroxine (SYNTHROID) 75 MCG tablet Take 1 tablet (75 mcg total) by mouth daily. 05/09/21   Massengill, Harrold Donath, MD  ?lithium carbonate (ESKALITH) 450 MG CR tablet Take 1 tablet (450 mg total) by mouth 2 (two) times daily. 05/08/21   Massengill, Harrold Donath, MD  ?metFORMIN (GLUCOPHAGE) 500 MG tablet Take 1 tablet (500 mg  total) by mouth daily with breakfast. 05/09/21   Massengill, Harrold Donath, MD  ?midodrine (PROAMATINE) 2.5 MG tablet Take 2.5 mg by mouth 2 (two) times daily. 04/18/21   [provider]  ?OLANZapine (ZYPREXA) 5 MG tablet Take 1 tablet (5 mg total) by mouth at bedtime. 05/08/21   Massengill, Harrold Donath, MD  ?SIMPESSE 0.15-0.03 &0.01 MG tablet Take 1 tablet by mouth at bedtime. 05/02/21   [provider]  ?topiramate (TOPAMAX) 25 MG tablet Take 1 tablet (25 mg total) by mouth 2 (two) times daily. 05/08/21   Massengill, Harrold Donath, MD  ?traZODone (DESYREL) 50 MG tablet Take 1 tablet (50 mg total) by mouth at bedtime as needed for sleep. 05/08/21   Phineas Inches, MD  ?   ? ?Allergies    ?Ferrous sulfate   ? ?Review of Systems   ?Review of Systems  ?HENT:  Positive for ear pain and sore throat. Negative for ear discharge.   ?Respiratory:  Positive for cough and shortness of breath.   ?Cardiovascular:  Negative for chest pain.  ?Neurological:  Positive for headaches.  ? ?Physical Exam ?Updated Vital Signs ?BP 123/86 (BP Location: Left Arm)   Pulse 88   Temp 98 ?F (36.7 ?C) (Oral)   Resp 18   Ht 5\' 9"  (1.753 m)   Wt 105.5 kg   SpO2 99%   BMI 34.35 kg/m?  ?Physical Exam ?Vitals and nursing note reviewed.  ?Constitutional:   ?   General: She is not in  acute distress. ?   Appearance: Normal appearance. She is not ill-appearing, toxic-appearing or diaphoretic.  ?HENT:  ?   Head: Normocephalic and atraumatic.  ?   Right Ear: Tympanic membrane, ear canal and external ear normal. There is no impacted cerumen.  ?   Left Ear: Ear canal and external ear normal. There is no impacted cerumen.  ?   Ears:  ?   Comments: Right ear, EAC, and TM appears normal.  Left ear and EAC appears normal.  Left TM is erythematous and mildly bulging. ?   Nose: Nose normal.  ?   Mouth/Throat:  ?   Mouth: Mucous membranes are moist.  ?   Pharynx: Oropharynx is clear. No oropharyngeal exudate or posterior oropharyngeal erythema.  ?Eyes:  ?    General: No scleral icterus.    ?   Right eye: No discharge.     ?   Left eye: No discharge.  ?   Extraocular Movements: Extraocular movements intact.  ?   Conjunctiva/sclera: Conjunctivae normal.  ?   Pupils: Pupils are equal, round, and reactive to light.  ?Cardiovascular:  ?   Rate and Rhythm: Normal rate and regular rhythm.  ?   Pulses: Normal pulses.  ?   Heart sounds: Normal heart sounds. No murmur heard. ?  No friction rub. No gallop.  ?Pulmonary:  ?   Effort: Pulmonary effort is normal. No respiratory distress.  ?   Breath sounds: Normal breath sounds. No stridor. No wheezing, rhonchi or rales.  ?Abdominal:  ?   General: Abdomen is flat. There is no distension.  ?Musculoskeletal:     ?   General: Normal range of motion.  ?   Cervical back: Normal range of motion and neck supple. No tenderness.  ?Skin: ?   General: Skin is warm and dry.  ?Neurological:  ?   General: No focal deficit present.  ?   Mental Status: She is alert and oriented to person, place, and time.  ?   Comments: Speaking clearly, coherently, and in complete sentences.  Moving all 4 extremities with ease.  A&O x3.  No gross deficits.  ?Psychiatric:     ?   Mood and Affect: Mood normal.     ?   Behavior: Behavior normal.  ? ? ?ED Results / Procedures / Treatments   ?Labs ?(all labs ordered are listed, but only abnormal results are displayed) ?Labs Reviewed  ?RESP PANEL BY RT-PCR (FLU A&B, COVID) ARPGX2  ? ?EKG ?None ? ?Radiology ?No results found. ? ?Procedures ?Procedures  ? ?Medications Ordered in ED ?Medications  ?ondansetron (ZOFRAN) injection 4 mg (has no administration in time range)  ?sodium chloride 0.9 % bolus 500 mL (500 mLs Intravenous New Bag/Given 08/24/21 2149)  ?metoCLOPramide (REGLAN) injection 10 mg (10 mg Intravenous Given 08/24/21 2154)  ?diphenhydrAMINE (BENADRYL) injection 25 mg (25 mg Intravenous Given 08/24/21 2151)  ?ketorolac (TORADOL) 15 MG/ML injection 15 mg (15 mg Intravenous Given 08/24/21 2153)   ?amoxicillin-clavulanate (AUGMENTIN) 875-125 MG per tablet 1 tablet (1 tablet Oral Given 08/24/21 2155)  ? ?ED Course/ Medical Decision Making/ A&P ?  ?                        ?Medical Decision Making ?Risk ?Prescription drug management. ? ?Pt is a 21 y.o. female who presents to the emergency department due to left ear pain as well as a headache.  Patient was seen in late March this year with similar complaints.  States that she took antibiotics and her symptoms resolved but then returned once again. ? ?Labs: ?Respiratory panel is negative. ? ?I, Placido SouLogan Dorreen Valiente, PA-C, personally reviewed and evaluated these images and lab results as part of my medical decision-making. ? ?On my exam heart is regular rate and rhythm without murmurs, rubs, or gallops.  Lungs are clear to auscultation bilaterally.  A&O x3.  Moving all 4 extremities with ease.  No gross deficits.  Patient afebrile, nontachycardic, and nontoxic-appearing.  Her left TM is erythematous and bulging.  Consistent with otitis media.  Given that she was recently on amoxicillin will discharge on a course of Augmentin.  First dose given in the emergency department.  Patient given a migraine cocktail for headache and notes moderate improvement.  Still notes some mild nausea so an additional dose of Zofran was given. ? ?Patient appears stable for discharge at this time and she is agreeable.  Will discharge on a course of Zofran as well as Augmentin.  Discussed return precautions.  Her questions were answered and she was amicable at the time of discharge. ? ?Note: Portions of this report may have been transcribed using voice recognition software. Every effort was made to ensure accuracy; however, inadvertent computerized transcription errors may be present.  ? ?Final Clinical Impression(s) / ED Diagnoses ?Final diagnoses:  ?Recurrent acute suppurative otitis media without spontaneous rupture of left tympanic membrane  ?Bad headache  ? ?Rx / DC Orders ?ED Discharge  Orders   ? ?      Ordered  ?  ondansetron (ZOFRAN-ODT) 4 MG disintegrating tablet  Every 8 hours PRN       ? 08/24/21 2251  ?  amoxicillin-clavulanate (AUGMENTIN) 875-125 MG tablet  Every 12 hours       ? 08/24/21 2251  ? ?  ?  ? ?  ? ?

## 2021-08-24 NOTE — ED Triage Notes (Addendum)
Patient is complaining of left ear pain, head, neck, and back pain. Patient was treated for all this on 3/28. Patient was treated and felt better. Now it is starting again. ?

## 2021-08-24 NOTE — Discharge Instructions (Addendum)
I am prescribing you an antibiotic called Augmentin.  Please take this twice per day for the next 7 days.  Do not stop taking this early even if you feel your symptoms have resolved. ? ?I am prescribing you a medication called Zofran.  This is a disintegrating tablet you can use up to 3 times a day for management of your nausea and vomiting.  Please only take this as prescribed.  Please only take this if you are experiencing nausea and vomiting that you cannot control. ? ?Please continue to monitor your symptoms closely.  If you develop any new or worsening symptoms please come back to the emergency department. ?

## 2021-09-03 ENCOUNTER — Other Ambulatory Visit: Payer: Self-pay

## 2021-09-03 ENCOUNTER — Encounter (HOSPITAL_COMMUNITY): Payer: Self-pay | Admitting: Emergency Medicine

## 2021-09-03 ENCOUNTER — Emergency Department (HOSPITAL_COMMUNITY)
Admission: EM | Admit: 2021-09-03 | Discharge: 2021-09-04 | Disposition: A | Discharge status: Home / Self Care | Payer: 59 | Attending: Emergency Medicine | Admitting: Emergency Medicine

## 2021-09-03 DIAGNOSIS — E039 Hypothyroidism, unspecified: Secondary | ICD-10-CM | POA: Diagnosis not present

## 2021-09-03 DIAGNOSIS — M2669 Other specified disorders of temporomandibular joint: Secondary | ICD-10-CM | POA: Insufficient documentation

## 2021-09-03 DIAGNOSIS — H9202 Otalgia, left ear: Secondary | ICD-10-CM | POA: Diagnosis present

## 2021-09-03 DIAGNOSIS — Z79899 Other long term (current) drug therapy: Secondary | ICD-10-CM | POA: Diagnosis not present

## 2021-09-03 DIAGNOSIS — R0981 Nasal congestion: Secondary | ICD-10-CM | POA: Insufficient documentation

## 2021-09-03 NOTE — ED Triage Notes (Signed)
Pt reports pain in her left ear, neck, and back. Pt reports an ear infection that she has been seen for multiple times in the past month. Pt states that the pain radiates from her ear to the rest of her head. ?

## 2021-09-04 MED ORDER — KETOROLAC TROMETHAMINE 30 MG/ML IJ SOLN
30.0000 mg | Freq: Once | INTRAMUSCULAR | Status: AC
  Administered 2021-09-04: 30 mg via INTRAMUSCULAR
  Filled 2021-09-04: qty 1

## 2021-09-04 MED ORDER — CYCLOBENZAPRINE HCL 10 MG PO TABS
10.0000 mg | ORAL_TABLET | Freq: Once | ORAL | Status: AC
  Administered 2021-09-04: 10 mg via ORAL
  Filled 2021-09-04: qty 1

## 2021-09-04 MED ORDER — CYCLOBENZAPRINE HCL 10 MG PO TABS: 10.0000 mg | ORAL_TABLET | Freq: Every day | ORAL | 0 refills | Status: DC

## 2021-09-04 MED ORDER — METAXALONE 800 MG PO TABS: 800.0000 mg | ORAL_TABLET | Freq: Three times a day (TID) | ORAL | 0 refills | Status: DC

## 2021-09-04 NOTE — ED Provider Notes (Signed)
?[facility]-EMERGENCY DEPT ?Provider Note ? ? ?CSN: [id number] ?Arrival date & time: 09/03/21  2339 ? ?  ? ?History ? ?Chief Complaint  ?Patient presents with  ? Ear Pain  ? ? ?Sabrina Bennett is a 21 y.o. female. ? ?HPI ? ?Patient with medical history including bipolar, anxiety, pots, presents with complaints of left-sided ear pain.  Patient states that she is having ear pain for the last month, she states she initially had ear pain in the beginning of April, she was placed on antibiotics but the pain remain, she came back to emergency department about 1 week ago was placed on a different antibiotic and is still having pain.  She denies any drainage or discharge from her ear no recent trauma to her ear and states she has some decreased hearing, she admits to some lightheaded dizziness states is slightly worse than her usual, no change in vision paresthesia or weakness of her lower extremities no nausea or vomiting.  She denies any systemic infection like fevers chills chest pain shortness of breath general body aches.  Has never seen an ENT doctor in the past.  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 ? ?Reviewed patient's chart been seen twice in the last month for left-sided ear pain, initially started on amoxicillin, then switched to Augmentin. ? ?Home Medications ?Prior to Admission medications   ?Medication Sig Start Date End Date Taking? Authorizing Provider  ?cyclobenzaprine (FLEXERIL) 10 MG tablet Take 1 tablet (10 mg total) by mouth at bedtime for 14 days. 09/04/21 09/18/21 Yes Carroll Sage, PA-C  ?metaxalone (METAXALL) 800 MG tablet Take 1 tablet (800 mg total) by mouth 3 (three) times daily for 14 days. 09/04/21 09/18/21 Yes Carroll Sage, PA-C  ?FLUoxetine (PROZAC) 20 MG capsule Take 1 capsule (20 mg total) by mouth daily. 05/09/21   Massengill, Harrold Donath, MD  ?hydrOXYzine (ATARAX) 25 MG tablet Take 1 tablet (25 mg total) by mouth 3 (three) times daily as needed for anxiety. 05/08/21    Massengill, Harrold Donath, MD  ?levothyroxine (SYNTHROID) 75 MCG tablet Take 1 tablet (75 mcg total) by mouth daily. 05/09/21   Massengill, Harrold Donath, MD  ?lithium carbonate (ESKALITH) 450 MG CR tablet Take 1 tablet (450 mg total) by mouth 2 (two) times daily. 05/08/21   Massengill, Harrold Donath, MD  ?metFORMIN (GLUCOPHAGE) 500 MG tablet Take 1 tablet (500 mg total) by mouth daily with breakfast. 05/09/21   Massengill, Harrold Donath, MD  ?midodrine (PROAMATINE) 2.5 MG tablet Take 2.5 mg by mouth 2 (two) times daily. 04/18/21   [provider]  ?OLANZapine (ZYPREXA) 5 MG tablet Take 1 tablet (5 mg total) by mouth at bedtime. 05/08/21   Massengill, Harrold Donath, MD  ?ondansetron (ZOFRAN-ODT) 4 MG disintegrating tablet Take 1 tablet (4 mg total) by mouth every 8 (eight) hours as needed for nausea or vomiting. 08/24/21   Placido Sou, PA-C  ?SIMPESSE 0.15-0.03 &0.01 MG tablet Take 1 tablet by mouth at bedtime. 05/02/21   [provider]  ?topiramate (TOPAMAX) 25 MG tablet Take 1 tablet (25 mg total) by mouth 2 (two) times daily. 05/08/21   Massengill, Harrold Donath, MD  ?traZODone (DESYREL) 50 MG tablet Take 1 tablet (50 mg total) by mouth at bedtime as needed for sleep. 05/08/21   Phineas Inches, MD  ?   ? ?Allergies    ?Ferrous sulfate   ? ?Review of Systems   ?Review of Systems  ?Constitutional:  Negative for chills and fever.  ?HENT:  Positive for ear pain and hearing loss.   ?  Respiratory:  Negative for shortness of breath.   ?Cardiovascular:  Negative for chest pain.  ?Gastrointestinal:  Negative for abdominal pain.  ?Neurological:  Negative for headaches.  ? ?Physical Exam ?Updated Vital Signs ?BP 131/81   Pulse 90   Temp 98.9 ?F (37.2 ?C) (Oral)   Resp 16   Ht 5\' 9"  (1.753 m)   Wt 104.3 kg   SpO2 97%   BMI 33.97 kg/m?  ?Physical Exam ?Vitals and nursing note reviewed.  ?Constitutional:   ?   General: She is not in acute distress. ?   Appearance: She is not ill-appearing.  ?HENT:  ?   Head: Normocephalic and atraumatic.  ?    Right Ear: Tympanic membrane, ear canal and external ear normal.  ?   Left Ear: Tympanic membrane, ear canal and external ear normal.  ?   Ears:  ?   Comments: No ear protrusion, no tenderness behind the mastoids, no drainage or discharge from either ear, ear canals are both clear bilaterally, TMs are both unremarkable for signs of infection or effusions. ?   Nose: Congestion present.  ?   Mouth/Throat:  ?   Comments: No trismus no torticollis no oral trauma present, patient has crepitus and popping at the left TMJ. ?Eyes:  ?   Conjunctiva/sclera: Conjunctivae normal.  ?Cardiovascular:  ?   Rate and Rhythm: Normal rate and regular rhythm.  ?   Pulses: Normal pulses.  ?   Heart sounds: No murmur heard. ?  No friction rub. No gallop.  ?Pulmonary:  ?   Effort: No respiratory distress.  ?   Breath sounds: No wheezing, rhonchi or rales.  ?Skin: ?   General: Skin is warm and dry.  ?Neurological:  ?   Mental Status: She is alert.  ?Psychiatric:     ?   Mood and Affect: Mood normal.  ? ? ?ED Results / Procedures / Treatments   ?Labs ?(all labs ordered are listed, but only abnormal results are displayed) ?Labs Reviewed - No data to display ? ?EKG ?None ? ?Radiology ?No results found. ? ?Procedures ?Procedures  ? ? ?Medications Ordered in ED ?Medications  ?ketorolac (TORADOL) 30 MG/ML injection 30 mg (has no administration in time range)  ?cyclobenzaprine (FLEXERIL) tablet 10 mg (has no administration in time range)  ? ? ?ED Course/ Medical Decision Making/ A&P ?  ?                        ?Medical Decision Making ?Risk ?Prescription drug management. ? ? ?This patient presents to the ED for concern of left ear pain, this involves an extensive number of treatment options, and is a complaint that carries with it a high risk of complications and morbidity.  The differential diagnosis includes otitis externa, otitis media, mastoiditis, ? ? ? ?Additional history obtained: ? ?Additional history obtained from partner who is at  bedside ?External records from outside source obtained and reviewed including previous ER notes urgent care notes imaging ? ? ?Co morbidities that complicate the patient evaluation ? ?N/A ? ?Social Determinants of Health: ? ?Patient does have an ENT doctor or dentist-we will refer patient to both upon discharge ? ? ? ?Lab Tests: ? ?I Ordered, and personally interpreted labs.  The pertinent results include: N/A ? ? ?Imaging Studies ordered: ? ?I ordered imaging studies including N/A ?I independently visualized and interpreted imaging which showed N/A ?I agree with the radiologist interpretation ? ? ?Cardiac Monitoring: ? ?The patient  was maintained on a cardiac monitor.  I personally viewed and interpreted the cardiac monitored which showed an underlying rhythm of: N/A ? ? ?Medicines ordered and prescription drug management: ? ?I ordered medication including Flexeril and Toradol for pain ?I have reviewed the patients home medicines and have made adjustments as needed ? ?Critical Interventions: ? ?N/A ? ? ?Reevaluation: ? ?Presents with left ear pain, no evidence of infection noted within the ear, she does have left TMJ with nasal congestion we will provide her with pain medications, as well as recommend decongestions for allergies.  Referred to ENT and dentist patient is agreement this plan ? ?Consultations Obtained: ? ?N/A ? ? ? ?Test Considered: ? ?CT maxillofacial will defer as my suspicion for mastoiditis very low at this time no ear protrusion no overlying skin changes nontender my exam no evidence of internal ear infection present. ? ? ? ?Rule out ?I have low suspicion for otitis externa or media no evidence of infection present my exam.  Suspicion for TM perforation as TMs are fully intact.   ? ? ? ?Dispostion and problem list ? ?After consideration of the diagnostic results and the patients response to treatment, I feel that the patent would benefit from discharge.  ? ?Left ear pain-I suspect this is likely  multifactorial, TMJ as well as seasonal allergies, will provide her with pain medication for TMJ, recommend decongestions, follow-up with a dentist as well as ENT doctor, will also recommend that she uses a mouthguard as she

## 2021-09-04 NOTE — Discharge Instructions (Signed)
Left ear pain-likely this is TMJ as well as allergies, I recommend Claritin and Flonase daily, I have given you to muscle laxer's which I want you to take daily, the metaxall please take during the day, Flexeril at nighttime and take ibuprofen daily as this will help with your pain.  I also recommend using a mouthguard to help with your pain.  Please follow-up with a dentist as well as ENT for further evaluation. ?Come back to the emergency department if you develop chest pain, shortness of breath, severe abdominal pain, uncontrolled nausea, vomiting, diarrhea. ? ?

## 2021-09-09 ENCOUNTER — Ambulatory Visit (HOSPITAL_COMMUNITY)
Admission: EM | Admit: 2021-09-09 | Discharge: 2021-09-09 | Disposition: A | Discharge status: Home / Self Care | Payer: 59 | Attending: Psychiatry | Admitting: Psychiatry

## 2021-09-09 DIAGNOSIS — F339 Major depressive disorder, recurrent, unspecified: Secondary | ICD-10-CM | POA: Diagnosis not present

## 2021-09-09 DIAGNOSIS — R45851 Suicidal ideations: Secondary | ICD-10-CM | POA: Diagnosis not present

## 2021-09-09 MED ORDER — HYDROXYZINE HCL 25 MG PO TABS: 25.0000 mg | ORAL_TABLET | Freq: Three times a day (TID) | ORAL | Status: DC | PRN

## 2021-09-09 MED ORDER — LITHIUM CARBONATE ER 450 MG PO TBCR: 450.0000 mg | EXTENDED_RELEASE_TABLET | Freq: Two times a day (BID) | ORAL | Status: DC

## 2021-09-09 MED ORDER — ALUM & MAG HYDROXIDE-SIMETH 200-200-20 MG/5ML PO SUSP: 30.0000 mL | ORAL | Status: DC | PRN

## 2021-09-09 MED ORDER — FLUOXETINE HCL 20 MG PO CAPS: 20.0000 mg | ORAL_CAPSULE | Freq: Every day | ORAL | Status: DC

## 2021-09-09 MED ORDER — HYDROXYZINE HCL 25 MG PO TABS: 50.0000 mg | ORAL_TABLET | Freq: Three times a day (TID) | ORAL | Status: DC | PRN

## 2021-09-09 MED ORDER — MAGNESIUM HYDROXIDE 400 MG/5ML PO SUSP: 30.0000 mL | Freq: Every day | ORAL | Status: DC | PRN

## 2021-09-09 MED ORDER — LEVONORGEST-ETH ESTRAD 91-DAY 0.15-0.03 &0.01 MG PO TABS: 1.0000 | ORAL_TABLET | Freq: Every day | ORAL | Status: DC

## 2021-09-09 MED ORDER — ACETAMINOPHEN 325 MG PO TABS: 650.0000 mg | ORAL_TABLET | Freq: Four times a day (QID) | ORAL | Status: DC | PRN

## 2021-09-09 MED ORDER — LEVOTHYROXINE SODIUM 75 MCG PO TABS: 75.0000 ug | ORAL_TABLET | Freq: Every day | ORAL | Status: DC

## 2021-09-09 MED ORDER — OLANZAPINE 5 MG PO TABS: 5.0000 mg | ORAL_TABLET | Freq: Every day | ORAL | Status: DC

## 2021-09-09 MED ORDER — MIDODRINE HCL 2.5 MG PO TABS
2.5000 mg | ORAL_TABLET | Freq: Two times a day (BID) | ORAL | Status: DC
  Filled 2021-09-09 (×2): qty 1

## 2021-09-09 MED ORDER — VILOXAZINE HCL ER 200 MG PO CP24
200.0000 mg | ORAL_CAPSULE | Freq: Every day | ORAL | Status: DC
  Filled 2021-09-09 (×2): qty 1

## 2021-09-09 MED ORDER — METFORMIN HCL 500 MG PO TABS: 500.0000 mg | ORAL_TABLET | Freq: Every day | ORAL | Status: DC

## 2021-09-09 MED ORDER — TRAZODONE HCL 50 MG PO TABS: 50.0000 mg | ORAL_TABLET | Freq: Every evening | ORAL | Status: DC | PRN

## 2021-09-09 NOTE — ED Notes (Signed)
Patient discharge with an after visit summary with follow up resources. Patient completed a safety plan with significant other and NP. Patient discharged to home, and all belongings from [facility] given to patient.  ?

## 2021-09-09 NOTE — Discharge Instructions (Addendum)
Follow up with your psychiatrist at [facility] for medication management. Follow up with counseling at [facility]. ? ?Patient is instructed prior to discharge to: ?Take all medications as prescribed by his/her mental healthcare provider. ?Report any adverse effects and or reactions from the medicines to his/her outpatient provider promptly. ?Keep all scheduled appointments, to ensure that you are getting refills on time and to avoid any interruption in your medication.  If you are unable to keep an appointment call to reschedule.  Be sure to follow-up with resources and follow-up appointments provided.  ?Patient has been instructed & cautioned: To not engage in alcohol and or illegal drug use while on prescription medicines. ?In the event of worsening symptoms, patient is instructed to call the crisis hotline, 911 and or go to the nearest ED for appropriate evaluation and treatment of symptoms. ?To follow-up with his/her primary care provider for your other medical issues, concerns and or health care needs. ?

## 2021-09-09 NOTE — BH Assessment (Addendum)
Comprehensive Clinical Assessment (CCA) Note ? ?09/09/2021 ?Sabrina Bennett ?[id number] ? ?DISPOSITION: Truman Hayward NP recommends patient be discharged with OP resources.   ? ?[facility] ED from 09/09/2021 in [facility] ED from 09/03/2021 in [facility] DE[facility] ED from 08/24/2021 in [facility] DEPT  ?C-SSRS RISK CATEGORY High Risk No Risk No Risk  ? ?  ? The patient demonstrates the following risk factors for suicide: Chronic risk factors for suicide include: psychiatric disorder of depression . Acute risk factors for suicide include: social withdrawal/isolation. Protective factors for this patient include: coping skills. Considering these factors, the overall suicide risk at this point appears to be high. Patient is not appropriate for outpatient follow up.  ? ?Sabrina Bennett is a 21 year old female who presents voluntary to [facility] as a walk in this date with ongoing S/I although denies any active intent or plan. Patient denies any H/I or AVH. Patient reports ongoing S/I "for years" and states symptoms have worsened over the last three months associated with her depression. Patient reports they often feel overwhelmed "for no reason" and often feel they may self harm although have never acted on those thoughts. Patient states she currently resides with her partner (boyfriend) of two years who is her primary support. Patient per chart review has a history significant for Bipolar Depression. Patient reports, having a history of cutting, she last cut herself was 9 to 10  months ago on her legs and thighs with an exacto knife although denies any current self harm. Patient denies any SA issues. Patient denies current access to weapons.  ? ?Patient is linked to Dr. Lynnea Maizes, psychiatrist at [facility] in [place], [place]. Patient reports she has an appoint to meet with that provider this coming Monday 5/8. Patient denies currently having a  therapist. Per chart review patient was seen on 05/03/21 when she presented to [facility] with similar symptoms and meet inpatient criteria at that time.   ? ?Patient is alert and oriented x 5. Patient speaks in normal tone and volume. Patient's memory appears to be intact with thoughts organized. Patient's mood is anxious/depressed with affect congruent.    ? ? ? ?Chief Complaint: No chief complaint on file. ? ?Visit Diagnosis: Bipolar Depression   ? ? ?CCA Screening, Triage and Referral (STR) ? ?Patient Reported Information ?How did you hear about [place]? Self ? ?What Is the Reason for Your Visit/Call Today? Pt presents with passive S/I Pt denies any active plan or intent ? ?How Long Has This Been Causing You Problems? 1 wk - 1 month ? ?What Do You Feel Would Help You the Most Today? Treatment for Depression or other mood problem ? ? ?Have You Recently Had Any Thoughts About Hurting Yourself? Yes ? ?Are You Planning to Commit Suicide/Harm Yourself At This time? No ? ? ?Have you Recently Had Thoughts About [place]? No ? ?Are You Planning to Harm Someone at This Time? No ? ?Explanation: No data recorded ? ?Have You Used Any Alcohol or Drugs in the Past 24 Hours? No ? ?How Long Ago Did You Use Drugs or Alcohol? No data recorded ?What Did You Use and How Much? No data recorded ? ?Do You Currently Have a Therapist/Psychiatrist? Yes ? ?Name of Therapist/Psychiatrist: Pt states she has an online provider who assists with med mang although cannot recall the provider's name ? ? ?Have You Been Recently Discharged From Any Office Practice or Programs? No ? ?Explanation of  Discharge From Practice/Program: No data recorded ? ?  ?CCA Screening Triage Referral Assessment ?Type of Contact: Face-to-Face ? ?Telemedicine Service Delivery:   ?Is this Initial or Reassessment? No data recorded ?Date Telepsych consult ordered in CH[facility]:  No data recorded ?Time Telepsych consult ordered in CHL:  No data recorded ?Location of Assessment:  [facility] ? ?Provider Location: [facility] Assessment Services ? ? ?Collateral Involvement: None at this time ? ? ?Does Patient Have a Stage manager Guardian? No data recorded ?Name and Contact of Legal Guardian: No data recorded ?If Minor and Not Living with Parent(s), Who has Custody? NA ? ?Is CPS involved or ever been involved? Never ? ?Is APS involved or ever been involved? Never ? ? ?Patient Determined To Be At Risk for Harm To Self or Others Based on Review of Patient Reported Information or Presenting Complaint? Yes, for Self-Harm ? ?Method: No data recorded ?Availability of Means: No data recorded ?Intent: No data recorded ?Notification Required: No data recorded ?Additional Information for Danger to Others Potential: No data recorded ?Additional Comments for Danger to Others Potential: No data recorded ?Are There Guns or Other Weapons in [place]? No data recorded ?Types of Guns/Weapons: No data recorded ?Are These Weapons Safely Secured?                            No data recorded ?Who Could Verify You Are Able To Have These Secured: No data recorded ?Do You Have any Outstanding Charges, Pending Court Dates, Parole/Probation? No data recorded ?Contacted To Inform of Risk of Harm To Self or Others: No data recorded ? ? ?Does Patient Present under Involuntary Commitment? No ? ?IVC Papers Initial File Date: No data recorded ? ?[place] of Residence: Kathleen Argue ? ? ?Patient Currently Receiving the Following Services: Medication Management ? ? ?Determination of Need: Urgent (48 hours) ? ? ?Options For Referral: -- (Observation) ? ? ? ? ?CCA Biopsychosocial ?Patient Reported Schizophrenia/Schizoaffective Diagnosis in Past: No ? ? ?Strengths: Pt is willing to participate in treatment ? ? ?Mental Health Symptoms ?Depression:   ?Change in energy/activity; Fatigue; Hopelessness ?  ?Duration of Depressive symptoms:  ?Duration of Depressive Symptoms: Greater than two weeks ?  ?Mania:   ?None ?   ?Anxiety:    ?Difficulty concentrating; Restlessness; Irritability ?  ?Psychosis:   ?None ?  ?Duration of Psychotic symptoms:    ?Trauma:   ?None ?  ?Obsessions:   ?None ?  ?Compulsions:   ?None ?  ?Inattention:   ?None ?  ?Hyperactivity/Impulsivity:   ?N/A ?  ?Oppositional/Defiant Behaviors:   ?None ?  ?Emotional Irregularity:   ?Chronic feelings of emptiness; Mood lability ?  ?Other Mood/Personality Symptoms:   ?NA ?  ? ?Mental Status Exam ?Appearance and self-care  ?Stature:   ?Average ?  ?Weight:   ?Average weight ?  ?Clothing:   ?Neat/clean ?  ?Grooming:   ?Normal ?  ?Cosmetic use:   ?Age appropriate ?  ?Posture/gait:   ?Normal ?  ?Motor activity:   ?Not Remarkable ?  ?Sensorium  ?Attention:   ?Normal ?  ?Concentration:   ?Anxiety interferes ?  ?Orientation:   ?X5 ?  ?Recall/memory:   ?Normal ?  ?Affect and Mood  ?Affect:   ?Anxious ?  ?Mood:   ?Anxious; Depressed ?  ?Relating  ?Eye contact:   ?Normal ?  ?Facial expression:   ?Anxious ?  ?Attitude toward examiner:   ?Cooperative ?  ?Thought and Language  ?  Speech flow:  ?Clear and Coherent ?  ?Thought content:   ?Appropriate to Mood and Circumstances ?  ?Preoccupation:   ?None ?  ?Hallucinations:   ?None ?  ?Organization:  No data recorded  ?Executive Functions  ?Fund of Knowledge:   ?[place] ?  ?Intelligence:   ?Average ?  ?Abstraction:   ?Normal ?  ?Judgement:   ?Fair ?  ?Reality Testing:   ?Realistic ?  ?Insight:   ?Fair ?  ?Decision Making:   ?Normal ?  ?Social Functioning  ?Social Maturity:   ?Responsible ?  ?Social Judgement:   ?Normal ?  ?Stress  ?Stressors:   ?Family conflict ?  ?Coping Ability:   ?Overwhelmed ?  ?Skill Deficits:   ?Decision making ?  ?Supports:   ?Usual ?  ? ? ?Religion: ?Religion/Spirituality ?Are You A Religious Person?:  (Pt states she is spiritual) ? ?Leisure/Recreation: ?Leisure / Recreation ?Do You Have Hobbies?: No ? ?Exercise/Diet: ?Exercise/Diet ?Do You Exercise?: No ?Have You Gained or Lost A Significant Amount of Weight in the  Past Six Months?: No ?Do You Follow a Special Diet?: No ?Do You Have Any Trouble Sleeping?: No ? ? ?CCA Employment/Education ?Employment/Work Situation: ?Employment / Work Situation ?Employment Situation: E

## 2021-09-09 NOTE — ED Provider Notes (Signed)
Behavioral Health Urgent Care Medical Screening Exam ? ?Patient Name: Sabrina Bennett ?MRN: [id number] ?Date of Evaluation: 09/09/21 ?Chief Complaint:   ?Diagnosis:  ?Final diagnoses:  ?Suicidal ideation  ?Episode of recurrent major depressive disorder, unspecified depression episode severity (HCC)  ? ?History of Present illness:  ? ?Pt presents voluntarily to [facility] behavioral health for walk-in assessment.  Pt is accompanied by [name], her boyfriend, who remains w/ pt throughout the assessment, as per pt request. Pt is assessed face-to-face by nurse practitioner.  ? ?Pt is a 21 y/o female w/ reported hx of Bipolar 1 disorder, GAD, POTS, Hypothyroidism, PTSD, OCD, panic disorder, depression, ADHD, ARFID, binge eating disorder. Pt recently at [facility] from 05/03/21-05/08/21, after presenting w/ worsening depression and SI w/ plan to shoot herself.  ? ?Pt reports current depressed, anxious mood. Reports chronic depression, anxiety that has been occurring for years, although states it has been worsening for the past month, is unable to identify triggering event. Reports chronic issues w/ appetite and sleep. Reports appetite has been "too much or too little", endorses weight gain of 30 lbs since December 2022. Reports sleep is "too much or not enough".  ? ?Reports experiencing passive (thoughts I wish I could go to sleep and not wake up) and active (want to kill myself) SI. Denies plan or intent to act on plan. Denies hx of SA. Reports she has been seeing "images". When asked further about this, states, she sees images of her stabbing herself in the stomach or slitting her throat w/ a knife. States 2 days ago, she found herself looking around for a sharp object, although stopped herself. Pt denies current passive/active SI. Pt states she is unable to contract to safety.  ? ?Pt reports hx of NSSI, cutting self w/ exacto knife, last occurring over a year ago. ? ?Pt reports she experiences AVH. States she sees "shadowy  figures", last occurring couple of days ago. Hears her voice being called, last occurred last week.  ? ?Reports feeling paranoia that people are coming after her, trying to kidnap her.  ? ?Pt has had 1 inpatient psychiatric hospitalization, which was at [facility] from 05/03/21-05/08/21. ? ?Pt is connected w/ medication management at [facility]. Pt states she is not currently connected w/ counseling, was seeing a therapist at [facility], last session in November 2022, although stopped due to scheduling issues.  ? ?Pt denies alcohol, marijuana, other SU. ? ?Pt is currently living w/ her boyfriend and a dog. ? ?Pt denies access to a firearm. ? ?Discussed recommendation for admission to continuous observation w/ reassessment. Pt and [name] agree w/ plan.  ? ?Nurse practitioner notified that pt changed her mind, would like to be discharged. Nurse practitioner spoke w/ pt and [name]. Pt contracts for safety. Extensive safety planning was completed w/ pt and Trent, including strict return precautions. Discussed methods to reduce the risk of self-injury or suicide attempts: Frequent conversations regarding unsafe thoughts. Locking/monitoring the use of all significant sharps. If there is a firearm in the home, keeping the firearm unloaded, locking the firearm, locking the ammunition separately from the firearm, preventing access to the firearm and the ammunition. Pt and [name] state there is no firearm in the home. Locking/monitoring the use medications, including over-the-counter medications and supplements. Having a responsible person dispense medications until patient has strengthened coping skills. Secure all chemical substances that can be ingested or inhaled. Calling 911/EMS or going to the nearest emergency room for any worsening of condition. Pt and [name] verbalize  understanding. Pt maintains she can maintain safety on discharge. Trent verbalizes he can maintain safety for pt upon discharge.  ? ?Pt reports  warning signs of depression, anxiety are "breaking down", less interested in things, worrying about bothering people. States coping skills are reminding herself she is safe, deserves to live. Reports support people are [place], her mother, and her best friend. States she wants to live because she has many upcoming events she is looking forward to. States she is looking forward to starting cheer again, to going on trip w/ [place] in July, and is almost done w/ school. Pt states [facility]. Pt's next medication management appointment w/ [facility] is on 09/11/21. ? ?Pt is a&ox3. Presents as casually dressed, well groomed, appropriate for environment. Eye contact is good. Speech is clear and coherent, w/ nml rate and volume. Reported mood is depressed and anxious. Affect is full range. TP is coherent, goal directed, linear. Description of associations is intact. TC is logical. There is no evidence of internal preoccupation, agitation, aggression, or distractibility. No delusions or paranoia elicited. Pt is calm, cooperative, and pleasant. ? ?Psychiatric Specialty Exam ? ?Presentation  ?General Appearance:Appropriate for Environment; Casual; Well Groomed ? ?Eye Contact:Good ? ?Speech:Clear and Coherent; Normal Rate ? ?Speech Volume:Normal ? ?Handedness:No data recorded ? ?Mood and Affect  ?Mood:Depressed; Anxious ? ?Affect:Full Range ? ?Thought Process  ?Thought Processes:Coherent; Goal Directed; Linear ? ?Descriptions of Associations:Intact ? ?Orientation:Full (Time, Place and Person) ? ?Thought Content:Logical ? Diagnosis of Schizophrenia or Schizoaffective disorder in past: No ?  Hallucinations:None ? ?Ideas of Reference:None ? ?Suicidal Thoughts:No ? ?Homicidal Thoughts:No ? ?Sensorium  ?Memory:Immediate Good; Recent Good; Remote Good ? ?Judgment:Intact ? ?Insight:Fair ? ?Executive Functions  ?Concentration:Good ? ?Attention Span:Good ? ?Recall:Good ? ?Fund of  Knowledge:Good ? ?Language:Good ? ?Psychomotor Activity  ?Psychomotor Activity:Normal ? ?Assets  ?Assets:Communication Skills; Desire for Improvement; Housing; Social Support ? ?Sleep  ?Sleep:Poor ? ?Number of hours: 0 (Not documented) ? ?No data recorded ? ?Physical Exam: ?Physical Exam ?Eyes:  ?   Extraocular Movements: Extraocular movements intact.  ?Cardiovascular:  ?   Rate and Rhythm: Normal rate.  ?Pulmonary:  ?   Effort: Pulmonary effort is normal.  ?Neurological:  ?   Mental Status: She is alert and oriented to person, place, and time.  ?Psychiatric:     ?   Attention and Perception: Attention and perception normal.     ?   Mood and Affect: Mood is anxious and depressed.     ?   Speech: Speech normal.     ?   Behavior: Behavior normal. Behavior is cooperative.     ?   Thought Content: Thought content normal.     ?   Cognition and Memory: Cognition and memory normal.  ? ?Review of Systems  ?Constitutional: Negative.   ?Respiratory: Negative.    ?Cardiovascular: Negative.   ?Gastrointestinal: Negative.   ?Psychiatric/Behavioral:  Positive for depression. The patient is nervous/anxious.   ?Blood pressure 120/88, pulse 100, temperature 97.8 ?F (36.6 ?C), temperature source Oral, resp. rate 18, SpO2 100 %. There is no height or weight on file to calculate BMI. ? ?Musculoskeletal: ?Strength & Muscle Tone: within normal limits ?Gait & Station: normal ?Patient leans: N/A ? ?[facility] Disposition for Follow up and Recommendations: ?Based on my evaluation the patient does not appear to have an emergency medical condition and can be discharged with resources and follow up care in outpatient services for Medication Management and Individual  Therapy ? ?Lauree Chandler, NP ?09/09/2021, 5:46 PM ?

## 2021-09-10 ENCOUNTER — Other Ambulatory Visit: Payer: Self-pay | Admitting: Psychiatry

## 2021-09-10 ENCOUNTER — Inpatient Hospital Stay (HOSPITAL_COMMUNITY)
Admission: RE | Admit: 2021-09-10 | Discharge: 2021-09-18 | DRG: 885 | Disposition: A | Discharge status: Home / Self Care | Payer: 59 | Attending: Psychiatry | Admitting: Psychiatry

## 2021-09-10 ENCOUNTER — Encounter (HOSPITAL_COMMUNITY): Payer: Self-pay | Admitting: Psychiatry

## 2021-09-10 DIAGNOSIS — G90A Postural orthostatic tachycardia syndrome (POTS): Secondary | ICD-10-CM | POA: Diagnosis present

## 2021-09-10 DIAGNOSIS — Z8659 Personal history of other mental and behavioral disorders: Secondary | ICD-10-CM

## 2021-09-10 DIAGNOSIS — Z7984 Long term (current) use of oral hypoglycemic drugs: Secondary | ICD-10-CM

## 2021-09-10 DIAGNOSIS — R45851 Suicidal ideations: Secondary | ICD-10-CM | POA: Diagnosis present

## 2021-09-10 DIAGNOSIS — Z79890 Hormone replacement therapy (postmenopausal): Secondary | ICD-10-CM

## 2021-09-10 DIAGNOSIS — F429 Obsessive-compulsive disorder, unspecified: Secondary | ICD-10-CM | POA: Diagnosis present

## 2021-09-10 DIAGNOSIS — Z818 Family history of other mental and behavioral disorders: Secondary | ICD-10-CM

## 2021-09-10 DIAGNOSIS — F4310 Post-traumatic stress disorder, unspecified: Secondary | ICD-10-CM | POA: Diagnosis present

## 2021-09-10 DIAGNOSIS — F250 Schizoaffective disorder, bipolar type: Secondary | ICD-10-CM | POA: Diagnosis present

## 2021-09-10 DIAGNOSIS — F332 Major depressive disorder, recurrent severe without psychotic features: Secondary | ICD-10-CM | POA: Insufficient documentation

## 2021-09-10 DIAGNOSIS — F411 Generalized anxiety disorder: Secondary | ICD-10-CM | POA: Diagnosis present

## 2021-09-10 DIAGNOSIS — E038 Other specified hypothyroidism: Secondary | ICD-10-CM | POA: Diagnosis present

## 2021-09-10 DIAGNOSIS — Z79899 Other long term (current) drug therapy: Secondary | ICD-10-CM | POA: Diagnosis not present

## 2021-09-10 DIAGNOSIS — Z20822 Contact with and (suspected) exposure to covid-19: Secondary | ICD-10-CM | POA: Diagnosis present

## 2021-09-10 DIAGNOSIS — G4700 Insomnia, unspecified: Secondary | ICD-10-CM | POA: Diagnosis present

## 2021-09-10 LAB — RESP PANEL BY RT-PCR (FLU A&B, COVID) ARPGX2
Influenza A by PCR: NEGATIVE
Influenza B by PCR: NEGATIVE
SARS Coronavirus 2 by RT PCR: NEGATIVE

## 2021-09-10 MED ORDER — HYDROXYZINE HCL 25 MG PO TABS
25.0000 mg | ORAL_TABLET | Freq: Three times a day (TID) | ORAL | Status: DC | PRN
  Administered 2021-09-10 – 2021-09-17 (×7): 25 mg via ORAL
  Filled 2021-09-10 (×7): qty 1

## 2021-09-10 MED ORDER — LEVONORGEST-ETH ESTRAD 91-DAY 0.15-0.03 &0.01 MG PO TABS: 1.0000 | ORAL_TABLET | Freq: Every day | ORAL | Status: DC

## 2021-09-10 MED ORDER — FLUOXETINE HCL 20 MG PO CAPS
20.0000 mg | ORAL_CAPSULE | Freq: Every day | ORAL | Status: DC
  Administered 2021-09-11: 20 mg via ORAL
  Filled 2021-09-10 (×2): qty 1

## 2021-09-10 MED ORDER — OLANZAPINE 5 MG PO TABS
5.0000 mg | ORAL_TABLET | Freq: Every day | ORAL | Status: DC
  Administered 2021-09-10: 5 mg via ORAL
  Filled 2021-09-10 (×3): qty 1

## 2021-09-10 MED ORDER — LEVOTHYROXINE SODIUM 75 MCG PO TABS
75.0000 ug | ORAL_TABLET | Freq: Every day | ORAL | Status: DC
  Administered 2021-09-11 – 2021-09-18 (×8): 75 ug via ORAL
  Filled 2021-09-10 (×11): qty 1

## 2021-09-10 MED ORDER — TRAZODONE HCL 50 MG PO TABS
50.0000 mg | ORAL_TABLET | Freq: Every evening | ORAL | Status: DC | PRN
  Administered 2021-09-10: 50 mg via ORAL
  Filled 2021-09-10: qty 1

## 2021-09-10 MED ORDER — LITHIUM CARBONATE ER 450 MG PO TBCR
450.0000 mg | EXTENDED_RELEASE_TABLET | Freq: Two times a day (BID) | ORAL | Status: DC
  Administered 2021-09-10 – 2021-09-11 (×2): 450 mg via ORAL
  Filled 2021-09-10 (×5): qty 1

## 2021-09-10 MED ORDER — TOPIRAMATE 25 MG PO TABS
25.0000 mg | ORAL_TABLET | Freq: Two times a day (BID) | ORAL | Status: DC
  Administered 2021-09-10 – 2021-09-18 (×16): 25 mg via ORAL
  Filled 2021-09-10 (×24): qty 1

## 2021-09-10 MED ORDER — METFORMIN HCL 500 MG PO TABS
500.0000 mg | ORAL_TABLET | Freq: Every day | ORAL | Status: DC
  Administered 2021-09-11 – 2021-09-18 (×8): 500 mg via ORAL
  Filled 2021-09-10 (×10): qty 1

## 2021-09-10 NOTE — H&P (Signed)
Behavioral Health Medical Screening Exam ? ?Sabrina Bennett is an 21 y.o. female with past documented psychiatric history of generalized anxiety disorder, major depressive disorder, PTSD, OCD who presents to [facility] voluntarily as a walk-in for assessment of suicidal thoughts and depression. She is accompanied by her mother and boyfriend. 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 Works full time at daycare. Hospitalized at [facility] 05/03/22; outpatient psychiatrist Levada Schilling, no therapist at this time.  ? ?Per chart review patient presented to [facility] 09/09/2021 with boyfriend and similar presentation. Patient was recommended for overnight observation and per note requested discharge and contracted for safety.  ? ?Patient reports increased need for sleep, low interest, feelings of guilt and worthlessness, poor energy, concentration, inconsistent appetite, with increased intrusive thoughts of suicide throughout the day with multiple plans; unable to contract for safety. She denies any homicidal ideations, auditory or visual hallucinations at this time.  ? ?Collateral: Jackelyn Hoehn (mother) lobby ?Endorses depressed mood with increased suicidal thoughts in patient. States patient didn't feel comfortable last night and decided to come home. Reports increased concern given patient history and recent changes in patient.  ? ?Total Time spent with patient: 20 minutes ? ?Psychiatric Specialty Exam: ?Physical Exam ?Vitals and nursing note reviewed.  ?Constitutional:   ?   Appearance: Normal appearance. She is not ill-appearing or diaphoretic.  ?HENT:  ?   Head: Normocephalic.  ?   Nose: Nose normal.  ?   Mouth/Throat:  ?   Mouth: Mucous membranes are moist.  ?   Pharynx: Oropharynx is clear.  ?Eyes:  ?   Pupils: Pupils are equal, round, and reactive to light.  ?Cardiovascular:  ?   Rate and Rhythm: Normal rate.  ?   Pulses: Normal pulses.  ?Pulmonary:  ?    Effort: Pulmonary effort is normal.  ?Abdominal:  ?   General: Abdomen is flat.  ?Musculoskeletal:     ?   General: Normal range of motion.  ?   Cervical back: Normal range of motion.  ?Skin: ?   General: Skin is warm and dry.  ?Neurological:  ?   Mental Status: She is alert. Mental status is at baseline.  ?Psychiatric:     ?   Attention and Perception: Attention normal.     ?   Mood and Affect: Mood is depressed. Affect is tearful.     ?   Speech: Speech normal.     ?   Behavior: Behavior is withdrawn. Behavior is cooperative.     ?   Thought Content: Thought content is not paranoid or delusional. Thought content includes suicidal ideation. Thought content does not include homicidal ideation. Thought content includes suicidal plan. Thought content does not include homicidal plan.     ?   Cognition and Memory: Cognition and memory normal.     ?   Judgment: Judgment is impulsive.  ? ?Review of Systems  ?Constitutional:  Positive for activity change, appetite change and fatigue.  ?Psychiatric/Behavioral:  Positive for decreased concentration, dysphoric mood and suicidal ideas. The patient is nervous/anxious.   ?All other systems reviewed and are negative. ?There were no vitals taken for this visit.There is no height or weight on file to calculate BMI. ?General Appearance: Casual ?Eye Contact:  Fair ?Speech:  Clear and Coherent ?Volume:  Normal ?Mood:  Depressed and Hopeless ?Affect:  Depressed and Tearful ?Thought Process:  Coherent ?Orientation:  Full (Time, Place, and Person) ?Thought Content:  Rumination and intrusive suicidal thoughts ?Suicidal Thoughts:  Yes.  with intent/plan ?Homicidal Thoughts:  No ?Memory:  Immediate;   Good ?Recent;   Good ?Remote;   Good ?Judgement:  Good ?Insight:  Good ?Psychomotor Activity:  Normal ?Concentration: Concentration: Good and Attention Span: Good ?Recall:  Good ?Fund of [place] ?Language: Good ?Akathisia:  NA ?Handed:  Right ?AIMS (if indicated):    ?Assets:   Communication Skills ?Desire for Improvement ?Financial Resources/Insurance ?Housing ?Intimacy ?Physical Health ?Resilience ?Social Support ?Talents/Skills ?Transportation ?Vocational/Educational ?Sleep:    ? ?Musculoskeletal: ?Strength & Muscle Tone: within normal limits ?Gait & Station: normal ?Patient leans: N/A ? ?There were no vitals taken for this visit. ? ?Recommendations: ?Based on my evaluation the patient appears to have an emergency medical condition for which I recommend the patient be transferred to the emergency department for further evaluation. ?Patient accepted to [facility] ? ?Inda Merlin, NP ?09/10/2021, 3:49 PM ? ?

## 2021-09-10 NOTE — Tx Team (Signed)
Initial Treatment Plan ?09/10/2021 ?8:47 PM ?Sabrina Bennett ?[id number]:[id number] ? ? ? ?PATIENT STRESSORS: ?Marital or family conflict   ?Medication change or noncompliance   ? ? ?PATIENT STRENGTHS: ?Capable of independent living  ?General fund of knowledge  ?Motivation for treatment/growth  ?Supportive family/friends  ? ? ?PATIENT IDENTIFIED PROBLEMS: ?Risk for SI  ?depression  ?Psychosis  ?"Feeling better"  ?  ?  ?  ?  ?  ?  ? ?DISCHARGE CRITERIA:  ?Improved stabilization in mood, thinking, and/or behavior ?Verbal commitment to aftercare and medication compliance ? ?PRELIMINARY DISCHARGE PLAN: ?Attend aftercare/continuing care group ?Attend PHP/IOP ?Outpatient therapy ? ?PATIENT/FAMILY INVOLVEMENT: ?This treatment plan has been presented to and reviewed with the patient, Sabrina Bennett.  The patient and family have been given the opportunity to ask questions and make suggestions. ? ?Delos Haring, RN ?09/10/2021, 8:47 PM ?

## 2021-09-10 NOTE — Progress Notes (Signed)
[facility] Group Notes:  (Nursing/MHT/Case Management/Adjunct) ? ?Date:  09/10/2021  ?Time:  2015 ?Type of Therapy:   wrap up group ? ?Participation Level:  Active ? ?Participation Quality:  Attentive and Sharing ? ?Affect:  Depressed and Flat ? ?Cognitive:  Alert ? ?Insight:  Improving ? ?Engagement in Group:  Engaged ? ?Modes of Intervention:  Clarification, Education, and Socialization ? ?Summary of Progress/Problems: Positive thinking and self-care were discussed. Pt had just been admitted and joined group.  ? ?Johann Capers S ?09/10/2021, 11:37 PM ?

## 2021-09-10 NOTE — Progress Notes (Signed)
Patient ID: Sabrina Bennett, female   DOB: 2001/01/21, 21 y.o.   MRN: [id number] ?Admission Note:  ?D:21 yr female who presents VC in no acute distress for the treatment of SI/ AVH and Depression. Pt appears flat and depressed. Pt was calm and cooperative with admission process. Pt presents with passive SI/ AVH- command and shadows and contracts for safety upon admission. Pt denies HI/ pain at this time. Pt stated she went to the crisis center due to the AVH/ SI , but pt did not feel comfortable staying overnight at the [facility] , so pt and boyfriend thought she could do everything out patient. Pt left that night and today when she was at the mall the voices came back and were louder and she decided to come back to the hospital. ? ?Per Assessment: ?female with past documented psychiatric history of generalized anxiety disorder, major depressive disorder, PTSD, OCD who presents to [facility] voluntarily as a walk-in for assessment of suicidal thoughts and depression. She is accompanied by her mother and boyfriend. 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 Works full time at daycare. Hospitalized at [facility] 05/03/22; outpatient psychiatrist Levada Schilling, no therapist at this time.  ?  ?Per chart review patient presented to [facility] 09/09/2021 with boyfriend and similar presentation. Patient was recommended for overnight observation and per note requested discharge and contracted for safety.  ?  ?Patient reports increased need for sleep, low interest, feelings of guilt and worthlessness, poor energy, concentration, inconsistent appetite, with increased intrusive thoughts of suicide throughout the day with multiple plans; unable to contract for safety. ? ?A:Skin was assessed(Jackie Charity fundraiser) and found to be clear of any abnormal marks apart from old surgical scar, bruise R-breast. PT searched and no contraband found, POC and unit policies explained and understanding  verbalized. Consents obtained. Food and fluids offered, and  accepted.  ? ?R:Pt had no additional questions or concerns.  ?

## 2021-09-11 ENCOUNTER — Encounter (HOSPITAL_COMMUNITY): Payer: Self-pay

## 2021-09-11 DIAGNOSIS — F250 Schizoaffective disorder, bipolar type: Principal | ICD-10-CM

## 2021-09-11 DIAGNOSIS — Z8659 Personal history of other mental and behavioral disorders: Secondary | ICD-10-CM

## 2021-09-11 LAB — CBC
HCT: 43.9 % (ref 36.0–46.0)
Hemoglobin: 14 g/dL (ref 12.0–15.0)
MCH: 29.6 pg (ref 26.0–34.0)
MCHC: 31.9 g/dL (ref 30.0–36.0)
MCV: 92.8 fL (ref 80.0–100.0)
Platelets: 244 10*3/uL (ref 150–400)
RBC: 4.73 MIL/uL (ref 3.87–5.11)
RDW: 12.7 % (ref 11.5–15.5)
WBC: 9.6 10*3/uL (ref 4.0–10.5)
nRBC: 0 % (ref 0.0–0.2)

## 2021-09-11 LAB — COMPREHENSIVE METABOLIC PANEL
ALT: 19 U/L (ref 0–44)
AST: 24 U/L (ref 15–41)
Albumin: 3.7 g/dL (ref 3.5–5.0)
Alkaline Phosphatase: 69 U/L (ref 38–126)
Anion gap: 7 (ref 5–15)
BUN: 12 mg/dL (ref 6–20)
CO2: 26 mmol/L (ref 22–32)
Calcium: 9.3 mg/dL (ref 8.9–10.3)
Chloride: 105 mmol/L (ref 98–111)
Creatinine, Ser: 1.11 mg/dL — ABNORMAL HIGH (ref 0.44–1.00)
GFR, Estimated: >60 mL/min (ref >60)
Glucose, Bld: 102 mg/dL — ABNORMAL HIGH (ref 70–99)
Potassium: 4.4 mmol/L (ref 3.5–5.1)
Sodium: 138 mmol/L (ref 135–145)
Total Bilirubin: 0.3 mg/dL (ref 0.3–1.2)
Total Protein: 7.3 g/dL (ref 6.5–8.1)

## 2021-09-11 LAB — TSH: TSH: 3.832 u[IU]/mL (ref 0.350–4.500)

## 2021-09-11 LAB — HEMOGLOBIN A1C
Hgb A1c MFr Bld: 5.3 % (ref 4.8–5.6)
Mean Plasma Glucose: 105.41 mg/dL

## 2021-09-11 LAB — LITHIUM LEVEL: Lithium Lvl: 0.57 mmol/L — ABNORMAL LOW (ref 0.60–1.20)

## 2021-09-11 LAB — LIPID PANEL
Cholesterol: 162 mg/dL (ref 0–200)
HDL: 66 mg/dL (ref >40)
LDL Cholesterol: 85 mg/dL (ref 0–99)
Total CHOL/HDL Ratio: 2.5 RATIO
Triglycerides: 53 mg/dL (ref <150)
VLDL: 11 mg/dL (ref 0–40)

## 2021-09-11 LAB — PREGNANCY, URINE: Preg Test, Ur: NEGATIVE

## 2021-09-11 MED ORDER — RAMELTEON 8 MG PO TABS
8.0000 mg | ORAL_TABLET | Freq: Every day | ORAL | Status: DC
  Administered 2021-09-11 – 2021-09-17 (×7): 8 mg via ORAL
  Filled 2021-09-11 (×9): qty 1

## 2021-09-11 MED ORDER — LEVONORGEST-ETH ESTRAD 91-DAY 0.15-0.03 &0.01 MG PO TABS
1.0000 | ORAL_TABLET | Freq: Every day | ORAL | Status: AC
  Administered 2021-09-12: 1 via ORAL

## 2021-09-11 MED ORDER — SERTRALINE HCL 25 MG PO TABS
25.0000 mg | ORAL_TABLET | Freq: Every day | ORAL | Status: DC
  Administered 2021-09-12: 25 mg via ORAL
  Filled 2021-09-11 (×3): qty 1

## 2021-09-11 MED ORDER — MIDODRINE HCL 2.5 MG PO TABS
2.5000 mg | ORAL_TABLET | Freq: Two times a day (BID) | ORAL | Status: DC
  Administered 2021-09-11 – 2021-09-18 (×14): 2.5 mg via ORAL
  Filled 2021-09-11 (×18): qty 1

## 2021-09-11 MED ORDER — LEVONORGEST-ETH ESTRAD 91-DAY 0.15-0.03 &0.01 MG PO TABS
2.0000 | ORAL_TABLET | Freq: Once | ORAL | Status: AC
  Administered 2021-09-11: 2 via ORAL

## 2021-09-11 MED ORDER — ARIPIPRAZOLE 15 MG PO TABS
15.0000 mg | ORAL_TABLET | Freq: Every day | ORAL | Status: DC
  Administered 2021-09-11 – 2021-09-12 (×2): 15 mg via ORAL
  Filled 2021-09-11 (×5): qty 1

## 2021-09-11 MED ORDER — OLANZAPINE 5 MG PO TABS
5.0000 mg | ORAL_TABLET | Freq: Once | ORAL | Status: AC
  Administered 2021-09-11: 5 mg via ORAL
  Filled 2021-09-11: qty 1

## 2021-09-11 NOTE — Progress Notes (Signed)
Pt complaining of the voices being loud tonight, pt concerned, writer spent 1 :1 time with pt.  ?

## 2021-09-11 NOTE — Progress Notes (Signed)
D:  Patient denied SI and HI, contracts for safety.  Denied A/V hallucinations.   A:  Medications administered per MD orders.  Emotional support and encouragement given patient. R:  Safety maintained with 15 minute checks.  
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
## 2021-09-11 NOTE — Group Note (Signed)
Occupational Therapy Group Note ? ?Group Topic:Communication  ?Group Date: 09/11/2021 ?Start Time: 1400 ?End Time: 1500 ?Facilitators: Ted Mcalpine, OT  ? ?Group Description: Group encouraged increased engagement and participation through discussion focused on communication styles. Patients were educated on the different styles of communication including passive, aggressive, assertive, and passive-aggressive communication. Group members shared and reflected on which styles they most often find themselves communicating in and brainstormed strategies on how to transition and practice a more assertive approach. Further discussion explored how to use assertiveness skills and strategies to further advocate and ask questions as it relates to their treatment plan and mental health.  ? ?Therapeutic Goal(s): ?Identify practical strategies to improve communication skills  ?Identify how to use assertive communication skills to address individual needs and wants ? ? ?Participation Level: Minimal ?  ?Participation Quality: Minimal Cues ?  ?Behavior: Isolative ?  ?Speech/Thought Process: Barely audible ?  ?Affect/Mood: Appropriate ?  ?Insight: Fair ?  ?Judgement: Fair ?  ?Individualization: Pt was passive yet attentive in their participation of group discussion/activity. New communications skills identified  ?Modes of Intervention: Discussion and Education  ?Patient Response to Interventions:  Attentive ?  ?Plan: Continue to engage patient in OT groups 2 - 3x/week. ? ?09/11/2021  ?Ted Mcalpine, OT ?Kerrin Champagne, OT ? ? ? ?

## 2021-09-11 NOTE — Group Note (Signed)
LCSW Group Therapy Note ? ? ?Group Date: 09/11/2021 ?Start Time: 1300 ?End Time: 1400 ? ?Type of Therapy and Topic:  Group Therapy - Healthy vs Unhealthy Coping Skills ? ?Participation Level:  Active  ? ?Description of Group ?The focus of this group was to determine what unhealthy coping techniques typically are used by group members and what healthy coping techniques would be helpful in coping with various problems. Patients were guided in becoming aware of the differences between healthy and unhealthy coping techniques. Patients were asked to identify 2-3 healthy coping skills they would like to learn to use more effectively. ? ?Therapeutic Goals ?Patients learned that coping is what human beings do all day long to deal with various situations in their lives ?Patients defined and discussed healthy vs unhealthy coping techniques ?Patients identified their preferred coping techniques and identified whether these were healthy or unhealthy ?Patients determined 2-3 healthy coping skills they would like to become more familiar with and use more often. ?Patients provided support and ideas to each other ? ? ?Summary of Patient Progress:  Due to the acuity and complex discharge plans, group was not held. Patient was provided therapeutic worksheets and asked to meet with CSW as needed. ? ? ?Therapeutic Modalities ?Cognitive Behavioral Therapy ?Motivational Interviewing ? ?Darleen Crocker, LCSWA ?09/11/2021  1:32 PM   ? ?

## 2021-09-11 NOTE — Progress Notes (Signed)
?   09/11/21 2100  ?Psych Admission Type (Psych Patients Only)  ?Admission Status Voluntary  ?Psychosocial Assessment  ?Patient Complaints Anxiety  ?Eye Contact Fair  ?Facial Expression Anxious  ?Affect Anxious  ?Speech Soft  ?Interaction Assertive  ?Motor Activity Slow  ?Appearance/Hygiene Unremarkable  ?Behavior Characteristics Cooperative  ?Mood Pleasant;Sad  ?Aggressive Behavior  ?Effect No apparent injury  ?Thought Process  ?Coherency WDL  ?Content WDL  ?Delusions WDL  ?Perception Hallucinations  ?Hallucination Auditory;Visual  ?Judgment Impaired  ?Confusion None  ?Danger to Self  ?Current suicidal ideation? Denies  ?Danger to Others  ?Danger to Others None reported or observed  ? ? ?
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
## 2021-09-11 NOTE — BHH Group Notes (Signed)
Spiritual care group on grief and loss facilitated by chaplain Dyanne Carrel, [facility]  ? ?Group Goal:  ? ?Support / Education around grief and loss  ? ?Members engage in facilitated group support and psycho-social education.  ? ?Group Description:  ? ?Following introductions and group rules, group members engaged in facilitated group dialog and support around topic of loss, with particular support around experiences of loss in their lives. Group Identified types of loss (relationships / self / things) and identified patterns, circumstances, and changes that precipitate losses. Reflected on thoughts / feelings around loss, normalized grief responses, and recognized variety in grief experience. Group noted Worden's four tasks of grief in discussion.  ? ?Group drew on Adlerian / Rogerian, narrative, MI,  ? ?Patient Progress: Delissa attended group.  Although participation was minimal, she showed engagement for the time that she was in group. ? ?[employer], [facility] ?Pager, [phone] ?

## 2021-09-11 NOTE — BH IP Treatment Plan (Addendum)
Interdisciplinary Treatment and Diagnostic Plan Update ? ?09/11/2021 ?Time of Session: 9:10am  ?Sabrina Bennett ?MRN: [id number] ? ?Principal Diagnosis: MDD (major depressive disorder), recurrent episode, severe ([place]) ? ?Secondary Diagnoses: Principal Problem: ?  MDD (major depressive disorder), recurrent episode, severe ([place]) ? ? ?Current Medications:  ?Current Facility-Administered Medications  ?Medication Dose Route Frequency Provider Last Rate Last Admin  ? ARIPiprazole (ABILIFY) tablet 15 mg  15 mg Oral Daily Rosezetta Schlatter, MD   15 mg at 09/11/21 1309  ? hydrOXYzine (ATARAX) tablet 25 mg  25 mg Oral TID PRN Bobbitt, Hessie Diener E, NP   25 mg at 09/10/21 2157  ? [START ON 09/12/2021] Levonorgestrel-Ethinyl Estradiol (AMETHIA) 0.15-0.03 &0.01 MG tablet 1 tablet  1 tablet Oral QHS Hill, Jackie Plum, MD      ? Levonorgestrel-Ethinyl Estradiol (AMETHIA) 0.15-0.03 &0.01 MG tablet 2 tablet  2 tablet Oral Once Hill, Jackie Plum, MD      ? levothyroxine (SYNTHROID) tablet 75 mcg  75 mcg Oral Q0600 Bobbitt, Shalon E, NP   75 mcg at 09/11/21 2505  ? metFORMIN (GLUCOPHAGE) tablet 500 mg  500 mg Oral Q breakfast Bobbitt, Shalon E, NP   500 mg at 09/11/21 0950  ? [START ON 09/12/2021] sertraline (ZOLOFT) tablet 25 mg  25 mg Oral Daily Rosezetta Schlatter, MD      ? topiramate (TOPAMAX) tablet 25 mg  25 mg Oral BID Bobbitt, Shalon E, NP   25 mg at 09/11/21 0951  ? traZODone (DESYREL) tablet 50 mg  50 mg Oral QHS PRN Bobbitt, Shalon E, NP   50 mg at 09/10/21 2157  ? ?PTA Medications: ?Medications Prior to Admission  ?Medication Sig Dispense Refill Last Dose  ? cyclobenzaprine (FLEXERIL) 10 MG tablet Take 1 tablet (10 mg total) by mouth at bedtime for 14 days. 14 tablet 0   ? FLUoxetine (PROZAC) 20 MG capsule Take 1 capsule (20 mg total) by mouth daily. 30 capsule 0   ? levothyroxine (SYNTHROID) 75 MCG tablet Take 1 tablet (75 mcg total) by mouth daily. 30 tablet 0   ? lithium carbonate (ESKALITH) 450 MG CR tablet Take 1 tablet (450  mg total) by mouth 2 (two) times daily. 60 tablet 0   ? metFORMIN (GLUCOPHAGE) 500 MG tablet Take 1 tablet (500 mg total) by mouth daily with breakfast. 30 tablet 0   ? midodrine (PROAMATINE) 2.5 MG tablet Take 2.5 mg by mouth 2 (two) times daily.     ? OLANZapine (ZYPREXA) 5 MG tablet Take 1 tablet (5 mg total) by mouth at bedtime. 30 tablet 0   ? QELBREE 200 MG 24 hr capsule Take 200 mg by mouth every morning.     ? SIMPESSE 0.15-0.03 &0.01 MG tablet Take 1 tablet by mouth at bedtime.     ? topiramate (TOPAMAX) 25 MG tablet Take 1 tablet (25 mg total) by mouth 2 (two) times daily. 60 tablet 0   ? traZODone (DESYREL) 50 MG tablet Take 1 tablet (50 mg total) by mouth at bedtime as needed for sleep. 30 tablet 0   ? ? ?Patient Stressors: Marital or family conflict   ?Medication change or noncompliance   ? ?Patient Strengths: Capable of independent living  ?General fund of knowledge  ?Motivation for treatment/growth  ?Supportive family/friends  ? ?Treatment Modalities: Medication Management, Group therapy, Case management,  ?1 to 1 session with clinician, Psychoeducation, Recreational therapy. ? ? ?Physician Treatment Plan for Primary Diagnosis: MDD (major depressive disorder), recurrent episode, severe ([place]) ?Long Term Goal(s):    ? ?  Short Term Goals:   ? ?Medication Management: Evaluate patient's response, side effects, and tolerance of medication regimen. ? ?Therapeutic Interventions: 1 to 1 sessions, Unit Group sessions and Medication administration. ? ?Evaluation of Outcomes: Not Met ? ?Physician Treatment Plan for Secondary Diagnosis: Principal Problem: ?  MDD (major depressive disorder), recurrent episode, severe ([place]) ? ?Long Term Goal(s):    ? ?Short Term Goals:      ? ?Medication Management: Evaluate patient's response, side effects, and tolerance of medication regimen. ? ?Therapeutic Interventions: 1 to 1 sessions, Unit Group sessions and Medication administration. ? ?Evaluation of Outcomes: Not Met ? ? ?RN  Treatment Plan for Primary Diagnosis: MDD (major depressive disorder), recurrent episode, severe ([place]) ?Long Term Goal(s): Knowledge of disease and therapeutic regimen to maintain health will improve ? ?Short Term Goals: Ability to remain free from injury will improve, Ability to participate in decision making will improve, Ability to verbalize feelings will improve, Ability to disclose and discuss suicidal ideas, and Ability to identify and develop effective coping behaviors will improve ? ?Medication Management: RN will administer medications as ordered by provider, will assess and evaluate patient's response and provide education to patient for prescribed medication. RN will report any adverse and/or side effects to prescribing provider. ? ?Therapeutic Interventions: 1 on 1 counseling sessions, Psychoeducation, Medication administration, Evaluate responses to treatment, Monitor vital signs and CBGs as ordered, Perform/monitor CIWA, COWS, AIMS and Fall Risk screenings as ordered, Perform wound care treatments as ordered. ? ?Evaluation of Outcomes: Not Met ? ? ?LCSW Treatment Plan for Primary Diagnosis: MDD (major depressive disorder), recurrent episode, severe ([place]) ?Long Term Goal(s): Safe transition to appropriate next level of care at discharge, Engage patient in therapeutic group addressing interpersonal concerns. ? ?Short Term Goals: Engage patient in aftercare planning with referrals and resources, Increase social support, Increase emotional regulation, Facilitate acceptance of mental health diagnosis and concerns, Identify triggers associated with mental health/substance abuse issues, and Increase skills for wellness and recovery ? ?Therapeutic Interventions: Assess for all discharge needs, 1 to 1 time with Education officer, museum, Explore available resources and support systems, Assess for adequacy in community support network, Educate family and significant other(s) on suicide prevention, Complete Psychosocial  Assessment, Interpersonal group therapy. ? ?Evaluation of Outcomes: Not Met ? ? ?Progress in Treatment: ?Attending groups: Yes. ?Participating in groups: Yes. ?Taking medication as prescribed: Yes. ?Toleration medication: Yes. ?Family/Significant other contact made: Yes, individual(s) contacted:  Boyfriend  ?Patient understands diagnosis: Yes. ?Discussing patient identified problems/goals with staff: Yes. ?Medical problems stabilized or resolved: Yes. ?Denies suicidal/homicidal ideation: Yes. ?Issues/concerns per patient self-inventory: No. ? ? ?New problem(s) identified: No, Describe:  None  ? ?New Short Term/Long Term Goal(s): medication stabilization, elimination of SI thoughts, development of comprehensive mental wellness plan.  ? ?Patient Goals: Did not attend  ? ?Discharge Plan or Barriers: Patient recently admitted. CSW will continue to follow and assess for appropriate referrals and possible discharge planning.  ? ?Reason for Continuation of Hospitalization: Anxiety ?Depression ?Hallucinations ?Medication stabilization ?Suicidal ideation ? ?Estimated Length of Stay: 3 to 5 days  ? ?Last 3 [place] Suicide Severity Risk Score: ?[place] Admission (Current) from OP Visit from 09/10/2021 in [place] [facility] ED from 09/09/2021 in [facility] ED from 09/03/2021 in [place] DEPT  ?C-SSRS RISK CATEGORY High Risk High Risk No Risk  ? ?  ? ? ?Last PHQ 2/9 Scores: ?   ? View : No data to display.  ?  ?  ?  ? ? ?  Scribe for Treatment Team: ?Darleen Crocker, Latanya Presser ?09/11/2021 ?1:51 PM ? ? ?

## 2021-09-11 NOTE — Progress Notes (Signed)
Barbara Cower ordered 1 x 5mg  Zyprexa due to pt expressing loud voices ?

## 2021-09-11 NOTE — BH IP Treatment Plan (Signed)
Interdisciplinary Treatment and Diagnostic Plan Update ? ?09/11/2021 ?Time of Session: 9:05am  ?Sabrina Bennett ?MRN: [id number] ? ?Principal Diagnosis: MDD (major depressive disorder), recurrent episode, severe ([place]) ? ?Secondary Diagnoses: Principal Problem: ?  MDD (major depressive disorder), recurrent episode, severe ([place]) ? ? ?Current Medications:  ?Current Facility-Administered Medications  ?Medication Dose Route Frequency Provider Last Rate Last Admin  ? ARIPiprazole (ABILIFY) tablet 15 mg  15 mg Oral Daily Rosezetta Schlatter, MD   15 mg at 09/11/21 1309  ? hydrOXYzine (ATARAX) tablet 25 mg  25 mg Oral TID PRN Bobbitt, Hessie Diener E, NP   25 mg at 09/10/21 2157  ? [START ON 09/12/2021] Levonorgestrel-Ethinyl Estradiol (AMETHIA) 0.15-0.03 &0.01 MG tablet 1 tablet  1 tablet Oral QHS Hill, Jackie Plum, MD      ? Levonorgestrel-Ethinyl Estradiol (AMETHIA) 0.15-0.03 &0.01 MG tablet 2 tablet  2 tablet Oral Once Hill, Jackie Plum, MD      ? levothyroxine (SYNTHROID) tablet 75 mcg  75 mcg Oral Q0600 Bobbitt, Shalon E, NP   75 mcg at 09/11/21 4098  ? metFORMIN (GLUCOPHAGE) tablet 500 mg  500 mg Oral Q breakfast Bobbitt, Shalon E, NP   500 mg at 09/11/21 0950  ? [START ON 09/12/2021] sertraline (ZOLOFT) tablet 25 mg  25 mg Oral Daily Rosezetta Schlatter, MD      ? topiramate (TOPAMAX) tablet 25 mg  25 mg Oral BID Bobbitt, Shalon E, NP   25 mg at 09/11/21 0951  ? traZODone (DESYREL) tablet 50 mg  50 mg Oral QHS PRN Bobbitt, Shalon E, NP   50 mg at 09/10/21 2157  ? ?PTA Medications: ?Medications Prior to Admission  ?Medication Sig Dispense Refill Last Dose  ? cyclobenzaprine (FLEXERIL) 10 MG tablet Take 1 tablet (10 mg total) by mouth at bedtime for 14 days. 14 tablet 0   ? FLUoxetine (PROZAC) 20 MG capsule Take 1 capsule (20 mg total) by mouth daily. 30 capsule 0   ? levothyroxine (SYNTHROID) 75 MCG tablet Take 1 tablet (75 mcg total) by mouth daily. 30 tablet 0   ? lithium carbonate (ESKALITH) 450 MG CR tablet Take 1 tablet (450  mg total) by mouth 2 (two) times daily. 60 tablet 0   ? metFORMIN (GLUCOPHAGE) 500 MG tablet Take 1 tablet (500 mg total) by mouth daily with breakfast. 30 tablet 0   ? midodrine (PROAMATINE) 2.5 MG tablet Take 2.5 mg by mouth 2 (two) times daily.     ? OLANZapine (ZYPREXA) 5 MG tablet Take 1 tablet (5 mg total) by mouth at bedtime. 30 tablet 0   ? QELBREE 200 MG 24 hr capsule Take 200 mg by mouth every morning.     ? SIMPESSE 0.15-0.03 &0.01 MG tablet Take 1 tablet by mouth at bedtime.     ? topiramate (TOPAMAX) 25 MG tablet Take 1 tablet (25 mg total) by mouth 2 (two) times daily. 60 tablet 0   ? traZODone (DESYREL) 50 MG tablet Take 1 tablet (50 mg total) by mouth at bedtime as needed for sleep. 30 tablet 0   ? ? ?Patient Stressors: Marital or family conflict   ?Medication change or noncompliance   ? ?Patient Strengths: Capable of independent living  ?General fund of knowledge  ?Motivation for treatment/growth  ?Supportive family/friends  ? ?Treatment Modalities: Medication Management, Group therapy, Case management,  ?1 to 1 session with clinician, Psychoeducation, Recreational therapy. ? ? ?Physician Treatment Plan for Primary Diagnosis: MDD (major depressive disorder), recurrent episode, severe ([place]) ?Long Term Goal(s):    ? ?  Short Term Goals:   ? ?Medication Management: Evaluate patient's response, side effects, and tolerance of medication regimen. ? ?Therapeutic Interventions: 1 to 1 sessions, Unit Group sessions and Medication administration. ? ?Evaluation of Outcomes: Not Met ? ?Physician Treatment Plan for Secondary Diagnosis: Principal Problem: ?  MDD (major depressive disorder), recurrent episode, severe ([place]) ? ?Long Term Goal(s):    ? ?Short Term Goals:      ? ?Medication Management: Evaluate patient's response, side effects, and tolerance of medication regimen. ? ?Therapeutic Interventions: 1 to 1 sessions, Unit Group sessions and Medication administration. ? ?Evaluation of Outcomes: Not Met ? ? ?RN  Treatment Plan for Primary Diagnosis: MDD (major depressive disorder), recurrent episode, severe ([place]) ?Long Term Goal(s): Knowledge of disease and therapeutic regimen to maintain health will improve ? ?Short Term Goals: Ability to remain free from injury will improve, Ability to participate in decision making will improve, Ability to verbalize feelings will improve, Ability to disclose and discuss suicidal ideas, and Ability to identify and develop effective coping behaviors will improve ? ?Medication Management: RN will administer medications as ordered by provider, will assess and evaluate patient's response and provide education to patient for prescribed medication. RN will report any adverse and/or side effects to prescribing provider. ? ?Therapeutic Interventions: 1 on 1 counseling sessions, Psychoeducation, Medication administration, Evaluate responses to treatment, Monitor vital signs and CBGs as ordered, Perform/monitor CIWA, COWS, AIMS and Fall Risk screenings as ordered, Perform wound care treatments as ordered. ? ?Evaluation of Outcomes: Not Met ? ? ?LCSW Treatment Plan for Primary Diagnosis: MDD (major depressive disorder), recurrent episode, severe ([place]) ?Long Term Goal(s): Safe transition to appropriate next level of care at discharge, Engage patient in therapeutic group addressing interpersonal concerns. ? ?Short Term Goals: Engage patient in aftercare planning with referrals and resources, Increase social support, Increase emotional regulation, Facilitate acceptance of mental health diagnosis and concerns, Identify triggers associated with mental health/substance abuse issues, and Increase skills for wellness and recovery ? ?Therapeutic Interventions: Assess for all discharge needs, 1 to 1 time with Education officer, museum, Explore available resources and support systems, Assess for adequacy in community support network, Educate family and significant other(s) on suicide prevention, Complete Psychosocial  Assessment, Interpersonal group therapy. ? ?Evaluation of Outcomes: Not Met ? ? ?Progress in Treatment: ?Attending groups: Yes. ?Participating in groups: Yes. ?Taking medication as prescribed: Yes. ?Toleration medication: Yes. ?Family/Significant other contact made: Yes, individual(s) contacted:  Boyfriend  ?Patient understands diagnosis: Yes. ?Discussing patient identified problems/goals with staff: Yes. ?Medical problems stabilized or resolved: Yes. ?Denies suicidal/homicidal ideation: Yes. ?Issues/concerns per patient self-inventory: No. ? ? ?New problem(s) identified: No, Describe:  None  ? ?New Short Term/Long Term Goal(s): medication stabilization, elimination of SI thoughts, development of comprehensive mental wellness plan.  ? ?Patient Goals: Did not attend  ? ?Discharge Plan or Barriers: Patient recently admitted. CSW will continue to follow and assess for appropriate referrals and possible discharge planning.  ? ?Reason for Continuation of Hospitalization: Anxiety ?Depression ?Hallucinations ?Medication stabilization ?Suicidal ideation ? ?Estimated Length of Stay:  3 to 7 days  ? ?Last 3 [place] Suicide Severity Risk Score: ?[place] Admission (Current) from OP Visit from 09/10/2021 in [place] [facility] ED from 09/09/2021 in [facility] ED from 09/03/2021 in [place] DEPT  ?C-SSRS RISK CATEGORY High Risk High Risk No Risk  ? ?  ? ? ?Last PHQ 2/9 Scores: ?   ? View : No data to display.  ?  ?  ?  ? ? ?  Scribe for Treatment Team: ?Darleen Crocker, Latanya Presser ?09/11/2021 ?2:01 PM ? ? ?

## 2021-09-11 NOTE — Progress Notes (Signed)
?   09/11/21 0500  ?Sleep  ?Number of Hours 6.75  ? ? ?

## 2021-09-11 NOTE — Plan of Care (Signed)
Nurse discussed anxiety, depression and coping skills with patient.  
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
## 2021-09-11 NOTE — Group Note (Signed)
Date:  09/11/2021 ?Time:  10:36 AM ? ?Group Topic/Focus:  ?Goals Group:   The focus of this group is to help patients establish daily goals to achieve during treatment and discuss how the patient can incorporate goal setting into their daily lives to aide in recovery. ?Orientation:   The focus of this group is to educate the patient on the purpose and policies of crisis stabilization and provide a format to answer questions about their admission.  The group details unit policies and expectations of patients while admitted. ? ? ? ?Participation Level:  Active ? ?Participation Quality:  Appropriate ? ?Affect:  Appropriate ? ?Cognitive:  Appropriate ? ?Insight: Appropriate ? ?Engagement in Group:  Engaged ? ?Modes of Intervention:  Discussion ? ?Additional Comments:  Pt wants to get out of her room and participate more. ? ?[name] ?09/11/2021, 10:36 AM ? ?

## 2021-09-11 NOTE — Group Note (Signed)
Due to social work team being short staffed and high level of admissions, social work group unable to be held. ? ? ?Baley Lorimer, LCSW, LCAS ?Clincal Social Worker  ?[facility] ?

## 2021-09-11 NOTE — Progress Notes (Signed)
Pt  denies SI/HI/AVH.  Pt presents with flat affect and depressed mood. Pt appropriate with peers and staff with daily interactions. Pt's safety is maintained with q 15 min checks in place. ?

## 2021-09-12 LAB — URINALYSIS, COMPLETE (UACMP) WITH MICROSCOPIC
Bacteria, UA: NONE SEEN
Bilirubin Urine: NEGATIVE
Glucose, UA: NEGATIVE mg/dL
Hgb urine dipstick: NEGATIVE
Ketones, ur: NEGATIVE mg/dL
Nitrite: NEGATIVE
Protein, ur: 30 mg/dL — AB
Specific Gravity, Urine: 1.026 (ref 1.005–1.030)
pH: 6 (ref 5.0–8.0)

## 2021-09-12 LAB — RAPID URINE DRUG SCREEN, HOSP PERFORMED
Amphetamines: NOT DETECTED
Barbiturates: NOT DETECTED
Benzodiazepines: NOT DETECTED
Cocaine: NOT DETECTED
Opiates: NOT DETECTED
Tetrahydrocannabinol: NOT DETECTED

## 2021-09-12 MED ORDER — ARIPIPRAZOLE 10 MG PO TABS
20.0000 mg | ORAL_TABLET | Freq: Every day | ORAL | Status: DC
  Filled 2021-09-12: qty 2

## 2021-09-12 MED ORDER — ARIPIPRAZOLE 5 MG PO TABS
5.0000 mg | ORAL_TABLET | Freq: Once | ORAL | Status: AC
  Administered 2021-09-12: 5 mg via ORAL
  Filled 2021-09-12: qty 1

## 2021-09-12 MED ORDER — HALOPERIDOL LACTATE 5 MG/ML IJ SOLN
5.0000 mg | Freq: Four times a day (QID) | INTRAMUSCULAR | Status: DC | PRN
Start: 2021-09-12 — End: 2021-09-18

## 2021-09-12 MED ORDER — HALOPERIDOL 5 MG PO TABS
5.0000 mg | ORAL_TABLET | Freq: Four times a day (QID) | ORAL | Status: DC | PRN
  Administered 2021-09-13 (×2): 5 mg via ORAL
  Filled 2021-09-12 (×2): qty 1

## 2021-09-12 MED ORDER — ZIPRASIDONE HCL 40 MG PO CAPS
40.0000 mg | ORAL_CAPSULE | Freq: Two times a day (BID) | ORAL | Status: DC
  Administered 2021-09-12 – 2021-09-18 (×12): 40 mg via ORAL
  Filled 2021-09-12 (×18): qty 1

## 2021-09-12 MED ORDER — SERTRALINE HCL 50 MG PO TABS
50.0000 mg | ORAL_TABLET | Freq: Every day | ORAL | Status: DC
  Administered 2021-09-13 – 2021-09-18 (×6): 50 mg via ORAL
  Filled 2021-09-12 (×8): qty 1

## 2021-09-12 NOTE — Progress Notes (Signed)
Received order for Geodon 40 mg now and Haldol 5mg  q 6 hours prn. ?

## 2021-09-12 NOTE — Progress Notes (Signed)
Pt stated the Zyprexa helped the voices get under control. Pt encouraged to ask the doctor to possibly put the Zyprexa PRN in case she has voices that are not calming down.  ?

## 2021-09-12 NOTE — Progress Notes (Signed)
Pt stated she was feeling better compared to earlier today. Pt educated on new medications and educated on notifying staff of changes and AVH . Pt stated she still do not know what her triggers are, so writer informed pt to try to stay aware and notice things if she can.  ? ? ? 09/12/21 2000  ?Psych Admission Type (Psych Patients Only)  ?Admission Status Voluntary  ?Psychosocial Assessment  ?Patient Complaints Anxiety  ?Eye Contact Fair  ?Facial Expression Anxious  ?Affect Anxious  ?Speech Soft  ?Interaction Assertive  ?Motor Activity Slow  ?Appearance/Hygiene Unremarkable  ?Behavior Characteristics Cooperative  ?Mood Pleasant  ?Aggressive Behavior  ?Effect No apparent injury  ?Thought Process  ?Coherency WDL  ?Content WDL  ?Delusions WDL  ?Perception Hallucinations  ?Hallucination Auditory;Visual  ?Judgment Impaired  ?Confusion None  ?Danger to Self  ?Current suicidal ideation? Denies  ?Danger to Others  ?Danger to Others None reported or observed  ? ? ?

## 2021-09-12 NOTE — Progress Notes (Signed)
?   09/12/21 0646  ?Sleep  ?Number of Hours 8  ? ? ?

## 2021-09-12 NOTE — Progress Notes (Signed)
MHT notified RN that pt's voices are getting louder and telling pt to hit the wall.  MHT observed pt hit wall 3 times. No swelling or pain.  Slight redness on knuckles.  Ice pack given to pt to put on her right hand.  MD notified. ?

## 2021-09-12 NOTE — Progress Notes (Signed)
Urine collected and pharmacy picked up specimen to take to lab. ?

## 2021-09-12 NOTE — Progress Notes (Signed)
[facility] MD Progress Note ? ?09/12/2021 3:07 PM ?Sabrina Bennett  ?MRN:  [id number] ?Reason for admission:Sabrina Bennett is a 21 year old female with a psychiatric history of bipolar disorder, depression, GAD, OCD, ADHD- inattentive type, ARFID, and binge-eating disorder, as well as a medical history of hypothyroidism and POTS who presented to [facility] as a walk-in and admitted voluntarily for worsening AVH with command hallucinations telling her to kill herself as well as passive SI.  Patient was last admitted to [facility] in December 2022. ?  ?Of note, patient went to [facility] on 5/6 for SI, was recommended for observation, and left prior to completing.  She went home, and on 5/7, had worsening of command AH so presented as walk-in to [facility] [place] [facility]. ? ?Subjective: Overnight, the patient reported an increase in the volume of her auditory hallucinations.  PRN Zyprexa 5 mg given x1. ? ?On assessment today, the patient reports an improved mood, good sleep, and intact appetite. Her AH was quieted after last night's episode, but just prior to being seen, patient reports return of her AH telling her to punch the wall. She went to the nurse's station to avoid acquiescing to the commands.  She denies somatic symptoms.  She denies SI/HI/VH and paranoia, but reports constant AH. ? ?Principal Problem: Schizoaffective disorder, bipolar type (HCC) ?Diagnosis: Principal Problem: ?  Schizoaffective disorder, bipolar type (HCC) ?Active Problems: ?  POTS (postural orthostatic tachycardia syndrome) ?  Subclinical hypothyroidism ?  PTSD (post-traumatic stress disorder) ?  History of OCD (obsessive compulsive disorder) ? ? ?Total Time spent with patient: 20 minutes ? ?Past Psychiatric History: See H&P ? ?Past Medical History:  ?History reviewed. No pertinent past medical history.  ?History reviewed. No pertinent surgical history. ?Family History: History reviewed. No pertinent family history. ?Family Psychiatric  History: See H&P ?Social History:  ?Social History   ? ?Substance and Sexual Activity  ?Alcohol Use Yes  ?   ?Social History  ? ?Substance and Sexual Activity  ?Drug Use Never  ?  ?Social History  ? ?Socioeconomic History  ? Marital status: Single  ?  Spouse name: Not on file  ? Number of children: Not on file  ? Years of education: Not on file  ? Highest education level: Not on file  ?Occupational History  ? Not on file  ?Tobacco Use  ? Smoking status: Never  ?  Passive exposure: Never  ? Smokeless tobacco: Never  ?Vaping Use  ? Vaping Use: Never used  ?Substance and Sexual Activity  ? Alcohol use: Yes  ? Drug use: Never  ? Sexual activity: Yes  ?  Birth control/protection: Pill, Condom  ?Other Topics Concern  ? Not on file  ?Social History Narrative  ? Not on file  ? ?Social Determinants of Health  ? ?Financial Resource Strain: Not on file  ?Food Insecurity: Not on file  ?Transportation Needs: Not on file  ?Physical Activity: Not on file  ?Stress: Not on file  ?Social Connections: Not on file  ? ?Additional Social History:  ?  ?  ? ?Sleep: Good ? ?Appetite:  Good ? ?Current Medications: ?Current Facility-Administered Medications  ?Medication Dose Route Frequency Provider Last Rate Last Admin  ? [START ON 09/13/2021] ARIPiprazole (ABILIFY) tablet 20 mg  20 mg Oral Daily Lamar Sprinkles, MD      ? ARIPiprazole (ABILIFY) tablet 5 mg  5 mg Oral Once Lamar Sprinkles, MD      ? hydrOXYzine (ATARAX) tablet 25 mg  25 mg Oral TID PRN Bobbitt, Shalon E,  NP   25 mg at 09/11/21 2131  ? Levonorgestrel-Ethinyl Estradiol (AMETHIA) 0.15-0.03 &0.01 MG tablet 1 tablet  1 tablet Oral QHS Hill, Shelbie Hutching, MD      ? levothyroxine (SYNTHROID) tablet 75 mcg  75 mcg Oral Q0600 Bobbitt, Shalon E, NP   75 mcg at 09/12/21 8563  ? metFORMIN (GLUCOPHAGE) tablet 500 mg  500 mg Oral Q breakfast Bobbitt, Shalon E, NP   500 mg at 09/12/21 0947  ? midodrine (PROAMATINE) tablet 2.5 mg  2.5 mg Oral BID WC Lamar Sprinkles, MD   2.5 mg at 09/12/21 0946  ? ramelteon (ROZEREM) tablet 8 mg  8 mg Oral  QHS Lamar Sprinkles, MD   8 mg at 09/11/21 2131  ? [START ON 09/13/2021] sertraline (ZOLOFT) tablet 50 mg  50 mg Oral Daily Lamar Sprinkles, MD      ? topiramate (TOPAMAX) tablet 25 mg  25 mg Oral BID Bobbitt, Shalon E, NP   25 mg at 09/12/21 0948  ? ? ?Lab Results:  ?Results for orders placed or performed during the hospital encounter of 09/10/21 (from the past 48 hour(s))  ?Resp Panel by RT-PCR (Flu A&B, Covid)     Status: None  ? Collection Time: 09/10/21  5:52 PM  ?Result Value Ref Range  ? SARS Coronavirus 2 by RT PCR NEGATIVE NEGATIVE  ?  Comment: (NOTE) ?SARS-CoV-2 target nucleic acids are NOT DETECTED. ? ?The SARS-CoV-2 RNA is generally detectable in upper respiratory ?specimens during the acute phase of infection. The lowest ?concentration of SARS-CoV-2 viral copies this assay can detect is ?138 copies/mL. A negative result does not preclude SARS-Cov-2 ?infection and should not be used as the sole basis for treatment or ?other patient management decisions. A negative result may occur with  ?improper specimen collection/handling, submission of specimen other ?than nasopharyngeal swab, presence of viral mutation(s) within the ?areas targeted by this assay, and inadequate number of viral ?copies(<138 copies/mL). A negative result must be combined with ?clinical observations, patient history, and epidemiological ?information. The expected result is Negative. ? ?Fact Sheet for Patients:  ?BloggerCourse.com ? ?Fact Sheet for Healthcare Providers:  ?SeriousBroker.it ? ?This test is no t yet approved or cleared by the [place] FDA and  ?has been authorized for detection and/or diagnosis of SARS-CoV-2 by ?FDA under an Emergency Use Authorization (EUA). This EUA will remain  ?in effect (meaning this test can be used) for the duration of the ?COVID-19 declaration under Section 564(b)(1) of the Act, 21 ?U.S.C.section 360bbb-3(b)(1), unless the authorization is  terminated  ?or revoked sooner.  ? ? ?  ? Influenza A by PCR NEGATIVE NEGATIVE  ? Influenza B by PCR NEGATIVE NEGATIVE  ?  Comment: (NOTE) ?The Xpert Xpress SARS-CoV-2/FLU/RSV plus assay is intended as an aid ?in the diagnosis of influenza from Nasopharyngeal swab specimens and ?should not be used as a sole basis for treatment. Nasal washings and ?aspirates are unacceptable for Xpert Xpress SARS-CoV-2/FLU/RSV ?testing. ? ?Fact Sheet for Patients: ?BloggerCourse.com ? ?Fact Sheet for Healthcare Providers: ?SeriousBroker.it ? ?This test is not yet approved or cleared by the [place] FDA and ?has been authorized for detection and/or diagnosis of SARS-CoV-2 by ?FDA under an Emergency Use Authorization (EUA). This EUA will remain ?in effect (meaning this test can be used) for the duration of the ?COVID-19 declaration under Section 564(b)(1) of the Act, 21 U.S.C. ?section 360bbb-3(b)(1), unless the authorization is terminated or ?revoked. ? ?Performed at [facility], [address] ?[place], [place]  [id number] ?  ?Comprehensive metabolic panel     Status: Abnormal  ? Collection Time: 09/11/21  6:29 AM  ?Result Value Ref Range  ? Sodium 138 135 - 145 mmol/L  ? Potassium 4.4 3.5 - 5.1 mmol/L  ? Chloride 105 98 - 111 mmol/L  ? CO2 26 22 - 32 mmol/L  ? Glucose, Bld 102 (H) 70 - 99 mg/dL  ?  Comment: Glucose reference range applies only to samples taken after fasting for at least 8 hours.  ? BUN 12 6 - 20 mg/dL  ? Creatinine, Ser 1.11 (H) 0.44 - 1.00 mg/dL  ? Calcium 9.3 8.9 - 10.3 mg/dL  ? Total Protein 7.3 6.5 - 8.1 g/dL  ? Albumin 3.7 3.5 - 5.0 g/dL  ? AST 24 15 - 41 U/L  ? ALT 19 0 - 44 U/L  ? Alkaline Phosphatase 69 38 - 126 U/L  ? Total Bilirubin 0.3 0.3 - 1.2 mg/dL  ? GFR, Estimated >60 >60 mL/min  ?  Comment: (NOTE) ?Calculated using the CKD-EPI Creatinine Equation (2021) ?  ? Anion gap 7 5 - 15  ?  Comment: Performed at [facility], [address] [place], [place] [id number]  ?CBC     Status: None  ? Collection Time: 09/11/21  6:29 AM  ?Result Value Ref Range  ? WBC 9.6 4.0 - 10.5 K/uL  ? RBC 4.73 3.87 - 5.11 MIL/uL  ? Hemoglobin 14.0

## 2021-09-12 NOTE — Progress Notes (Signed)
The patient shared in group that she didn't have a very good day. She rated her day as a 4 out of 10 and states that going outside helped her out.  ?

## 2021-09-12 NOTE — [facility] Group Notes (Signed)
Adult Orientation Group Note ? ?Date:  09/12/2021 ?Time:  10:50 AM ? ?Group Topic/Focus:  ?Orientation:   The focus of this group is to educate the patient on the purpose and policies of crisis stabilization and provide a format to answer questions about their admission.  The group details unit policies and expectations of patients while admitted. ? ?Participation Level:  Did Not Attend ? ? ?Sabrina Bennett ?09/12/2021, 10:50 AM ?

## 2021-09-13 LAB — GC/CHLAMYDIA PROBE AMP (~~LOC~~) NOT AT ARMC
Chlamydia: NEGATIVE
Comment: NEGATIVE
Comment: NORMAL
Neisseria Gonorrhea: NEGATIVE

## 2021-09-13 LAB — BASIC METABOLIC PANEL
Anion gap: 5 (ref 5–15)
BUN: 13 mg/dL (ref 6–20)
CO2: 22 mmol/L (ref 22–32)
Calcium: 8.8 mg/dL — ABNORMAL LOW (ref 8.9–10.3)
Chloride: 113 mmol/L — ABNORMAL HIGH (ref 98–111)
Creatinine, Ser: 1.06 mg/dL — ABNORMAL HIGH (ref 0.44–1.00)
GFR, Estimated: >60 mL/min (ref >60)
Glucose, Bld: 103 mg/dL — ABNORMAL HIGH (ref 70–99)
Potassium: 3.9 mmol/L (ref 3.5–5.1)
Sodium: 140 mmol/L (ref 135–145)

## 2021-09-13 LAB — RPR: RPR Ser Ql: NONREACTIVE

## 2021-09-13 LAB — HIV ANTIBODY (ROUTINE TESTING W REFLEX): HIV Screen 4th Generation wRfx: NONREACTIVE

## 2021-09-13 LAB — SEDIMENTATION RATE: Sed Rate: 5 mm/hr (ref 0–22)

## 2021-09-13 LAB — VITAMIN B12: Vitamin B-12: 176 pg/mL — ABNORMAL LOW (ref 180–914)

## 2021-09-13 NOTE — Progress Notes (Signed)
Patient did attend the evening speaker NA meeting.  

## 2021-09-13 NOTE — Group Note (Signed)
Recreation Therapy Group Note ? ? ?Group Topic:Stress Management  ?Group Date: 09/13/2021 ?Start Time: 0930 ?End Time: 6789 ?Facilitators: Caroll Rancher, LRT,CTRS ?Location: [address] Day[address] ? ? ?Goal Area(s) Addresses:  ?Patient will identify positive stress management techniques. ?Patient will identify benefits of using stress management post d/c. ? ?Group Description:  Meditation.  LRT played a meditation that focused on finding and building morning energy for the day.  Patients were to follow along and the meditation led patients to focus on their breathing and channel energy to each chakra point in the body.  ? ? ?Affect/Mood: Appropriate ?  ?Participation Level: Active ?  ?Participation Quality: Independent ?  ?Behavior: Appropriate ?  ?Speech/Thought Process: Focused ?  ?Insight: Good ?  ?Judgement: Good ?  ?Modes of Intervention: Meditation ?  ?Patient Response to Interventions:  Engaged ?  ?Education Outcome: ? Acknowledges education and In group clarification offered   ? ?Clinical Observations/Individualized Feedback: Pt attended and participated in group session.  ?  ? ?Plan: Continue to engage patient in RT group sessions 2-3x/week. ? ? ?Caroll Rancher, LRT/CTRS ?09/13/2021 12:32 PM ?

## 2021-09-13 NOTE — Progress Notes (Signed)
?   09/13/21 0500  ?Sleep  ?Number of Hours 8.75  ? ? ?

## 2021-09-13 NOTE — Group Note (Signed)
LCSW Group Therapy Note ? ? ?Group Date: 09/13/2021 ?Start Time: 1300 ?End Time: 1400 ? ? ?Type of Therapy and Topic:  Group Therapy: Boundaries ? ?Participation Level:  Did Not Attend ? ?Description of Group: ?This group will address the use of boundaries in their personal lives. Patients will explore why boundaries are important, the difference between healthy and unhealthy boundaries, and negative and postive outcomes of different boundaries and will look at how boundaries can be crossed.  Patients will be encouraged to identify current boundaries in their own lives and identify what kind of boundary is being set. Facilitators will guide patients in utilizing problem-solving interventions to address and correct types boundaries being used and to address when no boundary is being used. Understanding and applying boundaries will be explored and addressed for obtaining and maintaining a balanced life. Patients will be encouraged to explore ways to assertively make their boundaries and needs known to significant others in their lives, using other group members and facilitator for role play, support, and feedback. ? ?Therapeutic Goals: ? ?1.  Patient will identify areas in their life where setting clear boundaries could be  used to improve their life.  ?2.  Patient will identify signs/triggers that a boundary is not being respected. ?3.  Patient will identify two ways to set boundaries in order to achieve balance in  their lives: ?4.  Patient will demonstrate ability to communicate their needs and set boundaries  through discussion and/or role plays ? ?Summary of Patient Progress:  Did not attend ? ?Therapeutic Modalities:   ?Cognitive Behavioral Therapy ?Solution-Focused Therapy ? ?Rustyn Conery E Jonesha Tsuchiya, LCSW ?09/13/2021  2:03 PM   ? ?

## 2021-09-13 NOTE — Progress Notes (Signed)
D: Patient is sleeping and eating well. Patient states she has "some thoughts" of self harm. Patient also endorses A/VH. She requested haldol for her anxiety. She rates her depression and anxiety as a 3; hopelessness as a 2. Her goal today is to "get the voices to stop and talk with the doctors." Patient is observed reading a book in the hallway; she is also observed interacting with her peers. Patient is compliant with her medications. ? ?A: Continue to monitor medication management and MD orders.  Safety checks completed every 15 minutes per protocol.  Offer support and encouragement as needed. ? ?R: Patient is receptive to staff; her behavior is appropriate.   ? ? 09/13/21 1200  ?Psych Admission Type (Psych Patients Only)  ?Admission Status Voluntary  ?Psychosocial Assessment  ?Patient Complaints Anxiety  ?Eye Contact Fair  ?Facial Expression Anxious  ?Affect Anxious  ?Speech Soft  ?Interaction Assertive  ?Motor Activity Slow  ?Appearance/Hygiene Unremarkable  ?Behavior Characteristics Cooperative  ?Mood Depressed  ?Thought Process  ?Coherency WDL  ?Content WDL  ?Delusions None reported or observed  ?Perception Hallucinations  ?Hallucination Auditory;Visual  ?Judgment Impaired  ?Confusion None  ?Danger to Self  ?Current suicidal ideation? Denies  ?Danger to Others  ?Danger to Others None reported or observed  ? ? ?

## 2021-09-13 NOTE — Progress Notes (Signed)
Pt complained of AH / agitation so pt given PRN Haldol per MAR  ? ? ? 09/13/21 2100  ?Psych Admission Type (Psych Patients Only)  ?Admission Status Voluntary  ?Psychosocial Assessment  ?Patient Complaints Anxiety;Suspiciousness  ?Eye Contact Fair  ?Facial Expression Anxious  ?Affect Anxious  ?Speech Soft  ?Interaction Assertive  ?Motor Activity Slow  ?Appearance/Hygiene Unremarkable  ?Behavior Characteristics Cooperative  ?Mood Preoccupied  ?Aggressive Behavior  ?Effect No apparent injury  ?Thought Process  ?Coherency WDL  ?Content WDL  ?Delusions WDL  ?Perception Hallucinations  ?Hallucination Auditory;Visual  ?Judgment Impaired  ?Confusion None  ?Danger to Self  ?Current suicidal ideation? Denies  ?Danger to Others  ?Danger to Others None reported or observed  ? ? ?

## 2021-09-13 NOTE — [facility] Suicide Risk Assessment (Signed)
[facility] INPATIENT:  Family/Significant Other Suicide Prevention Education ? ?Suicide Prevention Education:  ?Education Completed; Sabrina Bennett [phone] (Boyfriend) has been identified by the patient as the family member/significant other with whom the patient will be residing, and identified as the person(s) who will aid the patient in the event of a mental health crisis (suicidal ideations/suicide attempt).  With written consent from the patient, the family member/significant other has been provided the following suicide prevention education, prior to the and/or following the discharge of the patient. ? ?The suicide prevention education provided includes the following: ?Suicide risk factors ?Suicide prevention and interventions ?National Suicide Hotline telephone number ?[facility] assessment telephone number ?[facility] Emergency Assistance 911 ?[place] and/or Residential Mobile Crisis Unit telephone number ? ?Request made of family/significant other to: ?Remove weapons (e.g., guns, rifles, knives), all items previously/currently identified as safety concern.   ?Remove drugs/medications (over-the-counter, prescriptions, illicit drugs), all items previously/currently identified as a safety concern. ? ?The family member/significant other verbalizes understanding of the suicide prevention education information provided.  The family member/significant other agrees to remove the items of safety concern listed above. ? ?CSW spoke with Mr. Sabrina Bennett who states that his girlfriend was having suicidal thoughts and had "created a plan".  He states that she confided in him about this information and then agreed to go to the hospital.  He states confirms that his girlfriend does live with him and that she can return home after discharge.  He states that there are no firearms or weapons in the home.  CSW completed SPE with Mr. Sabrina Bennett.  ? ?Sabrina Bennett ?09/13/2021, 11:44 AM ?

## 2021-09-13 NOTE — Progress Notes (Addendum)
[facility] MD Progress Note ? ?09/13/2021 7:15 PM ?Sabrina Bennett  ?MRN:  [id number] ?Reason for admission:Sabrina Bennett is a 21 year old female with a psychiatric history of bipolar disorder, depression, GAD, OCD, ADHD- inattentive type, ARFID, and binge-eating disorder, as well as a medical history of hypothyroidism and POTS who presented to [facility] as a walk-in and admitted voluntarily for worsening AVH with command hallucinations telling her to kill herself as well as passive SI.  Patient was last admitted to [facility] in December 2022. ?  ?Of note, patient went to [facility] on 5/6 for SI, was recommended for observation, and left prior to completing.  She went home, and on 5/7, had worsening of command AH so presented as walk-in to [facility]. ? ?Subjective: Overnight, the patient reported an increase in the volume of her auditory hallucinations with commands telling her to hit the wall, which she did not follow, so she was given an additional 5 mg Abilify (daily total 20 mg). A couple of hours after, she was observed hitting the wall x 3. Abilify was switched to Geodon 40 mg, which patient began yesterday evening. ? ?On assessment today, the patient reports an "okay" mood, good sleep, and intact appetite. Her AH were increased in volume today, commanding her to hit the floor. Patient reports punching the floor several times with reddened, swollen, painful knuckles. The voices also told her not to take the PRN Haldol, as something bad would happen. She denies somatic symptoms.  She denies SI/HI and paranoia. In addition to [facility], she reports VH of shadows. ? ?Principal Problem: Schizoaffective disorder, bipolar type (HCC) ?Diagnosis: Principal Problem: ?  Schizoaffective disorder, bipolar type (HCC) ?Active Problems: ?  POTS (postural orthostatic tachycardia syndrome) ?  Subclinical hypothyroidism ?  PTSD (post-traumatic stress disorder) ?  History of OCD (obsessive compulsive disorder) ? ? ?Total Time spent with patient: 20 minutes ? ?Past  Psychiatric History: See H&P ? ?Past Medical History:  ?History reviewed. No pertinent past medical history.  ?History reviewed. No pertinent surgical history. ?Family History: History reviewed. No pertinent family history. ?Family Psychiatric  History: See H&P ?Social History:  ?Social History  ? ?Substance and Sexual Activity  ?Alcohol Use Yes  ?   ?Social History  ? ?Substance and Sexual Activity  ?Drug Use Never  ?  ?Social History  ? ?Socioeconomic History  ? Marital status: Single  ?  Spouse name: Not on file  ? Number of children: Not on file  ? Years of education: Not on file  ? Highest education level: Not on file  ?Occupational History  ? Not on file  ?Tobacco Use  ? Smoking status: Never  ?  Passive exposure: Never  ? Smokeless tobacco: Never  ?Vaping Use  ? Vaping Use: Never used  ?Substance and Sexual Activity  ? Alcohol use: Yes  ? Drug use: Never  ? Sexual activity: Yes  ?  Birth control/protection: Pill, Condom  ?Other Topics Concern  ? Not on file  ?Social History Narrative  ? Not on file  ? ?Social Determinants of Health  ? ?Financial Resource Strain: Not on file  ?Food Insecurity: Not on file  ?Transportation Needs: Not on file  ?Physical Activity: Not on file  ?Stress: Not on file  ?Social Connections: Not on file  ? ?Additional Social History:  ?  ?  ? ?Sleep: Good ? ?Appetite:  Good ? ?Current Medications: ?Current Facility-Administered Medications  ?Medication Dose Route Frequency Provider Last Rate Last Admin  ? haloperidol (HALDOL) tablet 5  mg  5 mg Oral Q6H PRN Lamar Sprinkles, MD   5 mg at 09/13/21 1107  ? Or  ? haloperidol lactate (HALDOL) injection 5 mg  5 mg Intramuscular Q6H PRN Lamar Sprinkles, MD      ? hydrOXYzine (ATARAX) tablet 25 mg  25 mg Oral TID PRN Bobbitt, Shalon E, NP   25 mg at 09/12/21 2112  ? levothyroxine (SYNTHROID) tablet 75 mcg  75 mcg Oral Q0600 Bobbitt, Shalon E, NP   75 mcg at 09/13/21 0818  ? metFORMIN (GLUCOPHAGE) tablet 500 mg  500 mg Oral Q breakfast Bobbitt,  Shalon E, NP   500 mg at 09/13/21 0819  ? midodrine (PROAMATINE) tablet 2.5 mg  2.5 mg Oral BID WC Lamar Sprinkles, MD   2.5 mg at 09/13/21 1620  ? ramelteon (ROZEREM) tablet 8 mg  8 mg Oral QHS Lamar Sprinkles, MD   8 mg at 09/12/21 2112  ? sertraline (ZOLOFT) tablet 50 mg  50 mg Oral Daily Lamar Sprinkles, MD   50 mg at 09/13/21 0102  ? topiramate (TOPAMAX) tablet 25 mg  25 mg Oral BID Bobbitt, Shalon E, NP   25 mg at 09/13/21 1620  ? ziprasidone (GEODON) capsule 40 mg  40 mg Oral BID WC Lamar Sprinkles, MD   40 mg at 09/13/21 1620  ? ? ?Lab Results:  ?Results for orders placed or performed during the hospital encounter of 09/10/21 (from the past 48 hour(s))  ?GC/Chlamydia probe amp ([place])not at [facility]     Status: None  ? Collection Time: 09/12/21  5:35 PM  ?Result Value Ref Range  ? Chlamydia Negative   ? Neisseria Gonorrhea Negative   ? Comment Normal Reference Ranger Chlamydia - Negative   ? Comment    ?  Normal Reference Range Neisseria Gonorrhea - Negative  ?Rapid urine drug screen (hospital performed) not at [facility]     Status: None  ? Collection Time: 09/12/21  6:31 PM  ?Result Value Ref Range  ? Opiates NONE DETECTED NONE DETECTED  ? Cocaine NONE DETECTED NONE DETECTED  ? Benzodiazepines NONE DETECTED NONE DETECTED  ? Amphetamines NONE DETECTED NONE DETECTED  ? Tetrahydrocannabinol NONE DETECTED NONE DETECTED  ? Barbiturates NONE DETECTED NONE DETECTED  ?  Comment: (NOTE) ?DRUG SCREEN FOR MEDICAL PURPOSES ?ONLY.  IF CONFIRMATION IS NEEDED ?FOR ANY PURPOSE, NOTIFY LAB ?WITHIN 5 DAYS. ? ?LOWEST DETECTABLE LIMITS ?FOR URINE DRUG SCREEN ?Drug Class                     Cutoff (ng/mL) ?Amphetamine and metabolites    1000 ?Barbiturate and metabolites    200 ?Benzodiazepine                 200 ?Tricyclics and metabolites     300 ?Opiates and metabolites        300 ?Cocaine and metabolites        300 ?THC                            50 ?Performed at [facility], [address] ?[place], [place] [address] ?  ?Urinalysis, Complete w Microscopic     Status: Abnormal  ? Collection Time: 09/12/21  6:31 PM  ?Result Value Ref Range  ? Color, Urine YELLOW YELLOW  ? APPearance CLEAR CLEAR  ? Specific Gravity, Urine 1.026 1.005 - 1.030  ? pH 6.0 5.0 - 8.0  ? Glucose, UA  NEGATIVE NEGATIVE mg/dL  ? Hgb urine dipstick NEGATIVE NEGATIVE  ? Bilirubin Urine NEGATIVE NEGATIVE  ? Ketones, ur NEGATIVE NEGATIVE mg/dL  ? Protein, ur 30 (A) NEGATIVE mg/dL  ? Nitrite NEGATIVE NEGATIVE  ? Leukocytes,Ua TRACE (A) NEGATIVE  ? RBC / HPF 0-5 0 - 5 RBC/hpf  ? WBC, UA 0-5 0 - 5 WBC/hpf  ? Bacteria, UA NONE SEEN NONE SEEN  ? Squamous Epithelial / LPF 0-5 0 - 5  ? Mucus PRESENT   ?  Comment: Performed at [facility], [address] [place], [place] [address]  ?Basic metabolic panel     Status: Abnormal  ? Collection Time: 09/13/21  6:28 AM  ?Result Value Ref Range  ? Sodium 140 135 - 145 mmol/L  ? Potassium 3.9 3.5 - 5.1 mmol/L  ? Chloride 113 (H) 98 - 111 mmol/L  ? CO2 22 22 - 32 mmol/L  ? Glucose, Bld 103 (H) 70 - 99 mg/dL  ?  Comment: Glucose reference range applies only to samples taken after fasting for at least 8 hours.  ? BUN 13 6 - 20 mg/dL  ? Creatinine, Ser 1.06 (H) 0.44 - 1.00 mg/dL  ? Calcium 8.8 (L) 8.9 - 10.3 mg/dL  ? GFR, Estimated >60 >60 mL/min  ?  Comment: (NOTE) ?Calculated using the CKD-EPI Creatinine Equation (2021) ?  ? Anion gap 5 5 - 15  ?  Comment: Performed at [facility], [address] [place], [place] [address]  ?HIV Antibody (routine testing w rflx)     Status: None  ? Collection Time: 09/13/21  6:28 AM  ?Result Value Ref Range  ? HIV Screen 4th Generation wRfx Non Reactive Non Reactive  ?  Comment: Performed at [facility], [address] [place], [place] [address]  ?RPR     Status: None  ? Collection Time: 09/13/21  6:28 AM  ?Result Value Ref Range  ? RPR Ser Ql NON REACTIVE NON REACTIVE  ?  Comment: Performed at [facility],  [address] [place], [place] [address]  ?Vitamin B12     Status: Abnormal  ? Collection Time: 09/13/21  6:28 AM  ?Result Value Ref Range  ? Vitamin B-12 176 (L) 180 - 914 pg/mL  ?  Comment: (NOTE) ?This assay is not validated

## 2021-09-13 NOTE — [facility] Suicide Risk Assessment (Deleted)
[facility] INPATIENT:  Family/Significant Other Suicide Prevention Education ? ?Suicide Prevention Education:  ?Contact Attempts:[name] [phone] (Boyfriend) has been identified by the patient as the family member/significant other with whom the patient will be residing, and identified as the person(s) who will aid the patient in the event of a mental health crisis.  With written consent from the patient, two attempts were made to provide suicide prevention education, prior to and/or following the patient's discharge.  We were unsuccessful in providing suicide prevention education.  A suicide education pamphlet was given to the patient to share with family/significant other. ? ?Date and time of first attempt: 09/12/2021 at 11:09am  ?Date and time of second attempt: 09/13/2021 at 9:51am  ? ?Sabrina Bennett ?09/13/2021, 10:10 AM ?

## 2021-09-14 LAB — ANA W/REFLEX IF POSITIVE: Anti Nuclear Antibody (ANA): NEGATIVE

## 2021-09-14 NOTE — Progress Notes (Signed)
[facility] MD Progress Note ? ?09/14/2021 9:36 AM ?Sabrina Bennett  ?MRN:  [id number] ? ?Subjective: Sabrina Bennett reports, "I'm feeling good, content & happy. The voices are there, but quiet today". ? ?Reason for admission:Sabrina Bennett is a 21 year old female with a psychiatric history of bipolar disorder, depression, GAD, OCD, ADHD- inattentive type, ARFID, and binge-eating disorder, as well as a medical history of hypothyroidism and POTS who presented to [facility] as a walk-in and admitted voluntarily for worsening AVH with command hallucinations telling her to kill herself as well as passive SI.  Patient was last admitted to [facility] in December 2022. Of note, patient went to [facility] on 5/6 for SI, was recommended for observation, and left prior to completing.  She went home, and on 5/7, had worsening of command AH so presented as walk-in to [facility]. ? ?Daily notes: [name] is seen, chart reviewed. The chart findings discussed with the treatment team. She is seen in her room, sitting up on her bed. She presents with a good affect, good eye contact & verbally responsive. She was eating pop corn. She says she is doing well, content & happy today. She says the voices are still there but a lot quieter & unable to make out what they were says. She says she is taking & tolerating her treatment regimen, denies any side effects. She currently denies any SIHI, VH, delusional thoughts or paranoia. She does not appear to be responding to any internal stimuli. Reviewed current lab results, creat is trending down 1.06, Chloride slightly elevated 113, Vitamin B-12 176 (low). No new labs ordered. Vital signs; blood pressure 111/71, Pulse 119. Patient is currently in no apparent distress. ? ?Principal Problem: Schizoaffective disorder, bipolar type (HCC) ?Diagnosis: Principal Problem: ?  Schizoaffective disorder, bipolar type (HCC) ?Active Problems: ?  POTS (postural orthostatic tachycardia syndrome) ?  Subclinical hypothyroidism ?  PTSD (post-traumatic stress  disorder) ?  History of OCD (obsessive compulsive disorder) ? ? ?Total Time spent with patient: 20 minutes ? ?Past Psychiatric History: See H&P ? ?Past Medical History:  ?History reviewed. No pertinent past medical history.  ?History reviewed. No pertinent surgical history. ?Family History: History reviewed. No pertinent family history. ? ?Family Psychiatric  History: See H&P ? ?Social History:  ?Social History  ? ?Substance and Sexual Activity  ?Alcohol Use Yes  ?   ?Social History  ? ?Substance and Sexual Activity  ?Drug Use Never  ?  ?Social History  ? ?Socioeconomic History  ? Marital status: Single  ?  Spouse name: Not on file  ? Number of children: Not on file  ? Years of education: Not on file  ? Highest education level: Not on file  ?Occupational History  ? Not on file  ?Tobacco Use  ? Smoking status: Never  ?  Passive exposure: Never  ? Smokeless tobacco: Never  ?Vaping Use  ? Vaping Use: Never used  ?Substance and Sexual Activity  ? Alcohol use: Yes  ? Drug use: Never  ? Sexual activity: Yes  ?  Birth control/protection: Pill, Condom  ?Other Topics Concern  ? Not on file  ?Social History Narrative  ? Not on file  ? ?Social Determinants of Health  ? ?Financial Resource Strain: Not on file  ?Food Insecurity: Not on file  ?Transportation Needs: Not on file  ?Physical Activity: Not on file  ?Stress: Not on file  ?Social Connections: Not on file  ? ?Additional Social History:  ? ?Sleep: Good ? ?Appetite:  Good ? ?Current Medications: ?Current Facility-Administered  Medications  ?Medication Dose Route Frequency Provider Last Rate Last Admin  ? haloperidol (HALDOL) tablet 5 mg  5 mg Oral Q6H PRN Lamar Sprinklesosby, Courtney, MD   5 mg at 09/13/21 2031  ? Or  ? haloperidol lactate (HALDOL) injection 5 mg  5 mg Intramuscular Q6H PRN Lamar Sprinklesosby, Courtney, MD      ? hydrOXYzine (ATARAX) tablet 25 mg  25 mg Oral TID PRN Bobbitt, Shalon E, NP   25 mg at 09/13/21 2117  ? levothyroxine (SYNTHROID) tablet 75 mcg  75 mcg Oral Q0600 Bobbitt,  Shalon E, NP   75 mcg at 09/14/21 0631  ? metFORMIN (GLUCOPHAGE) tablet 500 mg  500 mg Oral Q breakfast Bobbitt, Shalon E, NP   500 mg at 09/14/21 0810  ? midodrine (PROAMATINE) tablet 2.5 mg  2.5 mg Oral BID WC Lamar Sprinklesosby, Courtney, MD   2.5 mg at 09/14/21 0810  ? ramelteon (ROZEREM) tablet 8 mg  8 mg Oral QHS Lamar Sprinklesosby, Courtney, MD   8 mg at 09/13/21 2117  ? sertraline (ZOLOFT) tablet 50 mg  50 mg Oral Daily Lamar Sprinklesosby, Courtney, MD   50 mg at 09/14/21 0810  ? topiramate (TOPAMAX) tablet 25 mg  25 mg Oral BID Bobbitt, Shalon E, NP   25 mg at 09/14/21 0810  ? ziprasidone (GEODON) capsule 40 mg  40 mg Oral BID WC Lamar Sprinklesosby, Courtney, MD   40 mg at 09/14/21 0810  ? ? ?Lab Results:  ?Results for orders placed or performed during the hospital encounter of 09/10/21 (from the past 48 hour(s))  ?GC/Chlamydia probe amp ([place])not at [facility]     Status: None  ? Collection Time: 09/12/21  5:35 PM  ?Result Value Ref Range  ? Chlamydia Negative   ? Neisseria Gonorrhea Negative   ? Comment Normal Reference Ranger Chlamydia - Negative   ? Comment    ?  Normal Reference Range Neisseria Gonorrhea - Negative  ?Rapid urine drug screen (hospital performed) not at [facility]     Status: None  ? Collection Time: 09/12/21  6:31 PM  ?Result Value Ref Range  ? Opiates NONE DETECTED NONE DETECTED  ? Cocaine NONE DETECTED NONE DETECTED  ? Benzodiazepines NONE DETECTED NONE DETECTED  ? Amphetamines NONE DETECTED NONE DETECTED  ? Tetrahydrocannabinol NONE DETECTED NONE DETECTED  ? Barbiturates NONE DETECTED NONE DETECTED  ?  Comment: (NOTE) ?DRUG SCREEN FOR MEDICAL PURPOSES ?ONLY.  IF CONFIRMATION IS NEEDED ?FOR ANY PURPOSE, NOTIFY LAB ?WITHIN 5 DAYS. ? ?LOWEST DETECTABLE LIMITS ?FOR URINE DRUG SCREEN ?Drug Class                     Cutoff (ng/mL) ?Amphetamine and metabolites    1000 ?Barbiturate and metabolites    200 ?Benzodiazepine                 200 ?Tricyclics and metabolites     300 ?Opiates and metabolites        300 ?Cocaine and metabolites         300 ?THC                            50 ?Performed at [facility], [address] ?[place], [place] [address] ?  ?Urinalysis, Complete w Microscopic     Status: Abnormal  ? Collection Time: 09/12/21  6:31 PM  ?Result Value Ref Range  ? Color, Urine YELLOW YELLOW  ? APPearance CLEAR CLEAR  ? Specific  Gravity, Urine 1.026 1.005 - 1.030  ? pH 6.0 5.0 - 8.0  ? Glucose, UA NEGATIVE NEGATIVE mg/dL  ? Hgb urine dipstick NEGATIVE NEGATIVE  ? Bilirubin Urine NEGATIVE NEGATIVE  ? Ketones, ur NEGATIVE NEGATIVE mg/dL  ? Protein, ur 30 (A) NEGATIVE mg/dL  ? Nitrite NEGATIVE NEGATIVE  ? Leukocytes,Ua TRACE (A) NEGATIVE  ? RBC / HPF 0-5 0 - 5 RBC/hpf  ? WBC, UA 0-5 0 - 5 WBC/hpf  ? Bacteria, UA NONE SEEN NONE SEEN  ? Squamous Epithelial / LPF 0-5 0 - 5  ? Mucus PRESENT   ?  Comment: Performed at [facility], [address] [place], [place] [address]  ?Basic metabolic panel     Status: Abnormal  ? Collection Time: 09/13/21  6:28 AM  ?Result Value Ref Range  ? Sodium 140 135 - 145 mmol/L  ? Potassium 3.9 3.5 - 5.1 mmol/L  ? Chloride 113 (H) 98 - 111 mmol/L  ? CO2 22 22 - 32 mmol/L  ? Glucose, Bld 103 (H) 70 - 99 mg/dL  ?  Comment: Glucose reference range applies only to samples taken after fasting for at least 8 hours.  ? BUN 13 6 - 20 mg/dL  ? Creatinine, Ser 1.06 (H) 0.44 - 1.00 mg/dL  ? Calcium 8.8 (L) 8.9 - 10.3 mg/dL  ? GFR, Estimated >60 >60 mL/min  ?  Comment: (NOTE) ?Calculated using the CKD-EPI Creatinine Equation (2021) ?  ? Anion gap 5 5 - 15  ?  Comment: Performed at [facility], [address] [place], [place] [address]  ?HIV Antibody (routine testing w rflx)     Status: None  ? Collection Time: 09/13/21  6:28 AM  ?Result Value Ref Range  ? HIV Screen 4th Generation wRfx Non Reactive Non Reactive  ?  Comment: Performed at [facility], [address], [place], [place] [address]  ?RPR     Status: None  ? Collection Time: 09/13/21  6:28 AM  ?Result Value  Ref Range  ? RPR Ser Ql NON REACTIVE NON REACTIVE  ?  Comment: Performed at [facility], [address], [place], [place] [address]  ?Vitamin B12     Status: Abnormal  ? Collection Time: 09/13/21  6:28

## 2021-09-14 NOTE — Progress Notes (Signed)
?   09/14/21 1000  ?Psych Admission Type (Psych Patients Only)  ?Admission Status Voluntary  ?Psychosocial Assessment  ?Patient Complaints None  ?Eye Contact Fair  ?Facial Expression Anxious  ?Affect Anxious  ?Speech Soft  ?Interaction Assertive  ?Motor Activity Slow  ?Appearance/Hygiene Unremarkable  ?Behavior Characteristics Cooperative  ?Mood Preoccupied  ?Aggressive Behavior  ?Effect No apparent injury  ?Thought Process  ?Coherency WDL  ?Content WDL  ?Delusions WDL  ?Perception UTA  ?Hallucination None reported or observed  ?Judgment Impaired  ?Confusion None  ?Danger to Self  ?Current suicidal ideation? Denies  ?Danger to Others  ?Danger to Others None reported or observed  ? ? ?

## 2021-09-14 NOTE — Progress Notes (Signed)
[facility] Notes:  (Nursing/MHT/Case Management/Adjunct) ? ?Date:  09/14/2021  ?Time:  2015 ? ?Type of Therapy:   wrap up group ? ?Participation Level:  Active ? ?Participation Quality:  Appropriate, Attentive, Sharing, and Supportive ? ?Affect:  Flat ? ?Cognitive:  Alert ? ?Insight:  Improving ? ?Engagement in Group:  Engaged ? ?Modes of Intervention:  Clarification, Education, and Support ? ?Summary of Progress/Problems: Positive thinking and positive change were discussed.  ? ?[name] ?09/14/2021, 8:55 PM ?

## 2021-09-14 NOTE — Progress Notes (Signed)
?   09/14/21 0500  ?Sleep  ?Number of Hours 6.75  ? ? ?

## 2021-09-14 NOTE — Progress Notes (Signed)
Pt stated she was feeling better AVH decreasing ? ? ? 09/14/21 2000  ?Psych Admission Type (Psych Patients Only)  ?Admission Status Voluntary  ?Psychosocial Assessment  ?Patient Complaints None  ?Eye Contact Fair  ?Facial Expression Anxious  ?Affect Anxious  ?Speech Soft  ?Interaction Assertive  ?Motor Activity Slow  ?Appearance/Hygiene Unremarkable  ?Behavior Characteristics Cooperative  ?Mood Preoccupied  ?Aggressive Behavior  ?Effect No apparent injury  ?Thought Process  ?Coherency WDL  ?Content WDL  ?Delusions WDL  ?Perception Hallucinations  ?Hallucination Auditory;Visual  ?Judgment Impaired  ?Confusion None  ?Danger to Self  ?Current suicidal ideation? Denies  ?Danger to Others  ?Danger to Others None reported or observed  ? ? ?

## 2021-09-15 ENCOUNTER — Encounter (HOSPITAL_COMMUNITY): Payer: Self-pay

## 2021-09-15 MED ORDER — VITAMIN B-12 100 MCG PO TABS
50.0000 ug | ORAL_TABLET | Freq: Every day | ORAL | Status: DC
  Administered 2021-09-15 – 2021-09-18 (×4): 50 ug via ORAL
  Filled 2021-09-15 (×6): qty 1

## 2021-09-15 NOTE — Progress Notes (Addendum)
[facility] MD Progress Note ? ?09/15/2021 12:30 PM ?Sabrina Bennett  ?MRN:  [id number] ?Principal Problem: Schizoaffective disorder, bipolar type ([place]) ?Diagnosis: Principal Problem: ?  Schizoaffective disorder, bipolar type ([place]) ?Active Problems: ?  POTS (postural orthostatic tachycardia syndrome) ?  Subclinical hypothyroidism ?  PTSD (post-traumatic stress disorder) ?  History of OCD (obsessive compulsive disorder) ? ? ?Reason for Admission: Anastashia is a 21 year old female with a psychiatric history of bipolar disorder, depression, GAD, OCD, ADHD- inattentive type, ARFID, and binge-eating disorder, as well as a medical history of hypothyroidism and POTS who presented to [facility] as a walk-in and admitted voluntarily for worsening AVH with command hallucinations telling her to kill herself as well as passive SI. This is hospitalization day 5. ? ?Yesterday's Psychiatry Team's Recommendation: ?- Continue Geodon 40 mg BID given worsening psychosis.  ?- Continue Zoloft 50 mg daily for depression ?- Continue Ramelteon 8 mg Q hs for insomnia. ?- Continue Metformin 500 mg daily for antipsychotic induced metabolic effects. ?- Continue topiramate 25 mg twice daily for history of ARFID ? ?Subjective: Patient was seen and assessed at day room today.  Patient denies any acute concerns and feels that her mood has been improving since yesterday.  Patient does report that she had an episode where she "blacked out" last night and woke up on the floor and her right fist hurting which she suspects is due to her punching the floor.  Patient reports that this activity has been occurring for the past week and that she does think that it is happening less than before.  Patient also had an incident where she woke up last night with chest pain and mild tachypnea.  This episode was very similar to previous panic attacks that she has had.  Patient reports that she was able to go back to sleep and denies any further chest pain at this time.  Patient  reports eating well and sleeping well.  Patient denies SI/HI.  Patient denies having any auditory/command or visual hallucinations today.  Patient denies any delusions. Patient wanted to discuss discharge planning and reports that she feels that she will be ready for discharge by Monday.  Patient also reports interest in partial hospitalization program as well as setting up with new psychiatrist and therapist that can be in person in [place] as her psychiatric care is currently in [place].  All questions were appropriately addressed.  Discussed labs with patient: ANA, ESR, vitamin B12, RPR, HIV, BMP.  Discussed adding of B12 supplement for low B12 level which patient was amenable to starting.  ? ?Objective:  ?Chart Review ?Past 24 hours of patient's chart was reviewed.  ?Patient is compliant with scheduled meds. ?Required Agitation PRNs: none ?Per RN notes, no documented behavioral issues and is attending group. ?Patient slept, 7 hours ? ?Total Time spent with patient: 45 minutes ? ?Past Psychiatric History:  ?Previous Psych Diagnoses:  bipolar disorder, depression, GAD, OCD, ADHD- inattentive type, ARFID, and binge-eating disorder ?Prior inpatient treatment: Denies ?Current/prior outpatient treatment/psychotherapy: Yes ?Prior rehab hx: Denies ?History of suicide: Denies attempt; history of cutting ?History of homicide: Denies ?Psychiatric medication history: Lamictal 100 mg, Zoloft 200 mg, Zyprexa 2.5 mg (14 lb weight gain), Vyvanse, Vraylar 6 mg (since 07/2020), Lithium 300 mg ?Psychiatric medication compliance history: Compliant ?Neuromodulation history: Denies ?Current Psychiatrist and therapist:John Arnell Bennett (last seen last month when hydroxyzine was discontinued and discussion to discontinue topiramate) and therapist Alvira Philips (last seen November 2022) through [facility] in [place] ? ?Family  Psychiatric  History:  ?Psych: Mom and younger brother with ADHD; older brother with  depression/anxiety ?Psych Rx: Unknown ?SA/HA: Denies ?Substance use family hx: Dad- alcohol use disorder ? ?Social History:  ?Social History  ? ?Substance and Sexual Activity  ?Alcohol Use Yes  ?   ?Social History  ? ?Substance and Sexual Activity  ?Drug Use Never  ?  ?Social History  ? ?Socioeconomic History  ? Marital status: Single  ?  Spouse name: Not on file  ? Number of children: Not on file  ? Years of education: Not on file  ? Highest education level: Not on file  ?Occupational History  ? Not on file  ?Tobacco Use  ? Smoking status: Never  ?  Passive exposure: Never  ? Smokeless tobacco: Never  ?Vaping Use  ? Vaping Use: Never used  ?Substance and Sexual Activity  ? Alcohol use: Yes  ? Drug use: Never  ? Sexual activity: Yes  ?  Birth control/protection: Pill, Condom  ?Other Topics Concern  ? Not on file  ?Social History Narrative  ? Not on file  ? ?Social Determinants of Health  ? ?Financial Resource Strain: Not on file  ?Food Insecurity: Not on file  ?Transportation Needs: Not on file  ?Physical Activity: Not on file  ?Stress: Not on file  ?Social Connections: Not on file  ? ?Additional Social History:  ?  ?  ?  ?  ?  ?  ?  ?  ?  ?  ?  ? ?Current Medications: ?Current Facility-Administered Medications  ?Medication Dose Route Frequency Provider Last Rate Last Admin  ? haloperidol (HALDOL) tablet 5 mg  5 mg Oral Q6H PRN Rosezetta Schlatter, MD   5 mg at 09/13/21 2031  ? Or  ? haloperidol lactate (HALDOL) injection 5 mg  5 mg Intramuscular Q6H PRN Rosezetta Schlatter, MD      ? hydrOXYzine (ATARAX) tablet 25 mg  25 mg Oral TID PRN Bobbitt, Shalon E, NP   25 mg at 09/14/21 2110  ? levothyroxine (SYNTHROID) tablet 75 mcg  75 mcg Oral Q0600 Bobbitt, Shalon E, NP   75 mcg at 09/15/21 2202  ? metFORMIN (GLUCOPHAGE) tablet 500 mg  500 mg Oral Q breakfast Bobbitt, Shalon E, NP   500 mg at 09/15/21 0736  ? midodrine (PROAMATINE) tablet 2.5 mg  2.5 mg Oral BID WC Rosezetta Schlatter, MD   2.5 mg at 09/15/21 0737  ? ramelteon  (ROZEREM) tablet 8 mg  8 mg Oral QHS Rosezetta Schlatter, MD   8 mg at 09/14/21 2110  ? sertraline (ZOLOFT) tablet 50 mg  50 mg Oral Daily Rosezetta Schlatter, MD   50 mg at 09/15/21 0737  ? topiramate (TOPAMAX) tablet 25 mg  25 mg Oral BID Bobbitt, Shalon E, NP   25 mg at 09/15/21 0737  ? vitamin B-12 (CYANOCOBALAMIN) tablet 50 mcg  50 mcg Oral Daily France Ravens, MD      ? ziprasidone (GEODON) capsule 40 mg  40 mg Oral BID WC Rosezetta Schlatter, MD   40 mg at 09/15/21 0737  ? ? ?Lab Results:  ?No results found for this or any previous visit (from the past 24 hour(s)). ? ?Blood Alcohol level:  ?No results found for: [facility] ? ?Metabolic Disorder Labs: ?Lab Results  ?Component Value Date  ? HGBA1C 5.3 09/11/2021  ? MPG 105.41 09/11/2021  ? MPG 96.8 05/03/2021  ? ?No results found for: PROLACTIN ?Lab Results  ?Component Value Date  ? CHOL 162 09/11/2021  ?  TRIG 53 09/11/2021  ? HDL 66 09/11/2021  ? CHOLHDL 2.5 09/11/2021  ? VLDL 11 09/11/2021  ? [place] 85 09/11/2021  ? [place] 84 05/03/2021  ? ? ?Physical Findings: ?Musculoskeletal: ?Strength & Muscle Tone: within normal limits ?Gait & Station: normal ? ?Psychiatric Specialty Exam: ? ?Presentation  ?General Appearance: Appropriate for Environment; Casual ? ? ?Eye Contact:Good ? ? ?Speech:Clear and Coherent; Normal Rate ? ? ?Speech Volume:Normal ? ? ?Handedness:No data recorded ? ? ?Mood and Affect  ?Mood:Euthymic ? ? ?Affect:Blunt ? ? ? ?Thought Process  ?Thought Processes:Coherent; Goal Directed; Linear ? ? ?Descriptions of Associations:Intact ? ? ?Orientation:Full (Time, Place and Person) ? ? ?Thought Content:Logical ? ? ?History of Schizophrenia/Schizoaffective disorder:No ? ? ?Duration of Psychotic Symptoms:No data recorded ? ?Hallucinations:Hallucinations: None ? ?Ideas of Reference:None ? ? ?Suicidal Thoughts:Suicidal Thoughts: No ? ?Homicidal Thoughts:Homicidal Thoughts: No ? ? ?Sensorium  ?Memory:Immediate Good; Recent Good; Remote  Good ? ? ?Judgment:Fair ? ? ?Insight:Fair ? ? ? ?Executive Functions  ?Concentration:Good ? ? ?Attention Span:Good ? ? ?Recall:Good ? ? ?[facility] [employer] [place] ? ? ?Language:Good ? ? ? ?Psychomotor Activity  ?Psychomotor Activity:Psychomotor Activity:

## 2021-09-15 NOTE — Progress Notes (Signed)
?   09/15/21 0500  ?Sleep  ?Number of Hours 7  ? ? ?

## 2021-09-15 NOTE — Progress Notes (Signed)
Pt came to staff stating she felt her heart was beating fast, pt appeared to possibly having increased anxiety, vitals were taken pt Bp 120/82 HR 95 . Pt informed to come to staff if she starts to feel like that again . After taking Bp pt stated she felt fine and had no issues. Will continue to monitor. Pt educated on anxiety and breathing techniques ?

## 2021-09-15 NOTE — Group Note (Signed)
Recreation Therapy Group Note ? ? ?Group Topic:Stress Management  ?Group Date: 09/15/2021 ?Start Time: 0932 ?End Time: 0950 ?Facilitators: Caroll Rancher, LRT,CTRS ?Location: [address] Dayroom ? ? ?Goal Area(s) Addresses:  ?Patient will actively participate in stress management techniques presented during session.  ?Patient will successfully identify benefit of practicing stress management post d/c.  ?  ?Group Description:  Guided Imagery. LRT provided education, instruction, and demonstration on practice of visualization via guided imagery. Patient was asked to participate in the technique introduced during session. LRT debriefed including topics of mindfulness, stress management and specific scenarios each patient could use these techniques. Patients were given suggestions of ways to access scripts post d/c and encouraged to explore Youtube and other apps available on smartphones, tablets, and computers. ? ? ?Affect/Mood: Appropriate ?  ?Participation Level: Engaged ?  ?Participation Quality: Independent ?  ?Behavior: Appropriate ?  ?Speech/Thought Process: Focused ?  ?Insight: Good ?  ?Judgement: Good ?  ?Modes of Intervention: Script, Ashby Dawes Sounds ?  ?Patient Response to Interventions:  Engaged ?  ?Education Outcome: ? Acknowledges education and In group clarification offered   ? ?Clinical Observations/Individualized Feedback: Pt actively participated in activity.  Pt expressed no concerns at conclusion of group.  ? ? ?Plan: Continue to engage patient in RT group sessions 2-3x/week. ? ? ?Caroll Rancher, LRT,CTRS ?09/15/2021 11:34 AM ?

## 2021-09-15 NOTE — Plan of Care (Signed)
Nurse discussed anxiety, depression and coping skills with patient.  

## 2021-09-15 NOTE — BH IP Treatment Plan (Signed)
Interdisciplinary Treatment and Diagnostic Plan Update ? ?09/15/2021 ?Time of Session: Did not attend- update ?Sabrina Bennett ?MRN: [id number] ? ?Principal Diagnosis: Schizoaffective disorder, bipolar type (HCC) ? ?Secondary Diagnoses: Principal Problem: ?  Schizoaffective disorder, bipolar type (HCC) ?Active Problems: ?  POTS (postural orthostatic tachycardia syndrome) ?  Subclinical hypothyroidism ?  PTSD (post-traumatic stress disorder) ?  History of OCD (obsessive compulsive disorder) ? ? ?Current Medications:  ?Current Facility-Administered Medications  ?Medication Dose Route Frequency Provider Last Rate Last Admin  ? haloperidol (HALDOL) tablet 5 mg  5 mg Oral Q6H PRN Lamar Sprinklesosby, Courtney, MD   5 mg at 09/13/21 2031  ? Or  ? haloperidol lactate (HALDOL) injection 5 mg  5 mg Intramuscular Q6H PRN Lamar Sprinklesosby, Courtney, MD      ? hydrOXYzine (ATARAX) tablet 25 mg  25 mg Oral TID PRN Bobbitt, Shalon E, NP   25 mg at 09/14/21 2110  ? levothyroxine (SYNTHROID) tablet 75 mcg  75 mcg Oral Q0600 Bobbitt, Shalon E, NP   75 mcg at 09/15/21 [id number]  ? metFORMIN (GLUCOPHAGE) tablet 500 mg  500 mg Oral Q breakfast Bobbitt, Shalon E, NP   500 mg at 09/15/21 0736  ? midodrine (PROAMATINE) tablet 2.5 mg  2.5 mg Oral BID WC Lamar Sprinklesosby, Courtney, MD   2.5 mg at 09/15/21 0737  ? ramelteon (ROZEREM) tablet 8 mg  8 mg Oral QHS Lamar Sprinklesosby, Courtney, MD   8 mg at 09/14/21 2110  ? sertraline (ZOLOFT) tablet 50 mg  50 mg Oral Daily Lamar Sprinklesosby, Courtney, MD   50 mg at 09/15/21 0737  ? topiramate (TOPAMAX) tablet 25 mg  25 mg Oral BID Bobbitt, Shalon E, NP   25 mg at 09/15/21 0737  ? ziprasidone (GEODON) capsule 40 mg  40 mg Oral BID WC Lamar Sprinklesosby, Courtney, MD   40 mg at 09/15/21 0737  ? ?PTA Medications: ?Medications Prior to Admission  ?Medication Sig Dispense Refill Last Dose  ? cyclobenzaprine (FLEXERIL) 10 MG tablet Take 1 tablet (10 mg total) by mouth at bedtime for 14 days. 14 tablet 0   ? FLUoxetine (PROZAC) 20 MG capsule Take 1 capsule (20 mg total) by mouth  daily. 30 capsule 0   ? levothyroxine (SYNTHROID) 75 MCG tablet Take 1 tablet (75 mcg total) by mouth daily. 30 tablet 0   ? lithium carbonate (ESKALITH) 450 MG CR tablet Take 1 tablet (450 mg total) by mouth 2 (two) times daily. 60 tablet 0   ? metFORMIN (GLUCOPHAGE) 500 MG tablet Take 1 tablet (500 mg total) by mouth daily with breakfast. 30 tablet 0   ? midodrine (PROAMATINE) 2.5 MG tablet Take 2.5 mg by mouth 2 (two) times daily.     ? OLANZapine (ZYPREXA) 5 MG tablet Take 1 tablet (5 mg total) by mouth at bedtime. 30 tablet 0   ? QELBREE 200 MG 24 hr capsule Take 200 mg by mouth every morning.     ? SIMPESSE 0.15-0.03 &0.01 MG tablet Take 1 tablet by mouth at bedtime.     ? topiramate (TOPAMAX) 25 MG tablet Take 1 tablet (25 mg total) by mouth 2 (two) times daily. 60 tablet 0   ? traZODone (DESYREL) 50 MG tablet Take 1 tablet (50 mg total) by mouth at bedtime as needed for sleep. 30 tablet 0   ? ? ?Patient Stressors: Marital or family conflict   ?Medication change or noncompliance   ? ?Patient Strengths: Capable of independent living  ?General fund of knowledge  ?Motivation for treatment/growth  ?Supportive  family/friends  ? ?Treatment Modalities: Medication Management, Group therapy, Case management,  ?1 to 1 session with clinician, Psychoeducation, Recreational therapy. ? ? ?Physician Treatment Plan for Primary Diagnosis: Schizoaffective disorder, bipolar type (HCC) ?Long Term Goal(s): Improvement in symptoms so as ready for discharge  ? ?Short Term Goals: Ability to identify changes in lifestyle to reduce recurrence of condition will improve ?Ability to verbalize feelings will improve ?Ability to disclose and discuss suicidal ideas ?Ability to demonstrate self-control will improve ?Ability to identify and develop effective coping behaviors will improve ?Ability to maintain clinical measurements within normal limits will improve ?Compliance with prescribed medications will improve ? ?Medication Management:  Evaluate patient's response, side effects, and tolerance of medication regimen. ? ?Therapeutic Interventions: 1 to 1 sessions, Unit Group sessions and Medication administration. ? ?Evaluation of Outcomes: Progressing ? ?Physician Treatment Plan for Secondary Diagnosis: Principal Problem: ?  Schizoaffective disorder, bipolar type (HCC) ?Active Problems: ?  POTS (postural orthostatic tachycardia syndrome) ?  Subclinical hypothyroidism ?  PTSD (post-traumatic stress disorder) ?  History of OCD (obsessive compulsive disorder) ? ?Long Term Goal(s): Improvement in symptoms so as ready for discharge  ? ?Short Term Goals: Ability to identify changes in lifestyle to reduce recurrence of condition will improve ?Ability to verbalize feelings will improve ?Ability to disclose and discuss suicidal ideas ?Ability to demonstrate self-control will improve ?Ability to identify and develop effective coping behaviors will improve ?Ability to maintain clinical measurements within normal limits will improve ?Compliance with prescribed medications will improve    ? ?Medication Management: Evaluate patient's response, side effects, and tolerance of medication regimen. ? ?Therapeutic Interventions: 1 to 1 sessions, Unit Group sessions and Medication administration. ? ?Evaluation of Outcomes: Progressing ? ? ?RN Treatment Plan for Primary Diagnosis: Schizoaffective disorder, bipolar type (HCC) ?Long Term Goal(s): Knowledge of disease and therapeutic regimen to maintain health will improve ? ?Short Term Goals: Ability to remain free from injury will improve, Ability to verbalize frustration and anger appropriately will improve, Ability to demonstrate self-control, Ability to participate in decision making will improve, Ability to verbalize feelings will improve, Ability to disclose and discuss suicidal ideas, Ability to identify and develop effective coping behaviors will improve, and Compliance with prescribed medications will  improve ? ?Medication Management: RN will administer medications as ordered by provider, will assess and evaluate patient's response and provide education to patient for prescribed medication. RN will report any adverse and/or side effects to prescribing provider. ? ?Therapeutic Interventions: 1 on 1 counseling sessions, Psychoeducation, Medication administration, Evaluate responses to treatment, Monitor vital signs and CBGs as ordered, Perform/monitor CIWA, COWS, AIMS and Fall Risk screenings as ordered, Perform wound care treatments as ordered. ? ?Evaluation of Outcomes: Progressing ? ? ?LCSW Treatment Plan for Primary Diagnosis: Schizoaffective disorder, bipolar type (HCC) ?Long Term Goal(s): Safe transition to appropriate next level of care at discharge, Engage patient in therapeutic group addressing interpersonal concerns. ? ?Short Term Goals: Engage patient in aftercare planning with referrals and resources, Increase social support, Increase ability to appropriately verbalize feelings, Increase emotional regulation, Facilitate acceptance of mental health diagnosis and concerns, Facilitate patient progression through stages of change regarding substance use diagnoses and concerns, and Identify triggers associated with mental health/substance abuse issues ? ?Therapeutic Interventions: Assess for all discharge needs, 1 to 1 time with Child psychotherapist, Explore available resources and support systems, Assess for adequacy in community support network, Educate family and significant other(s) on suicide prevention, Complete Psychosocial Assessment, Interpersonal group therapy. ? ?Evaluation of Outcomes: Progressing ? ? ?  Progress in Treatment: ?Attending groups: Yes. ?Participating in groups: Yes. ?Taking medication as prescribed: Yes. ?Toleration medication: Yes. ?Family/Significant other contact made: Yes, [name] Armour ?Patient understands diagnosis: Yes. ?Discussing patient identified problems/goals with staff:  Yes. ?Medical problems stabilized or resolved: Yes. ?Denies suicidal/homicidal ideation: Yes. ?Issues/concerns per patient self-inventory: No. ?Other: none ? ?New problem(s) identified: No, Describe:  none reported  ? ?New

## 2021-09-15 NOTE — Plan of Care (Signed)
Patient stayed in the dayroom and maintained good behavior. Pleasant on approach and denying SI/H. Patient attended group and was active. Had a snack and received bedtime medications. Currently in bed preparing to sleep.  ?

## 2021-09-15 NOTE — Group Note (Signed)
[facility] LCSW Group Therapy Note ? ? ?Group Date: 09/15/2021 ?Start Time: 1300 ?End Time: 1400 ? ? ?Type of Therapy/Topic:  Group Therapy:  Emotion Regulation ? ?Participation Level:  Minimal  ? ?Mood: Hopeful ? ?Description of Group:   ? The purpose of this group is to assist patients in learning to regulate negative emotions and experience positive emotions. Patients will be guided to discuss ways in which they have been vulnerable to their negative emotions. These vulnerabilities will be juxtaposed with experiences of positive emotions or situations, and patients challenged to use positive emotions to combat negative ones. Special emphasis will be placed on coping with negative emotions in conflict situations, and patients will process healthy conflict resolution skills. ? ?Therapeutic Goals: ?Patient will identify two positive emotions or experiences to reflect on in order to balance out negative emotions:  ?Patient will label two or more emotions that they find the most difficult to experience:  ?Patient will be able to demonstrate positive conflict resolution skills through discussion or role plays:  ? ?Summary of Patient Progress: ? ? ?Patient participated appropriately in group.  She shared with the group that she was hopeful.  Patient silently worked on group activity and listened to group peers feedback.  No concerns noted.  ? ? ? ?Therapeutic Modalities:   ?Cognitive Behavioral Therapy ?Feelings Identification ?Dialectical Behavioral Therapy ? ? ?[name] E Katti Pelle, LCSW ?

## 2021-09-15 NOTE — Progress Notes (Signed)
D:  Patient's self inventory sheet, patient sleeps good, sleep medication helpful.  Good appetite, normal energy level, good concentration and coping skills.  Denied SI.  Denied physical problems.  Denied physical pain.  Goal is discharge plan.  Plans to talk to MD/SW.  "I've been working really hard to get better and be able to go home."  No discharge plans. ?A:  Medications administered per MD orders.  Emotional support and encouragement given patient. ?R:  Denied SI and HI, contracts for safety.  Denied A/V hallucinations.  Safety maintained with 15 minute checks. ? ? ?

## 2021-09-16 MED ORDER — ACETAMINOPHEN 325 MG PO TABS
650.0000 mg | ORAL_TABLET | Freq: Four times a day (QID) | ORAL | Status: DC | PRN
  Administered 2021-09-16: 650 mg via ORAL
  Filled 2021-09-16: qty 2

## 2021-09-16 NOTE — Progress Notes (Signed)
?   09/16/21 1959  ?Psych Admission Type (Psych Patients Only)  ?Admission Status Voluntary  ?Psychosocial Assessment  ?Patient Complaints None  ?Eye Contact Fair  ?Facial Expression Animated  ?Affect Appropriate to circumstance  ?Speech Logical/coherent  ?Interaction Assertive  ?Motor Activity Other (Comment) ?(WDL)  ?Appearance/Hygiene Unremarkable  ?Behavior Characteristics Appropriate to situation  ?Mood Pleasant  ?Thought Process  ?Coherency WDL  ?Content WDL  ?Delusions None reported or observed  ?Perception WDL  ?Hallucination None reported or observed  ?Judgment WDL  ?Confusion None  ?Danger to Self  ?Current suicidal ideation? Denies  ?Agreement Not to Harm Self Yes  ?Description of Agreement verbal  ?Danger to Others  ?Danger to Others None reported or observed  ? ? ?

## 2021-09-16 NOTE — Progress Notes (Signed)
The  patient states that she had a good day and rated her day as a 9 out of 10. She accomplished her goal for today which was to "stay positive".  ?

## 2021-09-16 NOTE — Group Note (Signed)
LCSW Group Therapy Note ? ?09/16/2021   10:30-11:30am  ? ?Topic:  Anger and its Underlying Emotions ? ?Participation Level:  Active ? ?Description of Group:   ?In this group, patients identified the primary emotions they often have in situations that eventually provoke them to anger.  TFocus was placed on how helpful it is to recognize the underlying emotions to anger in order to address these for more permanent resolution.  Emphasis was also on identifying possible replacement thoughts for the automatic thoughts generated in various situations shared by the group. ? ?Therapeutic Goals: ?Patients will share emotions that commonly incite their anger and how they typically respond ?Patients will identify how their coping skills work for them and/or against them ?Patients will explore possible alternative thoughts to their automatic ones ?Patients will learn that anger itself is normal and that healthier reactions can assist with resolving conflict rather than worsening situations ? ?Summary of Patient Progress:  The patient shared that her frequent precursor emotion to anger is being overwhelmed and/or overstimulated.  Patient indicated a willingness to try working on the underlying emotions in order to feel better both in the short-term and in the long-run. ? ?Therapeutic Modalities:   ?Cognitive Behavioral Therapy ?Processing ? ?Lynnell Chad, MSW, LCSW  ?

## 2021-09-16 NOTE — [facility] Group Notes (Signed)
.  Psychoeducational Group Note ? ? ? ?Date:  5/13//23 ?Time: 1300-1400 ? ? ? ?Purpose of Group: . The group focus' on teaching patients on how to identify their needs and their Life Skills:  A group where two lists are made. What people need and what are things that we do that are unhealthy. The lists are developed by the patients and it is explained that we often do the actions that are not healthy to get our list of needs met. ? ?Goal:: to develop the coping skills needed to get their needs met ? ?Participation Level:  Active ? ?Participation Quality:  Appropriate ? ?Affect:  Appropriate ? ?Cognitive:  Oriented ? ?Insight:  Improving ? ?Engagement in Group:  Engaged ? ?Additional Comments: Pt rates her energy at an 8/10. Participated fully in the group. ? ?Bryson Dames A ?

## 2021-09-16 NOTE — Progress Notes (Signed)
D. Pt was pleasant upon approach- voiced no complaints other than TMJ pain- rated 5/10. Pt reported that she slept well last pm, and endorsed having a good appetite and good concentration. Per pt's self inventory, pt rated her depression,hopelessness and anxiety all 0's today, and reported that her goal was "to stay focused on discharge plan, and focus on positives." Pt has been visible in the milieu, observed interacting well with peers, and attending groups.  Pt currently denies SI/HI and AVH   ?A. Labs and vitals monitored. Pt given and educated on medications. Pt supported emotionally and encouraged to express concerns and ask questions.   ?R. Pt remains safe with 15 minute checks. Will continue POC. ? ?  ?

## 2021-09-16 NOTE — [facility] Group Notes (Signed)
Psychoeducational Group Note ? ?Date: 09/16/2021 ?Time: 0900-1000 ? ? ? ?Goal Setting  ? ?Purpose of Group: This group helps to provide patients with the steps of setting a goal that is specific, measurable, attainable, realistic and time specific. A discussion on how we keep ourselves stuck with negative self talk. Homework given for Patients to write 30 positive attributes about themselves. ? ? ? ?Participation Level:  Active ? ?Participation Quality:  Appropriate ? ?Affect:  Appropriate ? ?Cognitive:  Appropriate ? ?Insight:  Improving ? ?Engagement in Group:  Engaged ? ?Additional Comments:  Pt rates her energy at a 7/10. Added greatly to the group. ? ?Vira Blanco A ?

## 2021-09-16 NOTE — Progress Notes (Signed)
EKG results placed on the outside of pt's shadow chart.  ? ?Normal sinus rhythm ? ?Normal ECG ? ?QT/Qtc-Baz     396/456 ms ?

## 2021-09-16 NOTE — Progress Notes (Addendum)
[facility] MD Progress Note ? ?09/16/2021 12:03 PM ?Sabrina Bennett  ?MRN:  [id number] ?Principal Problem: Schizoaffective disorder, bipolar type (HCC) ?Diagnosis: Principal Problem: ?  Schizoaffective disorder, bipolar type (HCC) ?Active Problems: ?  POTS (postural orthostatic tachycardia syndrome) ?  Subclinical hypothyroidism ?  PTSD (post-traumatic stress disorder) ?  History of OCD (obsessive compulsive disorder) ? ? ?Reason for Admission: Sabrina Bennett is a 21 year old female with a psychiatric history of bipolar disorder, depression, GAD, OCD, ADHD- inattentive type, ARFID, and binge-eating disorder, as well as a medical history of hypothyroidism and POTS who presented to [facility] as a walk-in and admitted voluntarily for worsening AVH with command hallucinations telling her to kill herself as well as passive SI. This is hospitalization day 6. ? ?Yesterday's Psychiatry Team's Recommendation: ?- Continue Geodon 40 mg BID given worsening psychosis.  ?- Continue Zoloft 50 mg daily for depression ?- Continue Ramelteon 8 mg Q hs for insomnia. ?- Continue Metformin 500 mg daily for antipsychotic induced metabolic effects. ?- Continue topiramate 25 mg twice daily for history of ARFID ? ?Subjective: Patient was seen and assessed at day room today. Patient reports feeling better than yesterday.  Patient denies any further punching the floor or panic attacks since yesterday.  Patient denies SI/HI/AVH.  Patient denies psychotic symptoms including paranoia, first rank symptoms, delusions. Patient reports sleeping well and eating well.  Patient feels that she will be ready for discharge on Monday.  Discussed plan for PHP starting 09/20/2021 for which patient was amenable to.  Patient denies any side effects from medication regiment and does not want any further adjustments to current medication regimen. ? ?Patient did request Tylenol for TMJ. ? ?Objective:  ?Chart Review ?Past 24 hours of patient's chart was reviewed.  ?Patient is compliant  with scheduled meds. ?Required Agitation PRNs: none ?Per RN notes, no documented behavioral issues and is attending group. ?Patient slept, 7 hours ? ?Total Time spent with patient: 45 minutes ? ?Past Psychiatric History:  ?Previous Psych Diagnoses:  bipolar disorder, depression, GAD, OCD, ADHD- inattentive type, ARFID, and binge-eating disorder ?Prior inpatient treatment: Denies ?Current/prior outpatient treatment/psychotherapy: Yes ?Prior rehab hx: Denies ?History of suicide: Denies attempt; history of cutting ?History of homicide: Denies ?Psychiatric medication history: Lamictal 100 mg, Zoloft 200 mg, Zyprexa 2.5 mg (14 lb weight gain), Vyvanse, Vraylar 6 mg (since 07/2020), Lithium 300 mg ?Psychiatric medication compliance history: Compliant ?Neuromodulation history: Denies ?Current Psychiatrist and therapist:John Deeann Saint (last seen last month when hydroxyzine was discontinued and discussion to discontinue topiramate) and therapist Bradly Bienenstock (last seen November 2022) through [facility] in [place] ? ?Family Psychiatric  History:  ?Psych: Mom and younger brother with ADHD; older brother with depression/anxiety ?Psych Rx: Unknown ?SA/HA: Denies ?Substance use family hx: Dad- alcohol use disorder ? ?Social History:  ?Social History  ? ?Substance and Sexual Activity  ?Alcohol Use Yes  ?   ?Social History  ? ?Substance and Sexual Activity  ?Drug Use Never  ?  ?Social History  ? ?Socioeconomic History  ? Marital status: Single  ?  Spouse name: Not on file  ? Number of children: Not on file  ? Years of education: Not on file  ? Highest education level: Not on file  ?Occupational History  ? Not on file  ?Tobacco Use  ? Smoking status: Never  ?  Passive exposure: Never  ? Smokeless tobacco: Never  ?Vaping Use  ? Vaping Use: Never used  ?Substance and Sexual Activity  ? Alcohol use: Yes  ?  Drug use: Never  ? Sexual activity: Yes  ?  Birth control/protection: Pill, Condom  ?Other Topics Concern  ?  Not on file  ?Social History Narrative  ? Not on file  ? ?Social Determinants of Health  ? ?Financial Resource Strain: Not on file  ?Food Insecurity: Not on file  ?Transportation Needs: Not on file  ?Physical Activity: Not on file  ?Stress: Not on file  ?Social Connections: Not on file  ? ?Additional Social History:  ?  ?  ?  ?  ?  ?  ?  ?  ?  ?  ?  ? ?Current Medications: ?Current Facility-Administered Medications  ?Medication Dose Route Frequency Provider Last Rate Last Admin  ? acetaminophen (TYLENOL) tablet 650 mg  650 mg Oral Q6H PRN Park Pope, MD   650 mg at 09/16/21 1009  ? haloperidol (HALDOL) tablet 5 mg  5 mg Oral Q6H PRN Lamar Sprinkles, MD   5 mg at 09/13/21 2031  ? Or  ? haloperidol lactate (HALDOL) injection 5 mg  5 mg Intramuscular Q6H PRN Lamar Sprinkles, MD      ? hydrOXYzine (ATARAX) tablet 25 mg  25 mg Oral TID PRN Bobbitt, Shalon E, NP   25 mg at 09/14/21 2110  ? levothyroxine (SYNTHROID) tablet 75 mcg  75 mcg Oral Q0600 Bobbitt, Shalon E, NP   75 mcg at 09/16/21 9983  ? metFORMIN (GLUCOPHAGE) tablet 500 mg  500 mg Oral Q breakfast Bobbitt, Shalon E, NP   500 mg at 09/16/21 0753  ? midodrine (PROAMATINE) tablet 2.5 mg  2.5 mg Oral BID WC Lamar Sprinkles, MD   2.5 mg at 09/16/21 3825  ? ramelteon (ROZEREM) tablet 8 mg  8 mg Oral QHS Lamar Sprinkles, MD   8 mg at 09/15/21 2108  ? sertraline (ZOLOFT) tablet 50 mg  50 mg Oral Daily Lamar Sprinkles, MD   50 mg at 09/16/21 0754  ? topiramate (TOPAMAX) tablet 25 mg  25 mg Oral BID Bobbitt, Shalon E, NP   25 mg at 09/16/21 0754  ? vitamin B-12 (CYANOCOBALAMIN) tablet 50 mcg  50 mcg Oral Daily Park Pope, MD   50 mcg at 09/16/21 0753  ? ziprasidone (GEODON) capsule 40 mg  40 mg Oral BID WC Lamar Sprinkles, MD   40 mg at 09/16/21 0754  ? ? ?Lab Results:  ?No results found for this or any previous visit (from the past 24 hour(s)). ? ?Blood Alcohol level:  ?No results found for: [facility] ? ?Metabolic Disorder Labs: ?Lab Results  ?Component Value Date  ? HGBA1C  5.3 09/11/2021  ? MPG 105.41 09/11/2021  ? MPG 96.8 05/03/2021  ? ?No results found for: PROLACTIN ?Lab Results  ?Component Value Date  ? CHOL 162 09/11/2021  ? TRIG 53 09/11/2021  ? HDL 66 09/11/2021  ? CHOLHDL 2.5 09/11/2021  ? VLDL 11 09/11/2021  ? LDLCALC 85 09/11/2021  ? LDLCALC 84 05/03/2021  ? ? ?Physical Findings: ?Musculoskeletal: ?Strength & Muscle Tone: within normal limits ?Gait & Station: normal ? ?Psychiatric Specialty Exam: ? ?Presentation  ?General Appearance: Appropriate for Environment; Neat ? ? ?Eye Contact:Good ? ? ?Speech:Clear and Coherent; Normal Rate ? ? ?Speech Volume:Normal ? ? ?Handedness:No data recorded ? ? ?Mood and Affect  ?Mood:Euthymic ? ? ?Affect:Congruent ? ? ? ?Thought Process  ?Thought Processes:Coherent; Goal Directed; Linear ? ? ?Descriptions of Associations:Intact ? ? ?Orientation:Full (Time, Place and Person) ? ? ?Thought Content:Logical ? ? ?History of Schizophrenia/Schizoaffective disorder:No ? ? ?Duration of Psychotic  Symptoms:No data recorded ? ?Hallucinations:Hallucinations: None ? ?Ideas of Reference:None ? ? ?Suicidal Thoughts:Suicidal Thoughts: No ? ?Homicidal Thoughts:Homicidal Thoughts: No ? ? ?Sensorium  ?Memory:Immediate Good; Recent Good; Remote Good ? ? ?Judgment:Fair ? ? ?Insight:Fair ? ? ? ?Executive Functions  ?Concentration:Good ? ? ?Attention Span:Good ? ? ?Recall:Good ? ? ?Fund of Knowledge:Good ? ? ?Language:Good ? ? ? ?Psychomotor Activity  ?AIMS: 0, no cogwheeling rigidity or abnormal facial movements ?Psychomotor Activity:Psychomotor Activity: Normal ? ?Assets  ?Assets:Communication Skills; Desire for Improvement; Financial Resources/Insurance; Housing; Intimacy; Resilience; Social Support; Talents/Skills ? ? ? ?Sleep  ?Sleep:Sleep: Good ?Number of Hours of Sleep: 7 ? ? ?Physical Exam: ?Review of Systems  ?Respiratory:  Negative for shortness of breath.   ?Cardiovascular:  Negative for chest pain.  ?Gastrointestinal:  Negative for abdominal pain,  constipation, diarrhea, heartburn, nausea and vomiting.  ?Neurological:  Negative for headaches.  ?Blood pressure 114/72, pulse 98, temperature 97.8 ?F (36.6 ?C), temperature source Oral, resp. rate 18, he

## 2021-09-17 DIAGNOSIS — F250 Schizoaffective disorder, bipolar type: Principal | ICD-10-CM

## 2021-09-17 NOTE — Group Note (Signed)
[facility] LCSW Group Therapy Note ? ?09/17/2021  10:00-11:00AM ? ?Type of Therapy and Topic:  Group Therapy:  Acknowledging and Resolving Issues with Mothers ? ?Participation Level:  Active  ? ?Description of Group:   Patients in this group were asked to briefly describe their experience with the mother figure(s) in their lives, both in childhood and adulthood.  Different types of support provided by these individuals were identified.   Patients were then encouraged to determine whether their mother figure was or is a healthy or unhealthy support.  The manner in which that early relationship has shaped patient's feelings and life decisions was pointed out and acknowledged.  Group members gave support to each other.  CSW led a discussion on how helpful it can be to resolve past issues, and how this can be done whether the mother figure is now alive or already deceased.  An emphasis was placed on continuing to work with a therapist on these issues  when patients leave the hospital in order to be able to focus on the future instead of the past, to continue becoming healthier and happier.  ? ?Therapeutic Goals: ?1)  discuss the possibility of mother figure(s) being positive and/or negative in one's life, normalizing that some people never had positive experiences with "maternal" persons ? 2)  describe patient's specific example of mother figure(s), allowing time to vent ? 3)  identify the patient's current need for resolution in the relationship with the aforementioned person ? 4)  elicit commitments to work on resolving feelings about mother figure(s) in order to move forward in life and wellness ?  ?Summary of Patient Progress:  The patient expressed full comprehension of the concepts presented, and shared that her childhood with mother was positive.  She was the only female, with two brothers and this caused a closer bond between mother/patient that still exists.  Father was an alcoholic and mother took on the role of  protecting them from him, which was good, but also means that now the patient wants to protect mother from father, "I owe it to her."  Parents live 1-1/2 hours away, however.  She stated her mother wants to leave her father, but just has not made that decision yet. ?  ?Therapeutic Modalities:   ?Processing ?Brief Solution-Focused Therapy ? ?Lynnell Chad  ? ?   ?

## 2021-09-17 NOTE — [facility] Group Notes (Signed)
Adult Psychoeducational Group  ?Date:  09/17/2021 ?Time:  1300-1400 ? ?Group Topic/Focus: Continuation of the group from Saturday. Looking at the lists that were created and talking about what needs to be done with the homework of 30 positives about themselves.  ?                                   Talking about taking their power back and helping themselves to develop a positive self esteem. ?     ?Participation Quality:  Appropriate ? ?Affect:  Appropriate ? ?Cognitive:  Oriented ? ?Insight: Improving ? ?Engagement in Group:  Engaged ? ?Modes of Intervention:  Activity, Discussion, Education, and Support ? ?Additional Comments:  Rates her energy at a 7/10. Participated in the group. ? ?Sabrina Bennett A ? ?

## 2021-09-17 NOTE — [facility] Group Notes (Signed)
Adult Psychoeducational Group Not ?Date:  09/17/2021 ?Time:  [id number] ?Group Topic/Focus: PROGRESSIVE RELAXATION. A group where deep breathing is taught and tensing and relaxation muscle groups is used. Imagery is used as well.  Pts are asked to imagine 3 pillars that hold them up when they are not able to hold themselves up and to share that with the group. ? ?Participation Level:  Active ? ?Participation Quality:  Appropriate ? ?Affect:  Appropriate ? ?Cognitive:  Oriented ? ?Insight: Improving ? ?Engagement in Group:  Engaged ? ?Modes of Intervention:  Activity, Discussion, Education, and Support ? ?Additional Comments:  Pt rates her energy at a 7.5. States what holds her up is her family, her books and her Dog. Participated fully in the group. ? ?Bryson Dames A ? ? ?

## 2021-09-17 NOTE — Progress Notes (Signed)
[facility] MD Progress Note ? ?09/17/2021 1:10 PM ?Sabrina Bennett  ?MRN:  [id number] ?Principal Problem: Schizoaffective disorder, bipolar type (HCC) ?Diagnosis: Principal Problem: ?  Schizoaffective disorder, bipolar type (HCC) ?Active Problems: ?  POTS (postural orthostatic tachycardia syndrome) ?  Subclinical hypothyroidism ?  PTSD (post-traumatic stress disorder) ?  History of OCD (obsessive compulsive disorder) ? ?Subjective: Patient was seen and assessed at the office today. Patient reports feeling better than yesterday.  Patient denies any further punching the floor or panic attacks since yesterday.  Patient denies SI/HI/AVH. Rated anxiety as "3" and depression as "0" on a scale of 0 to 10. Patient denies psychotic symptoms including paranoia, first rank symptoms, delusions. Patient reports sleeping well and eating well. Reported pleasant mood. Patient feels that she will be ready for discharge on Monday, 09/18/21.  Discussed plan for PHP starting 09/20/2021 for which patient was amenable to.  Patient denies any side effects from medication regiment and does not want any further adjustments to current medication regimen. ? ?Brief History: Sabrina Bennett is a 21 year old female with a psychiatric history of bipolar disorder, depression, GAD, OCD, ADHD- inattentive type, ARFID, and binge-eating disorder, as well as a medical history of hypothyroidism and POTS who presented to [facility] as a walk-in and admitted voluntarily for worsening AVH with command hallucinations telling her to kill herself as well as passive SI. This is hospitalization day 7. ? ?Today's Psychiatry Team's Recommendation: ?- Continue Geodon 40 mg BID given worsening psychosis.  ?- Continue Zoloft 50 mg daily for depression ?- Continue Ramelteon 8 mg Q hs for insomnia. ?- Continue Metformin 500 mg daily for antipsychotic induced metabolic effects. ?- Continue topiramate 25 mg twice daily for history of ARFID ? ?Objective:  ?Chart Review ?Past 24 hours of  patient's chart was reviewed.  ?Patient is compliant with scheduled meds. ?Required Agitation PRNs: none ?Per RN notes, no documented behavioral issues and is attending group. ?Patient slept, 9 hours,  ? ?Total Time spent with patient: 45 minutes ? ?Past Psychiatric History:  ?Previous Psych Diagnoses:  bipolar disorder, depression, GAD, OCD, ADHD- inattentive type, ARFID, and binge-eating disorder ?Prior inpatient treatment: Denies ?Current/prior outpatient treatment/psychotherapy: Yes ?Prior rehab hx: Denies ?History of suicide: Denies attempt; history of cutting ?History of homicide: Denies ?Psychiatric medication history: Lamictal 100 mg, Zoloft 200 mg, Zyprexa 2.5 mg (14 lb weight gain), Vyvanse, Vraylar 6 mg (since 07/2020), Lithium 300 mg ?Psychiatric medication compliance history: Compliant ?Neuromodulation history: Denies ?Current Psychiatrist and therapist:John Deeann Saint (last seen last month when hydroxyzine was discontinued and discussion to discontinue topiramate) and therapist Bradly Bienenstock (last seen November 2022) through [facility] in [place] ? ?Family Psychiatric  History:  ?Psych: Mom and younger brother with ADHD; older brother with depression/anxiety ?Psych Rx: Unknown ?SA/HA: Denies ?Substance use family hx: Dad- alcohol use disorder ? ?Social History:  ?Social History  ? ?Substance and Sexual Activity  ?Alcohol Use Yes  ?   ?Social History  ? ?Substance and Sexual Activity  ?Drug Use Never  ?  ?Social History  ? ?Socioeconomic History  ? Marital status: Single  ?  Spouse name: Not on file  ? Number of children: Not on file  ? Years of education: Not on file  ? Highest education level: Not on file  ?Occupational History  ? Not on file  ?Tobacco Use  ? Smoking status: Never  ?  Passive exposure: Never  ? Smokeless tobacco: Never  ?Vaping Use  ? Vaping Use: Never used  ?Substance  and Sexual Activity  ? Alcohol use: Yes  ? Drug use: Never  ? Sexual activity: Yes  ?  Birth  control/protection: Pill, Condom  ?Other Topics Concern  ? Not on file  ?Social History Narrative  ? Not on file  ? ?Social Determinants of Health  ? ?Financial Resource Strain: Not on file  ?Food Insecurity: Not on file  ?Transportation Needs: Not on file  ?Physical Activity: Not on file  ?Stress: Not on file  ?Social Connections: Not on file  ? ?Additional Social History:  ?  ?Current Medications: ?Current Facility-Administered Medications  ?Medication Dose Route Frequency Provider Last Rate Last Admin  ? acetaminophen (TYLENOL) tablet 650 mg  650 mg Oral Q6H PRN Park PopeJi, Andrew, MD   650 mg at 09/16/21 1009  ? haloperidol (HALDOL) tablet 5 mg  5 mg Oral Q6H PRN Lamar Sprinklesosby, Courtney, MD   5 mg at 09/13/21 2031  ? Or  ? haloperidol lactate (HALDOL) injection 5 mg  5 mg Intramuscular Q6H PRN Lamar Sprinklesosby, Courtney, MD      ? hydrOXYzine (ATARAX) tablet 25 mg  25 mg Oral TID PRN Bobbitt, Shalon E, NP   25 mg at 09/16/21 2101  ? levothyroxine (SYNTHROID) tablet 75 mcg  75 mcg Oral Q0600 Bobbitt, Shalon E, NP   75 mcg at 09/17/21 0601  ? metFORMIN (GLUCOPHAGE) tablet 500 mg  500 mg Oral Q breakfast Bobbitt, Shalon E, NP   500 mg at 09/17/21 0725  ? midodrine (PROAMATINE) tablet 2.5 mg  2.5 mg Oral BID WC Lamar Sprinklesosby, Courtney, MD   2.5 mg at 09/17/21 0724  ? ramelteon (ROZEREM) tablet 8 mg  8 mg Oral QHS Lamar Sprinklesosby, Courtney, MD   8 mg at 09/16/21 2101  ? sertraline (ZOLOFT) tablet 50 mg  50 mg Oral Daily Lamar Sprinklesosby, Courtney, MD   50 mg at 09/17/21 0725  ? topiramate (TOPAMAX) tablet 25 mg  25 mg Oral BID Bobbitt, Shalon E, NP   25 mg at 09/17/21 0725  ? vitamin B-12 (CYANOCOBALAMIN) tablet 50 mcg  50 mcg Oral Daily Park PopeJi, Andrew, MD   50 mcg at 09/17/21 0725  ? ziprasidone (GEODON) capsule 40 mg  40 mg Oral BID WC Lamar Sprinklesosby, Courtney, MD   40 mg at 09/17/21 0724  ? ? ?Lab Results:  ?No results found for this or any previous visit (from the past 24 hour(s)). ? ?Blood Alcohol level:  ?[facility]: [facility] ? ?Metabolic Disorder Labs: ?Lab Results   ?Component Value Date  ? HGBA1C 5.3 09/11/2021  ? MPG 105.41 09/11/2021  ? MPG 96.8 05/03/2021  ? ?No results found for: PROLACTIN ?Lab Results  ?Component Value Date  ? CHOL 162 09/11/2021  ? TRIG 53 09/11/2021  ? HDL 66 09/11/2021  ? CHOLHDL 2.5 09/11/2021  ? VLDL 11 09/11/2021  ? LDLCALC 85 09/11/2021  ? LDLCALC 84 05/03/2021  ? ? ?Physical Findings: ?Musculoskeletal: ?Strength & Muscle Tone: within normal limits ?Gait & Station: normal ? ?Psychiatric Specialty Exam: ? ?Presentation  ?General Appearance: Appropriate for Environment; Casual; Fairly Groomed ? ?Eye Contact:Good ? ?Speech:Clear and Coherent; Normal Rate ? ?Speech Volume:Normal ? ?Handedness:Right ? ?Mood and Affect  ?Mood:Euthymic ? ?Affect:Appropriate; Congruent; Full Range ? ?Thought Process  ?Thought Processes:Coherent; Goal Directed ? ?Descriptions of Associations:Intact ? ?Orientation:Full (Time, Place and Person) ? ?Thought Content:Logical; WDL ? ?History of Schizophrenia/Schizoaffective disorder:No ? ?Duration of Psychotic Symptoms:No data recorded ? ?Hallucinations:Hallucinations: None ? ?Ideas of Reference:None ? ?Suicidal Thoughts:Suicidal Thoughts: No ?SI Active Intent and/or Plan: -- (  none) ?SI Passive Intent and/or Plan: -- (none) ? ?Homicidal Thoughts:Homicidal Thoughts: No ? ?Sensorium  ?Memory:Immediate Good; Recent Good; Remote Good ? ?Judgment:Good ? ?Insight:Good ? ?Executive Functions  ?Concentration:Good ? ?Attention Span:Good ? ?Recall:Good ? ?Fund of Knowledge:Good ? ?Language:Good ? ?Psychomotor Activity  ?AIMS: 0, no cogwheeling rigidity or abnormal facial movements ?Psychomotor Activity:Psychomotor Activity: Normal ? ?Assets  ?Assets:Communication Skills; Desire for Improvement; Physical Health; Social Support; Intimacy ? ?Sleep  ?Sleep:Sleep: Good ?Number of Hours of Sleep: 9 ? ?Physical Exam: ?Review of Systems  ?Constitutional: Negative.  Negative for chills and fever.  ?HENT: Negative.  Negative for hearing loss and  tinnitus.   ?Eyes: Negative.  Negative for blurred vision and double vision.  ?Respiratory:  Negative for cough, sputum production, shortness of breath and wheezing.   ?Cardiovascular: Negative.  Negative for chest pain a

## 2021-09-17 NOTE — Progress Notes (Signed)
[facility] Group Notes:  (Nursing/MHT/Case Management/Adjunct) ? ?Date:  09/17/2021  ?Time:  2015 ? ?Type of Therapy:   wrap up group ? ?Participation Level:  Active ? ?Participation Quality:  Appropriate, Attentive, Sharing, and Supportive ? ?Affect:  Appropriate ? ?Cognitive:  Alert ? ?Insight:  Improving ? ?Engagement in Group:  Engaged ? ?Modes of Intervention:  Clarification, Education, and Support ? ?Summary of Progress/Problems: Positive thinking and self-care were discussed.  ? ?Johann Capers S ?09/17/2021, 9:03 PM ?

## 2021-09-17 NOTE — Progress Notes (Signed)
?   09/17/21 2200  ?Psych Admission Type (Psych Patients Only)  ?Admission Status Voluntary  ?Psychosocial Assessment  ?Patient Complaints None  ?Eye Contact Fair  ?Facial Expression Flat  ?Affect Appropriate to circumstance  ?Speech Logical/coherent  ?Interaction Assertive  ?Motor Activity Other (Comment) ?(WDL)  ?Appearance/Hygiene Unremarkable  ?Behavior Characteristics Appropriate to situation  ?Mood Pleasant  ?Thought Process  ?Coherency WDL  ?Content WDL  ?Delusions None reported or observed  ?Perception WDL  ?Hallucination None reported or observed  ?Judgment WDL  ?Confusion None  ?Danger to Self  ?Current suicidal ideation? Denies  ?Agreement Not to Harm Self Yes  ?Description of Agreement verbal  ?Danger to Others  ?Danger to Others None reported or observed  ? ? ?

## 2021-09-17 NOTE — Progress Notes (Signed)
D. Pt is pleasant upon approach- voices no complaints. Pt has been visible in the milieu interacting well with peers and staff, and observed attending groups. Pt currently denies SI/HI and AVH  ?A. Labs and vitals monitored. Pt given and educated on medications. Pt supported emotionally and encouraged to express concerns and ask questions.   ?R. Pt remains safe with 15 minute checks. Will continue POC. ? ?  ?

## 2021-09-18 MED ORDER — SERTRALINE HCL 50 MG PO TABS: 50.0000 mg | ORAL_TABLET | Freq: Every day | ORAL | 0 refills | Status: AC

## 2021-09-18 MED ORDER — METFORMIN HCL 500 MG PO TABS: 500.0000 mg | ORAL_TABLET | Freq: Every day | ORAL | 0 refills | Status: AC

## 2021-09-18 MED ORDER — ZIPRASIDONE HCL 40 MG PO CAPS: 40.0000 mg | ORAL_CAPSULE | Freq: Two times a day (BID) | ORAL | 0 refills | Status: DC

## 2021-09-18 MED ORDER — RAMELTEON 8 MG PO TABS
8.0000 mg | ORAL_TABLET | Freq: Every day | ORAL | 0 refills | Status: DC
Start: 2021-09-18 — End: 2021-09-26

## 2021-09-18 MED ORDER — CYANOCOBALAMIN 50 MCG PO TABS: 50.0000 ug | ORAL_TABLET | Freq: Every day | ORAL | 0 refills | Status: AC

## 2021-09-18 MED ORDER — HYDROXYZINE HCL 25 MG PO TABS
25.0000 mg | ORAL_TABLET | Freq: Three times a day (TID) | ORAL | 0 refills | Status: DC | PRN
Start: 2021-09-18 — End: 2021-09-26

## 2021-09-18 MED ORDER — TOPIRAMATE 25 MG PO TABS: 25.0000 mg | ORAL_TABLET | Freq: Two times a day (BID) | ORAL | 0 refills | Status: AC

## 2021-09-18 NOTE — Plan of Care (Signed)
?  Problem: Education: ?Goal: Knowledge of [facility] Education information/materials will improve ?09/18/2021 0825 by Virgel Paling, RN ?Outcome: Progressing ?09/18/2021 0825 by Virgel Paling, RN ?Outcome: Progressing ?Goal: Emotional status will improve ?09/18/2021 0825 by Virgel Paling, RN ?Outcome: Progressing ?09/18/2021 0825 by Virgel Paling, RN ?Outcome: Progressing ?Goal: Mental status will improve ?09/18/2021 0825 by Virgel Paling, RN ?Outcome: Progressing ?09/18/2021 0825 by Virgel Paling, RN ?Outcome: Progressing ?Goal: Verbalization of understanding the information provided will improve ?09/18/2021 0825 by Virgel Paling, RN ?Outcome: Progressing ?09/18/2021 0825 by Virgel Paling, RN ?Outcome: Progressing ?  ?Problem: Activity: ?Goal: Interest or engagement in activities will improve ?09/18/2021 0825 by Virgel Paling, RN ?Outcome: Progressing ?09/18/2021 0825 by Virgel Paling, RN ?Outcome: Progressing ?Goal: Sleeping patterns will improve ?09/18/2021 0825 by Virgel Paling, RN ?Outcome: Progressing ?09/18/2021 0825 by Virgel Paling, RN ?Outcome: Progressing ?  ?Problem: Health Behavior/Discharge Planning: ?Goal: Identification of resources available to assist in meeting health care needs will improve ?09/18/2021 0825 by Virgel Paling, RN ?Outcome: Progressing ?09/18/2021 0825 by Virgel Paling, RN ?Outcome: Progressing ?Goal: Compliance with treatment plan for underlying cause of condition will improve ?09/18/2021 0825 by Virgel Paling, RN ?Outcome: Progressing ?09/18/2021 0825 by Virgel Paling, RN ?Outcome: Progressing ?  ?Problem: Physical Regulation: ?Goal: Ability to maintain clinical measurements within normal limits will improve ?09/18/2021 0825 by Virgel Paling, RN ?Outcome: Progressing ?09/18/2021 0825 by Virgel Paling, RN ?Outcome: Progressing ?  ?Problem: Safety: ?Goal: Periods of time without injury will increase ?09/18/2021 0825 by  Virgel Paling, RN ?Outcome: Progressing ?09/18/2021 0825 by Virgel Paling, RN ?Outcome: Progressing ?  ?Problem: Education: ?Goal: Ability to make informed decisions regarding treatment will improve ?09/18/2021 0825 by Virgel Paling, RN ?Outcome: Progressing ?09/18/2021 0825 by Virgel Paling, RN ?Outcome: Progressing ?  ?Problem: Coping: ?Goal: Coping ability will improve ?09/18/2021 0825 by Virgel Paling, RN ?Outcome: Progressing ?09/18/2021 0825 by Virgel Paling, RN ?Outcome: Progressing ?  ?Problem: Self-Concept: ?Goal: Will verbalize positive feelings about self ?09/18/2021 0825 by Virgel Paling, RN ?Outcome: Progressing ?09/18/2021 0825 by Virgel Paling, RN ?Outcome: Progressing ?  ?Problem: Education: ?Goal: Will be free of psychotic symptoms ?09/18/2021 0825 by Virgel Paling, RN ?Outcome: Progressing ?09/18/2021 0825 by Virgel Paling, RN ?Outcome: Progressing ?Goal: Knowledge of the prescribed therapeutic regimen will improve ?09/18/2021 0825 by Virgel Paling, RN ?Outcome: Progressing ?09/18/2021 0825 by Virgel Paling, RN ?Outcome: Progressing ?  ?Problem: Self-Concept: ?Goal: Will verbalize positive feelings about self ?09/18/2021 0825 by Virgel Paling, RN ?Outcome: Progressing ?09/18/2021 0825 by Virgel Paling, RN ?Outcome: Progressing ?  ?Problem: Health Behavior/Discharge Planning: ?Goal: Ability to make decisions will improve ?09/18/2021 0825 by Virgel Paling, RN ?Outcome: Progressing ?09/18/2021 0825 by Virgel Paling, RN ?Outcome: Progressing ?Goal: Compliance with therapeutic regimen will improve ?Outcome: Progressing ?  ?Problem: Safety: ?Goal: Ability to disclose and discuss suicidal ideas will improve ?Outcome: Progressing ?Goal: Ability to identify and utilize support systems that promote safety will improve ?Outcome: Progressing ?  ?Problem: Self-Concept: ?Goal: Will verbalize positive feelings about self ?Outcome: Progressing ?Goal: Level of  anxiety will decrease ?Outcome: Progressing ?  ?

## 2021-09-18 NOTE — Progress Notes (Addendum)
D:  Patient denied SI and HI, contracts for safety.  Denied A/V hallucinations.  Denied pain. ?A:  Medications administered per MD orders.  Emotional support and encouragement given patient. ?R:  Safety maintained with 15 minute checks. ? ?Patient's self inventory sheet, patient sleeps good, sleep medication helpful.  Good appetite, normal energy level, good concentration.  Denied depression, hopeless and anxiety.  Denied withdrawals.  Denied SI.  Denied physical problems.  Denied physical pain.  Goal is discharge.  Plans to finalize plans with MD/SW.  Does have discharge plans. ? ?

## 2021-09-18 NOTE — Plan of Care (Signed)
?Problem: Education: ?Goal: Knowledge of [facility] Education information/materials will improve ?09/18/2021 0916 by Virgel Paling, RN ?Outcome: Adequate for Discharge ?09/18/2021 0825 by Virgel Paling, RN ?Outcome: Progressing ?Goal: Emotional status will improve ?09/18/2021 0916 by Virgel Paling, RN ?Outcome: Adequate for Discharge ?09/18/2021 0825 by Virgel Paling, RN ?Outcome: Progressing ?Goal: Mental status will improve ?09/18/2021 0916 by Virgel Paling, RN ?Outcome: Adequate for Discharge ?09/18/2021 0825 by Virgel Paling, RN ?Outcome: Progressing ?Goal: Verbalization of understanding the information provided will improve ?09/18/2021 0916 by Virgel Paling, RN ?Outcome: Adequate for Discharge ?09/18/2021 0825 by Virgel Paling, RN ?Outcome: Progressing ?  ?Problem: Activity: ?Goal: Interest or engagement in activities will improve ?09/18/2021 0916 by Virgel Paling, RN ?Outcome: Adequate for Discharge ?09/18/2021 0825 by Virgel Paling, RN ?Outcome: Progressing ?Goal: Sleeping patterns will improve ?09/18/2021 0916 by Virgel Paling, RN ?Outcome: Adequate for Discharge ?09/18/2021 0825 by Virgel Paling, RN ?Outcome: Progressing ?  ?Problem: Coping: ?Goal: Ability to verbalize frustrations and anger appropriately will improve ?Outcome: Adequate for Discharge ?Goal: Ability to demonstrate self-control will improve ?Outcome: Adequate for Discharge ?  ?Problem: Health Behavior/Discharge Planning: ?Goal: Identification of resources available to assist in meeting health care needs will improve ?09/18/2021 0916 by Virgel Paling, RN ?Outcome: Adequate for Discharge ?09/18/2021 0825 by Virgel Paling, RN ?Outcome: Progressing ?Goal: Compliance with treatment plan for underlying cause of condition will improve ?09/18/2021 0916 by Virgel Paling, RN ?Outcome: Adequate for Discharge ?09/18/2021 0825 by Virgel Paling, RN ?Outcome: Progressing ?  ?Problem: Physical  Regulation: ?Goal: Ability to maintain clinical measurements within normal limits will improve ?09/18/2021 0916 by Virgel Paling, RN ?Outcome: Adequate for Discharge ?09/18/2021 0825 by Virgel Paling, RN ?Outcome: Progressing ?  ?Problem: Safety: ?Goal: Periods of time without injury will increase ?09/18/2021 0916 by Virgel Paling, RN ?Outcome: Adequate for Discharge ?09/18/2021 0825 by Virgel Paling, RN ?Outcome: Progressing ?  ?Problem: Education: ?Goal: Ability to make informed decisions regarding treatment will improve ?09/18/2021 0916 by Virgel Paling, RN ?Outcome: Adequate for Discharge ?09/18/2021 0825 by Virgel Paling, RN ?Outcome: Progressing ?  ?Problem: Coping: ?Goal: Coping ability will improve ?09/18/2021 0916 by Virgel Paling, RN ?Outcome: Adequate for Discharge ?09/18/2021 0825 by Virgel Paling, RN ?Outcome: Progressing ?  ?Problem: Health Behavior/Discharge Planning: ?Goal: Identification of resources available to assist in meeting health care needs will improve ?Outcome: Adequate for Discharge ?  ?Problem: Medication: ?Goal: Compliance with prescribed medication regimen will improve ?Outcome: Adequate for Discharge ?  ?Problem: Self-Concept: ?Goal: Ability to disclose and discuss suicidal ideas will improve ?Outcome: Adequate for Discharge ?Goal: Will verbalize positive feelings about self ?09/18/2021 0916 by Virgel Paling, RN ?Outcome: Adequate for Discharge ?09/18/2021 0825 by Virgel Paling, RN ?Outcome: Progressing ?  ?Problem: Activity: ?Goal: Will verbalize the importance of balancing activity with adequate rest periods ?Outcome: Adequate for Discharge ?  ?Problem: Education: ?Goal: Will be free of psychotic symptoms ?09/18/2021 0916 by Virgel Paling, RN ?Outcome: Adequate for Discharge ?09/18/2021 0825 by Virgel Paling, RN ?Outcome: Progressing ?Goal: Knowledge of the prescribed therapeutic regimen will improve ?09/18/2021 0916 by Virgel Paling,  RN ?Outcome: Adequate for Discharge ?09/18/2021 0825 by Virgel Paling, RN ?Outcome: Progressing ?  ?Problem: Coping: ?Goal: Coping ability will improve ?Outcome: Adequate for Discharge ?Goal: Will verbalize feelings ?Outcome: Adequate for Discharge ?  ?Problem: Health Behavior/Discharge Planning: ?Goal: Compliance with prescribed medication regimen  will improve ?Outcome: Adequate for Discharge ?  ?Problem: Nutritional: ?Goal: Ability to achieve adequate nutritional intake will improve ?Outcome: Adequate for Discharge ?  ?Problem: Role Relationship: ?Goal: Ability to communicate needs accurately will improve ?Outcome: Adequate for Discharge ?Goal: Ability to interact with others will improve ?Outcome: Adequate for Discharge ?  ?Problem: Safety: ?Goal: Ability to redirect hostility and anger into socially appropriate behaviors will improve ?Outcome: Adequate for Discharge ?Goal: Ability to remain free from injury will improve ?Outcome: Adequate for Discharge ?  ?Problem: Self-Care: ?Goal: Ability to participate in self-care as condition permits will improve ?Outcome: Adequate for Discharge ?  ?Problem: Self-Concept: ?Goal: Will verbalize positive feelings about self ?09/18/2021 0916 by Virgel Paling, RN ?Outcome: Adequate for Discharge ?09/18/2021 0825 by Virgel Paling, RN ?Outcome: Progressing ?  ?Problem: Education: ?Goal: Utilization of techniques to improve thought processes will improve ?Outcome: Adequate for Discharge ?Goal: Knowledge of the prescribed therapeutic regimen will improve ?Outcome: Adequate for Discharge ?  ?Problem: Activity: ?Goal: Interest or engagement in leisure activities will improve ?Outcome: Adequate for Discharge ?Goal: Imbalance in normal sleep/wake cycle will improve ?Outcome: Adequate for Discharge ?  ?Problem: Coping: ?Goal: Coping ability will improve ?Outcome: Adequate for Discharge ?Goal: Will verbalize feelings ?Outcome: Adequate for Discharge ?  ?Problem: Health  Behavior/Discharge Planning: ?Goal: Ability to make decisions will improve ?09/18/2021 0916 by Virgel Paling, RN ?Outcome: Adequate for Discharge ?09/18/2021 0825 by Virgel Paling, RN ?Outcome: Progressing ?Goal: Compliance with therapeutic regimen will improve ?09/18/2021 0916 by Virgel Paling, RN ?Outcome: Adequate for Discharge ?09/18/2021 0825 by Virgel Paling, RN ?Outcome: Progressing ?  ?Problem: Role Relationship: ?Goal: Will demonstrate positive changes in social behaviors and relationships ?Outcome: Adequate for Discharge ?  ?Problem: Safety: ?Goal: Ability to disclose and discuss suicidal ideas will improve ?09/18/2021 0916 by Virgel Paling, RN ?Outcome: Adequate for Discharge ?09/18/2021 0825 by Virgel Paling, RN ?Outcome: Progressing ?Goal: Ability to identify and utilize support systems that promote safety will improve ?09/18/2021 0916 by Virgel Paling, RN ?Outcome: Adequate for Discharge ?09/18/2021 0825 by Virgel Paling, RN ?Outcome: Progressing ?  ?Problem: Self-Concept: ?Goal: Will verbalize positive feelings about self ?09/18/2021 0916 by Virgel Paling, RN ?Outcome: Adequate for Discharge ?09/18/2021 0825 by Virgel Paling, RN ?Outcome: Progressing ?Goal: Level of anxiety will decrease ?09/18/2021 0916 by Virgel Paling, RN ?Outcome: Adequate for Discharge ?09/18/2021 0825 by Virgel Paling, RN ?Outcome: Progressing ?  ?

## 2021-09-18 NOTE — BHH Suicide Risk Assessment (Addendum)
[facility] Discharge Suicide Risk Assessment ? ? ?Principal Problem: Schizoaffective disorder, bipolar type (HCC) ?Discharge Diagnoses: Principal Problem: ?  Schizoaffective disorder, bipolar type (HCC) ?Active Problems: ?  POTS (postural orthostatic tachycardia syndrome) ?  Subclinical hypothyroidism ?  PTSD (post-traumatic stress disorder) ?  History of OCD (obsessive compulsive disorder) ? ? ?Reason for Admission: Sabrina Bennett is a 21 year old female with a psychiatric history of bipolar disorder, depression, GAD, OCD, ADHD- inattentive type, ARFID, and binge-eating disorder, as well as a medical history of hypothyroidism and POTS who presented to [facility] as a walk-in and admitted voluntarily for worsening AVH with command hallucinations telling her to kill herself as well as passive SI. ? ?Hospital Summary ?During the patient's hospitalization, patient had extensive initial psychiatric evaluation, and follow-up psychiatric evaluations every day. ? ?Psychiatric diagnoses provided upon initial assessment:  ?Schizoaffective disorder, bipolar type ?GAD ?PTSD ?History of OCD ?History of ARFID ? ?Patient's psychiatric medications were adjusted on admission:  ?-Discontinue home Zyprexa due to significant weight gain ?-Discontinue home lithium due to futility and increasing creatinine (1.02 in December, 1.11 on this admission). ?-Discontinue home Prozac and switch to Zoloft 25 mg daily ?-Start Abilify 15 mg daily, as it is a more weight neutral SGA ?-Discontinue trazodone as needed and switch to ramelteon for less alpha blockade effects (patient with soft BP and history of POTS). ?- Continue home metformin 500 mg daily for antipsychotic induced metabolic effects. ?- Continue home topiramate 25 mg twice daily for history of ARFID ? ?During the hospitalization, other adjustments were made to the patient's psychiatric medication regimen:  ?-Abilify was dc due to ineffectiveness  ?- Start and titrated up Geodon to 40 mg BID for worsening  psychosis (command hallucinations to punch the floor).  ?- Increase Zoloft to 50 mg daily for depression ?- Continue Ramelteon 8 mg qhs for insomnia. ?- Continue Metformin 500 mg daily for antipsychotic induced metabolic effects. ?- Continue topiramate 25 mg twice daily for history of ARFID ? ?Gradually, patient started adjusting to milieu.   ?Patient's care was discussed during the interdisciplinary team meeting every day during the hospitalization. ? ?The patient denies having side effects to prescribed psychiatric medication. ? ?The patient reports their target psychiatric symptoms of hallucination, depression, and anxiety responded well to the psychiatric medications, and the patient reports overall benefit other psychiatric hospitalization. Supportive psychotherapy was provided to the patient. The patient also participated in regular group therapy while admitted.  ? ?Labs were reviewed with the patient, and abnormal results were discussed with the patient. ? ?The patient denied having suicidal thoughts more than 48 hours prior to discharge.  Patient denies having homicidal thoughts.  Patient denies having auditory hallucinations.  Patient denies any visual hallucinations.  Patient denies having paranoid thoughts. ? ?The patient is able to verbalize their individual safety plan to this provider. ? ?It is recommended to the patient to continue psychiatric medications as prescribed, after discharge from the hospital.   ? ?It is recommended to the patient to follow up with your outpatient psychiatric provider and PCP. ? ?Discussed with the patient, the impact of alcohol, drugs, tobacco have been there overall psychiatric and medical wellbeing, and total abstinence from substance use was recommended the patient. ? ? ?Total Time spent with patient: 45 minutes ? ?Musculoskeletal: ?Strength & Muscle Tone: within normal limits ?Gait & Station: normal ?Patient leans: N/A ? ?Psychiatric Specialty Exam ? ?Presentation   ?General Appearance: Appropriate for Environment; Casual; Fairly Groomed ? ? ?Eye Contact:Good ? ? ?Speech:Clear  and Coherent; Normal Rate ? ? ?Speech Volume:Normal ? ? ?Handedness:Right ? ? ? ?Mood and Affect  ?Mood:Euthymic ? ? ?Duration of Depression Symptoms: Greater than two weeks ? ? ?Affect:Appropriate; Congruent; Full Range ? ? ? ?Thought Process  ?Thought Processes:Linear ? ? ?Descriptions of Associations:Intact ? ? ?Orientation:Full (Time, Place and Person) ? ? ?Thought Content:Logical ? ? ?History of Schizophrenia/Schizoaffective disorder:No ? ? ?Duration of Psychotic Symptoms:No data recorded ? ?Hallucinations:Hallucinations: None ? ?Ideas of Reference:None ? ? ?Suicidal Thoughts:Suicidal Thoughts: No ?SI Active Intent and/or Plan: -- (none) ?SI Passive Intent and/or Plan: -- (none) ? ?Homicidal Thoughts:Homicidal Thoughts: No ? ? ?Sensorium  ?Memory:Immediate Good; Recent Good; Remote Good ? ? ?Judgment:Good ? ? ?Insight:Good ? ? ? ?Executive Functions  ?Concentration:Good ? ? ?Attention Span:Good ? ? ?Recall:Good ? ? ?Fund of Knowledge:Good ? ? ?Language:Good ? ? ? ?Psychomotor Activity  ?Psychomotor Activity:Psychomotor Activity: Normal ? ? ?Assets  ?Assets:Communication Skills; Desire for Improvement; Housing; Intimacy; Leisure Time; Physical Health; Resilience; Social Support; Talents/Skills ? ? ? ?Sleep  ?Sleep:Sleep: Good ?Number of Hours of Sleep: 7.25 ? ? ?Physical Exam: ?Physical Exam ?Vitals and nursing note reviewed.  ?Constitutional:   ?   Appearance: Normal appearance. She is normal weight.  ?HENT:  ?   Head: Normocephalic and atraumatic.  ?Pulmonary:  ?   Effort: Pulmonary effort is normal.  ?Neurological:  ?   General: No focal deficit present.  ?   Mental Status: She is oriented to person, place, and time.  ? ?Review of Systems  ?Respiratory:  Negative for shortness of breath.   ?Cardiovascular:  Negative for chest pain.  ?Gastrointestinal:  Negative for abdominal pain, constipation,  diarrhea, heartburn, nausea and vomiting.  ?Neurological:  Negative for headaches.  ?Psychiatric/Behavioral:  Negative for depression, hallucinations, memory loss, substance abuse and suicidal ideas. The patient is not nervous/anxious and does not have insomnia.   ? ?Blood pressure 123/75, pulse (!) 109, temperature 97.7 ?F (36.5 ?C), temperature source Oral, resp. rate 17, height 5\' 9"  (1.753 m), weight 107.5 kg, last menstrual period 07/30/2021, SpO2 100 %. Body mass index is 35 kg/m?. ? ?Mental Status Per Nursing Assessment::   ?On Admission:  Suicidal ideation indicated by patient ? ?Demographic Factors:  ?Adolescent or young adult ? ?Loss Factors: ?NA ? ?Historical Factors: ?NA ? ?Risk Reduction Factors:   ?Living with another person, especially a relative, Positive social support, Positive therapeutic relationship, and Positive coping skills or problem solving skills ? ?Continued Clinical Symptoms:  ?More than one psychiatric diagnosis ? ?Cognitive Features That Contribute To Risk:  ?None   ? ?Suicide Risk:  ?Mild:  There are no identifiable suicide plans, no associated intent, mild dysphoria and related symptoms, good self-control (both objective and subjective assessment), few other risk factors, and identifiable protective factors, including available and accessible social support. ? ? Follow-up Information   ? ? [facility] Follow up.   ?Why: You have an appointment with Bradly BienenstockMelissa Davis for therapy services on 09/26/21 at 1:00 pm.  You also have an appointment for medication management services with Dr. Levada SchillingJohn McGraw on 10/06/21 at 10:20 am.  These appointments will be Virtual. ?Contact information: ?[address] ?[place] [address] ? ?Phone: [phone] ? ?  ?  ? ? BEHAVIORAL HEALTH PARTIAL HOSPITALIZATION PROGRAM Follow up on 09/20/2021.   ?Specialty: Behavioral Health ?Why: You are scheduled for an assessment for the PHP on  09/20/21 at 10:00 am. This appointment will last approximately one hour  and will be virtual  via [employer]. PHP is virtual group therapy that runs Mon-Fri from 9am-1pm. Please download the [employer] app prior to the appointment. If you need to cancel or reschedule, please call [phone]

## 2021-09-18 NOTE — Group Note (Signed)
Date:  09/18/2021 ?Time:  11:33 AM ? ?Group Topic/Focus:  ?Orientation:   The focus of this group is to educate the patient on the purpose and policies of crisis stabilization and provide a format to answer questions about their admission.  The group details unit policies and expectations of patients while admitted. ? ? ? ?Participation Level:  Active ? ?Participation Quality:  Appropriate ? ?Affect:  Appropriate ? ?Cognitive:  Appropriate ? ?Insight: Appropriate ? ?Engagement in Group:  Engaged ? ?Modes of Intervention:  Discussion ? ?Additional Comments:   ? ?Reshunda Strider R Sherilyn Windhorst ?09/18/2021, 11:33 AM ? ?

## 2021-09-18 NOTE — Progress Notes (Signed)
Discharge Note:  Patient discharged home with  boyfriend.  Suicide prevention information given and discussed with patient who stated she understood and had no questions.  Patient stated she received all her belongings.  Denied SI and HI.  Denied A/V hallucinations.  Patient stated she received all her belongings, clothing, toiletries, misc items.  Patient stated she appreciated all assistance received from [facility] staff.   ?

## 2021-09-18 NOTE — Plan of Care (Signed)
Nurse discussed anxiety, depression and coping skills with patient.  

## 2021-09-18 NOTE — Plan of Care (Signed)
Notified of patient being unable to fill Geodon due to pharmacy needing to order medication. Ordered Haldol 5 mg BID x4 days to bridge patient while she is waiting for Geodon to arrive at pharmacy. ? ?Park Pope, MD ?PGY1 Resident ?

## 2021-09-18 NOTE — Progress Notes (Signed)
?  [facility] Adult Case Management Discharge Plan : ? ?Will you be returning to the same living situation after discharge:  Yes,  Home  ?At discharge, do you have transportation home?: Yes,  Boyfriend  ?Do you have the ability to pay for your medications: Yes,  [employer]  ? ?Release of information consent forms completed and in the chart;  Patient's signature needed at discharge. ? ?Patient to Follow up at: ? Follow-up Information   ? ? [facility] Follow up on 09/26/2021.   ?Why: You have an appointment with Bradly Bienenstock for therapy services on 09/26/21 at 1:00 pm.  You also have an appointment for medication management services with Dr. Levada Schilling on 10/06/21 at 10:20 am.  These appointments will be Virtual. ?Contact information: ?[address] ?[place], [place] [address] ? ?Phone: [phone] ? ?  ?  ? ? BEHAVIORAL HEALTH PARTIAL HOSPITALIZATION PROGRAM Follow up on 09/20/2021.   ?Specialty: Behavioral Health ?Why: You are scheduled for an assessment for the PHP on  09/20/21 at 10:00 am. This appointment will last approximately one hour and will be virtual via Webex. PHP is virtual group therapy that runs Mon-Fri from 9am-1pm. Please download the [employer] app prior to the appointment. If you need to cancel or reschedule, please call [phone] ?Contact information: ?[address] ?[place] [address] ?[phone] ? ?  ?  ? ?  ?  ? ?  ? ? ?Next level of care provider has access to [facility] Link:yes ? ?Safety Planning and Suicide Prevention discussed: Yes,  with patient and boyfriend  ? ?  ? ?Has patient been referred to the Quitline?: N/A patient is not a smoker ? ?Patient has been referred for addiction treatment: Pt. refused referral ? ?Aram Beecham, LCSWA ?09/18/2021, 9:25 AM ?

## 2021-09-18 NOTE — Discharge Summary (Signed)
Physician Discharge Summary Note ? ?Patient:  Sabrina Bennett is an 21 y.o., female ?MRN:  [id number] ?DOB:  [birth year] ?Patient phone:  [phone] (home)  ?Patient address:   ?[address] ?[place] [address],  ?Total Time spent with patient: 45 minutes ? ?Date of Admission:  09/10/2021 ?Date of Discharge: 09/18/2021 ? ?Reason for Admission:  Sabrina Bennett is a 21 year old female with a psychiatric history of bipolar disorder, depression, GAD, OCD, ADHD- inattentive type, ARFID, and binge-eating disorder, as well as a medical history of hypothyroidism and POTS who presented to [facility] as a walk-in and admitted voluntarily for worsening AVH with command hallucinations telling her to kill herself as well as passive SI. ? ?Principal Problem: Schizoaffective disorder, bipolar type (HCC) ?Discharge Diagnoses: Principal Problem: ?  Schizoaffective disorder, bipolar type (HCC) ?Active Problems: ?  POTS (postural orthostatic tachycardia syndrome) ?  Subclinical hypothyroidism ?  PTSD (post-traumatic stress disorder) ?  History of OCD (obsessive compulsive disorder) ? ? ? ?Past Psychiatric History:  ?Previous Psych Diagnoses:  bipolar disorder, depression, GAD, OCD, ADHD- inattentive type, ARFID, and binge-eating disorder ?Prior inpatient treatment: Denies ?Current/prior outpatient treatment/psychotherapy: Yes ?Prior rehab hx: Denies ?History of suicide: Denies attempt; history of cutting ?History of homicide: Denies ?Psychiatric medication history: Lamictal 100 mg, Zoloft 200 mg, Zyprexa 2.5 mg (14 lb weight gain), Vyvanse, Vraylar 6 mg (since 07/2020), Lithium 300 mg ?Psychiatric medication compliance history: Compliant ?Neuromodulation history: Denies ?Current Psychiatrist and therapist:John Deeann Saint (last seen last month when hydroxyzine was discontinued and discussion to discontinue topiramate) and therapist Bradly Bienenstock (last seen November 2022) through [facility] in [place] ? ? ?Past  Medical History: History reviewed. No pertinent past medical history. History reviewed. No pertinent surgical history. ?Family History: History reviewed. No pertinent family history. ?Family Psychiatric  History:  ?Psych: Mom and younger brother with ADHD; older brother with depression/anxiety ?Psych Rx: Unknown ?SA/HA: Denies ?Substance use family hx: Dad- alcohol use disorder ? ?Social History:  ?Social History  ? ?Substance and Sexual Activity  ?Alcohol Use Yes  ?   ?Social History  ? ?Substance and Sexual Activity  ?Drug Use Never  ?  ?Social History  ? ?Socioeconomic History  ? Marital status: Single  ?  Spouse name: Not on file  ? Number of children: Not on file  ? Years of education: Not on file  ? Highest education level: Not on file  ?Occupational History  ? Not on file  ?Tobacco Use  ? Smoking status: Never  ?  Passive exposure: Never  ? Smokeless tobacco: Never  ?Vaping Use  ? Vaping Use: Never used  ?Substance and Sexual Activity  ? Alcohol use: Yes  ? Drug use: Never  ? Sexual activity: Yes  ?  Birth control/protection: Pill, Condom  ?Other Topics Concern  ? Not on file  ?Social History Narrative  ? Not on file  ? ?Social Determinants of Health  ? ?Financial Resource Strain: Not on file  ?Food Insecurity: Not on file  ?Transportation Needs: Not on file  ?Physical Activity: Not on file  ?Stress: Not on file  ?Social Connections: Not on file  ? ? ?Hospital Course:   ?During the patient's hospitalization, patient had extensive initial psychiatric evaluation, and follow-up psychiatric evaluations every day. ?  ?Psychiatric diagnoses provided upon initial assessment:  ?Schizoaffective disorder, bipolar type ?GAD ?PTSD ?History of OCD ?History of ARFID ?  ?Patient's psychiatric medications were adjusted on admission:  ?-Discontinue home Zyprexa due to significant  weight gain ?-Discontinue home lithium due to futility and increasing creatinine (1.02 in December, 1.11 on this admission). ?-Discontinue home  Prozac and switch to Zoloft 25 mg daily ?-Start Abilify 15 mg daily, as it is a more weight neutral SGA ?-Discontinue trazodone as needed and switch to ramelteon for less alpha blockade effects (patient with soft BP and history of POTS). ?- Continue home metformin 500 mg daily for antipsychotic induced metabolic effects. ?- Continue home topiramate 25 mg twice daily for history of ARFID ?  ?During the hospitalization, other adjustments were made to the patient's psychiatric medication regimen:  ?- Start and titrated up Geodon to 40 mg BID for worsening psychosis (command hallucinations to punch the floor).  ?- Increase Zoloft to 50 mg daily for depression ?- Continue Ramelteon 8 mg qhs for insomnia. ?- Continue Metformin 500 mg daily for antipsychotic induced metabolic effects. ?- Continue topiramate 25 mg twice daily for history of ARFID ?  ?Gradually, patient started adjusting to milieu.   ?Patient's care was discussed during the interdisciplinary team meeting every day during the hospitalization. ?  ?The patient denies having side effects to prescribed psychiatric medication. ?  ?The patient reports their target psychiatric symptoms of hallucination, depression, and anxiety responded well to the psychiatric medications, and the patient reports overall benefit other psychiatric hospitalization. Supportive psychotherapy was provided to the patient. The patient also participated in regular group therapy while admitted.  ?  ?Labs were reviewed with the patient, and abnormal results were discussed with the patient. ?  ?The patient denied having suicidal thoughts more than 48 hours prior to discharge.  Patient denies having homicidal thoughts.  Patient denies having auditory hallucinations.  Patient denies any visual hallucinations.  Patient denies having paranoid thoughts. ?  ?The patient is able to verbalize their individual safety plan to this provider. ?  ?It is recommended to the patient to continue psychiatric  medications as prescribed, after discharge from the hospital.   ?  ?It is recommended to the patient to follow up with your outpatient psychiatric provider and PCP. ?  ?Discussed with the patient, the impact of alcohol, drugs, tobacco have been there overall psychiatric and medical wellbeing, and total abstinence from substance use was recommended the patient. ? ?Physical Findings: ?AIMS: 0 ? ? ?Musculoskeletal: ?Strength & Muscle Tone: within normal limits ?Gait & Station: normal ?Patient leans: N/A ? ? ?Psychiatric Specialty Exam: ? ?Presentation  ?General Appearance: Appropriate for Environment; Casual; Fairly Groomed ? ? ?Eye Contact:Good ? ? ?Speech:Clear and Coherent; Normal Rate ? ? ?Speech Volume:Normal ? ? ?Handedness:Right ? ? ? ?Mood and Affect  ?Mood:Euthymic ? ? ?Affect:Appropriate; Congruent; Full Range ? ? ? ?Thought Process  ?Thought Processes:Linear ? ? ?Descriptions of Associations:Intact ? ? ?Orientation:Full (Time, Place and Person) ? ? ?Thought Content:Logical ? ? ?History of Schizophrenia/Schizoaffective disorder:No ? ? ?Duration of Psychotic Symptoms:No data recorded ? ?Hallucinations:Hallucinations: None ? ? ?Ideas of Reference:None ? ? ?Suicidal Thoughts:Suicidal Thoughts: No ?SI Active Intent and/or Plan: -- (none) ?SI Passive Intent and/or Plan: -- (none) ? ? ?Homicidal Thoughts:Homicidal Thoughts: No ? ? ? ?Sensorium  ?Memory:Immediate Good; Recent Good; Remote Good ? ? ?Judgment:Good ? ? ?Insight:Good ? ? ? ?Executive Functions  ?Concentration:Good ? ? ?Attention Span:Good ? ? ?Recall:Good ? ? ?Fund of Knowledge:Good ? ? ?Language:Good ? ? ? ?Psychomotor Activity  ?Psychomotor Activity:Psychomotor Activity: Normal ? ? ? ?Assets  ?Assets:Communication Skills; Desire for Improvement; Housing; Intimacy; Leisure Time; Physical Health; Resilience; Social Support; Talents/Skills ? ? ? ?Sleep  ?  Sleep:Sleep: Good ?Number of Hours of Sleep: 7.25 ? ? ? ? ?Physical Exam: ?Physical Exam ?Vitals and  nursing note reviewed.  ?Constitutional:   ?   Appearance: Normal appearance. She is normal weight.  ?HENT:  ?   Head: Normocephalic and atraumatic.  ?Pulmonary:  ?   Effort: Pulmonary effort is normal.  ?

## 2021-09-18 NOTE — Progress Notes (Signed)
Patient appears pleasant. Patient denies SI/HI/AVH. Patient complied with morning medication with no reported side effects.  Pt is excited for discharge. Pt reports good sleep and a good appetite. Patient remains safe on Q85min checks and contracts for safety.  ? ? ? ? 09/18/21 0900  ?Psych Admission Type (Psych Patients Only)  ?Admission Status Voluntary  ?Psychosocial Assessment  ?Patient Complaints None  ?Eye Contact Fair  ?Facial Expression Anxious  ?Affect Appropriate to circumstance  ?Speech Logical/coherent  ?Interaction Assertive  ?Motor Activity Fidgety  ?Appearance/Hygiene Unremarkable  ?Behavior Characteristics Appropriate to situation  ?Mood Pleasant  ?Thought Process  ?Coherency WDL  ?Content WDL  ?Delusions None reported or observed  ?Perception WDL  ?Hallucination None reported or observed  ?Confusion None  ?Danger to Self  ?Current suicidal ideation? Denies  ?Agreement Not to Harm Self Yes  ?Description of Agreement verbal  ?Danger to Others  ?Danger to Others None reported or observed  ? ? ?

## 2021-09-20 ENCOUNTER — Telehealth (HOSPITAL_COMMUNITY): Payer: Self-pay | Admitting: Professional

## 2021-09-20 ENCOUNTER — Other Ambulatory Visit (HOSPITAL_COMMUNITY): Payer: 59 | Attending: Psychiatry | Admitting: Professional

## 2021-09-20 DIAGNOSIS — F333 Major depressive disorder, recurrent, severe with psychotic symptoms: Secondary | ICD-10-CM

## 2021-09-20 DIAGNOSIS — F900 Attention-deficit hyperactivity disorder, predominantly inattentive type: Secondary | ICD-10-CM | POA: Insufficient documentation

## 2021-09-20 DIAGNOSIS — F5082 Avoidant/restrictive food intake disorder: Secondary | ICD-10-CM | POA: Insufficient documentation

## 2021-09-20 DIAGNOSIS — F5081 Binge eating disorder: Secondary | ICD-10-CM | POA: Insufficient documentation

## 2021-09-20 DIAGNOSIS — F250 Schizoaffective disorder, bipolar type: Secondary | ICD-10-CM | POA: Insufficient documentation

## 2021-09-20 DIAGNOSIS — F411 Generalized anxiety disorder: Secondary | ICD-10-CM | POA: Insufficient documentation

## 2021-09-20 DIAGNOSIS — E039 Hypothyroidism, unspecified: Secondary | ICD-10-CM | POA: Insufficient documentation

## 2021-09-20 DIAGNOSIS — G90A Postural orthostatic tachycardia syndrome (POTS): Secondary | ICD-10-CM | POA: Insufficient documentation

## 2021-09-20 DIAGNOSIS — Z79899 Other long term (current) drug therapy: Secondary | ICD-10-CM | POA: Insufficient documentation

## 2021-09-20 DIAGNOSIS — R45851 Suicidal ideations: Secondary | ICD-10-CM | POA: Insufficient documentation

## 2021-09-20 DIAGNOSIS — F429 Obsessive-compulsive disorder, unspecified: Secondary | ICD-10-CM | POA: Insufficient documentation

## 2021-09-20 NOTE — Psych (Signed)
Virtual Visit via Video Note ? ?I connected with Sabrina Bennett on 09/20/21 at 10:00 AM EDT by a video enabled telemedicine application and verified that I am speaking with the correct person using two identifiers. ? ?Location: ?Patient: Home ?Provider: Clinical Home Office ?  ?I discussed the limitations of evaluation and management by telemedicine and the availability of in person appointments. The patient expressed understanding and agreed to proceed. ? ?Follow Up Instructions: ? ?  ?I discussed the assessment and treatment plan with the patient. The patient was provided an opportunity to ask questions and all were answered. The patient agreed with the plan and demonstrated an understanding of the instructions. ?  ?The patient was advised to call back or seek an in-person evaluation if the symptoms worsen or if the condition fails to improve as anticipated. ? ?I provided 60 minutes of non-face-to-face time during this encounter. ? ? ?Quinn Axe, [facility] ? ? ? ? ?Comprehensive Clinical Assessment (CCA) Note ? ?09/20/2021 ?Sabrina Bennett ?[id number] ? ?Chief Complaint:  ?Chief Complaint  ?Patient presents with  ? Depression  ? Anxiety  ? Follow-up  ?  Hospital D/C  ? ?Visit Diagnosis: Schizoaffective  ? ? ?CCA Screening, Triage and Referral (STR) ? ?Patient Reported Information ?How did you hear about [place]? Hospital Discharge ? ?Referral name: [facility] ? ?Referral phone number: No data recorded ? ?Whom do you see for routine medical problems? I don't have a doctor ? ?Practice/Facility Name: No data recorded ?Practice/Facility Phone Number: No data recorded ?Name of Contact: No data recorded ?Contact Number: No data recorded ?Contact Fax Number: No data recorded ?Prescriber Name: No data recorded ?Prescriber Address (if known): No data recorded ? ?What Is the Reason for Your Visit/Call Today? depression, anxiety, hearing voices, schizoaffective d/o; PTSD; OCD ? ?How Long Has This Been Causing You Problems? > than 6  months ? ?What Do You Feel Would Help You the Most Today? Treatment for Depression or other mood problem ? ? ?Have You Recently Been in Any Inpatient Treatment (Hospital/Detox/Crisis Center/28-Day Program)? Yes ? ?Name/Location of Program/Hospital:[facility] ? ?How Long Were You There? 8 days ? ?When Were You Discharged? 09/18/21 ? ? ?Have You Ever Received Services From [employer] Before? Yes ? ?Who Do You See at [facility]? No data recorded ? ?Have You Recently Had Any Thoughts About Hurting Yourself? No ? ?Are You Planning to Commit Suicide/Harm Yourself At This time? No ? ? ?Have you Recently Had Thoughts About Hurting Someone Karolee Ohs? No ? ?Explanation: No data recorded ? ?Have You Used Any Alcohol or Drugs in the Past 24 Hours? No ? ?How Long Ago Did You Use Drugs or Alcohol? No data recorded ?What Did You Use and How Much? No data recorded ? ?Do You Currently Have a Therapist/Psychiatrist? No ? ?Name of Therapist/Psychiatrist: "In the process of getting new ones" ? ? ?Have You Been Recently Discharged From Any Office Practice or Programs? No ? ?Explanation of Discharge From Practice/Program: No data recorded ? ?  ?CCA Screening Triage Referral Assessment ?Type of Contact: Tele-Assessment ? ?Is this Initial or Reassessment? Initial Assessment ? ?Date Telepsych consult ordered in [facility]:  No data recorded ?Time Telepsych consult ordered in [facility]:  No data recorded ? ?Patient Reported Information Reviewed? No data recorded ?Patient Left Without Being Seen? No data recorded ?Reason for Not Completing Assessment: No data recorded ? ?Collateral Involvement: chart review ? ? ?Does Patient Have a Automotive engineer Guardian? No data recorded ?Name and Contact of Legal Guardian:  No data recorded ?If Minor and Not Living with Parent(s), Who has Custody? NA ? ?Is CPS involved or ever been involved? Never ? ?Is APS involved or ever been involved? Never ? ? ?Patient Determined To Be At Risk for Harm To Self or Others Based on  Review of Patient Reported Information or Presenting Complaint? No ? ?Method: No data recorded ?Availability of Means: No data recorded ?Intent: No data recorded ?Notification Required: No data recorded ?Additional Information for Danger to Others Potential: No data recorded ?Additional Comments for Danger to Others Potential: No data recorded ?Are There Guns or Other Weapons in Your Home? No data recorded ?Types of Guns/Weapons: No data recorded ?Are These Weapons Safely Secured?                            No data recorded ?Who Could Verify You Are Able To Have These Secured: No data recorded ?Do You Have any Outstanding Charges, Pending Court Dates, Parole/Probation? No data recorded ?Contacted To Inform of Risk of Harm To Self or Others: No data recorded ? ?Location of Assessment: Other (comment) ? ? ?Does Patient Present under Involuntary Commitment? No ? ?IVC Papers Initial File Date: No data recorded ? [place] ? ? ?Patient Currently Receiving the Following Services: Not Receiving Services ? ? ?Determination of Need: Urgent (48 hours) ? ? ?Options For Referral: Partial Hospitalization ? ? ? ? ?CCA Biopsychosocial ?Intake/Chief Complaint:  [facility] due to SI and hearing voices. Pt reports the following stressors: 1) Mental Health: Pt reports she hears voices that tell her to harm herself. She reports voices are unknown now. ?The voices used to be my own. Now, it?s like multiple voices that I don?t recognize.? Last time experienced was last night. Pt also reports VH: reports seeing someone sleeping in her bed with her last night and shadow figures in the peripheral. 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 3) School: Pt reports she is looking into grad schools since she will graduate in May 2024 from [employer] in [employer]. Pt reports history of treatment includes 2 hospitalizations (04/2021 for SI with plan to shoot self- pt denies  access to gun at this time, 09/2021) and 2 years of individual counseling ([facility] [place] and psychiatry (Dr. Levada SchillingJohn McGraw). Pt reports history of NSSIB of cutting for at least 6 months. Pt reports last time was 1.5 years ago. Pt reports urges at time. Pt reports supports include: Mom, boyfriend, Older brother; BFF. Pt reports family history includes older brother with depression and anxiety. Pt reports medical diagnoses include POTS and hypothyroidism. Pt endorses passive SI/AVH; denies HI. ? ?Current Symptoms/Problems: negative self-talk; AVH; passive SI; increased tearfulness; overwhelmed; decreased ADLs (showering, brushing teeth, cleaning); increased impulsivity; ? ? ?Patient Reported Schizophrenia/Schizoaffective Diagnosis in Past: No ? ? ?Strengths: Pt is willing to participate in treatment ? ?Preferences: to feel bette ? ?Abilities: can attend and participate in treatment ? ? ?Type of Services Patient Feels are Needed: PHP ? ? ?Initial Clinical Notes/Concerns: No data recorded ? ?Mental Health Symptoms ?Depression:   ?Change in energy/activity; Fatigue; Hopelessness; Tearfulness; Weight gain/loss; Irritability ?  ?Duration of Depressive symptoms:  ?Greater than two weeks ?  ?Mania:   ?Change in energy/activity; Racing thoughts; Recklessness ?  ?Anxiety:    ?Difficulty concentrating; Restlessness; Irritability ?  ?Psychosis:   ?Hallucinations ?  ?Duration of  Psychotic symptoms:  ?Greater than six months ?  ?Trauma:   ?None ?  ?Obsessions:   ?None ?  ?Compulsions:   ?None ?  ?Inattention:   ?None ?  ?Hyperactivity/Impulsivity:   ?N/A ?  ?Oppositional/Defiant Behaviors:   ?None ?  ?Emotional Irregularity:   ?Chronic feelings of emptiness; Mood lability; Intense/unstable relationships ?  ?Other Mood/Personality Symptoms:   ?NA ?  ? ?Mental Status Exam ?Appearance and self-care  ?Stature:   ?Average ?  ?Weight:   ?Average weight ?  ?Clothing:   ?Neat/clean; Casual ?  ?Grooming:    ?Normal ?  ?Cosmetic use:   ?None ?  ?Posture/gait:   ?Normal ?  ?Motor activity:   ?Not Remarkable ?  ?Sensorium  ?Attention:   ?Normal ?  ?Concentration:   ?Anxiety interferes ?  ?Orientation:   ?X5 ?  ?

## 2021-09-21 ENCOUNTER — Other Ambulatory Visit (HOSPITAL_COMMUNITY): Payer: 59

## 2021-09-21 ENCOUNTER — Encounter (HOSPITAL_COMMUNITY): Payer: Self-pay

## 2021-09-21 ENCOUNTER — Other Ambulatory Visit (HOSPITAL_COMMUNITY): Payer: 59 | Admitting: Licensed Clinical Social Worker

## 2021-09-21 DIAGNOSIS — Z79899 Other long term (current) drug therapy: Secondary | ICD-10-CM | POA: Diagnosis not present

## 2021-09-21 DIAGNOSIS — F900 Attention-deficit hyperactivity disorder, predominantly inattentive type: Secondary | ICD-10-CM | POA: Diagnosis not present

## 2021-09-21 DIAGNOSIS — F333 Major depressive disorder, recurrent, severe with psychotic symptoms: Secondary | ICD-10-CM

## 2021-09-21 DIAGNOSIS — R4589 Other symptoms and signs involving emotional state: Secondary | ICD-10-CM

## 2021-09-21 DIAGNOSIS — F250 Schizoaffective disorder, bipolar type: Secondary | ICD-10-CM

## 2021-09-21 DIAGNOSIS — E039 Hypothyroidism, unspecified: Secondary | ICD-10-CM | POA: Diagnosis not present

## 2021-09-21 DIAGNOSIS — G90A Postural orthostatic tachycardia syndrome (POTS): Secondary | ICD-10-CM | POA: Diagnosis not present

## 2021-09-21 DIAGNOSIS — F5081 Binge eating disorder: Secondary | ICD-10-CM | POA: Diagnosis not present

## 2021-09-21 DIAGNOSIS — F429 Obsessive-compulsive disorder, unspecified: Secondary | ICD-10-CM | POA: Diagnosis not present

## 2021-09-21 DIAGNOSIS — F5082 Avoidant/restrictive food intake disorder: Secondary | ICD-10-CM | POA: Diagnosis not present

## 2021-09-21 DIAGNOSIS — R45851 Suicidal ideations: Secondary | ICD-10-CM | POA: Diagnosis not present

## 2021-09-21 DIAGNOSIS — F411 Generalized anxiety disorder: Secondary | ICD-10-CM | POA: Diagnosis not present

## 2021-09-21 NOTE — Therapy (Signed)
[facility] PART[facility] H[facility] PROGRAM [address] [place], [place], [address] Phone: [phone]   Fax:  [phone]  Occupational Therapy Evaluation Virtual Visit via Video Note  I connected with Sabrina BolognaSabrina Kang on 09/21/21 at  8:00 AM EDT by a video enabled telemedicine application and verified that I am speaking with the correct person using two identifiers.  Location: Patient: home Provider: office   I discussed the limitations of evaluation and management by telemedicine and the availability of in person appointments. The patient expressed understanding and agreed to proceed.    The patient was advised to call back or seek an in-person evaluation if the symptoms worsen or if the condition fails to improve as anticipated.  I provided 55 minutes of non-face-to-face time during this encounter.   Patient Details  Name: Sabrina BolognaSabrina Bennett MRN: [id number] Date of Birth: 11/30/2000 No data recorded  Encounter Date: 09/21/2021   OT End of Session - 09/21/21 2306     Visit Number 1    Number of Visits 20    Date for OT Re-Evaluation 10/21/21    Authorization Type [employer]    Authorization Time Period working on OT verification benefits currently             History reviewed. No pertinent past medical history.  History reviewed. No pertinent surgical history.  There were no vitals filed for this visit.   Subjective Assessment - 09/21/21 2303     Currently in Pain? No/denies    Pain Score 0-No pain              OT Assessment  Diagnosis: MDD, BPD, OCD, Panic Disorder, Schizoaffective disorder  Past medical history/referral information: NA Living situation: w/ s/o, 1st floor apartment ADLs: independent overall Work: part time at daycare Leisure: minimal  Social support: moderate Struggles: coping skills, routines, time mgmt OT goal: improve psychosocial skills, time mgmt and routines / goal setting   OCAIRS  Mental Health Interview Summary of Client Scores:  Facilitates participation in occupation Allows participation in occupation Inhibits participation in occupation Restricts participation in occupation Comments:  Roles   X    Habits   X    Personal Causation  X     Values   X    Interests  X     Skills  X     Short-Term Goals   X    Long-term Goals   X    Interpretation of Past Experiences   X    Physical Environment  X     Social Environment  X     Readiness for Change   X      Need for Occupational Therapy:          2 Need for OT intervention indicated to restore/improve participation      Assessment:  Patient demonstrates behavior that INHIBITS participation in occupation.  Patient will benefit from occupational therapy intervention in order to improve time management, financial management, stress management, job readiness skills, social skills, and health management skills in preparation to return to full time community living and to be a productive community member.    Plan:  Patient will participate in skilled occupational therapy sessions individually or in a group setting to improve coping skills, psychosocial skills, and emotional skills required to return to prior level of function. Treatment will be 4-5 times per week for 4 weeks.     Group Session:  S: "I've been having trouble with voices  lately, that has been a pretty big interference in my life"  O: During the group session, the participants discussed the importance of having a growth mindset and shared personal stories of how they have bounced back from challenging situations. The cognitive domain was addressed through the presentation of a PowerPoint on resiliency, which included information on the cognitive-behavioral strategies for building resiliency, such as positive self-talk and reframing negative thoughts. The affective domain was addressed as participants were encouraged to reflect on their emotional responses to  challenging situations and to identify effective coping strategies. The patient, who was an active participant, shared personal experiences and coping strategies that worked for them, demonstrating an understanding of the affective domain. The psychomotor domain was also addressed as participants were encouraged to reflect on their physical reactions to stress and to identify effective stress-reducing techniques, such as deep breathing exercises and progressive muscle relaxation. The patient demonstrated an understanding of the psychomotor domain by sharing personal experiences of using relaxation techniques to manage stress. Overall, the session appeared to address each of the three domains, and the patient demonstrated an understanding of each domain through active participation in the group discussion.  A: : The patient demonstrated an understanding of resiliency and appeared to be motivated to continue building their own resiliency skills. The therapist will continue to encourage the patient to share their experiences and strategies in future group sessions.  P: Continue to attend PHP OT group sessions 5x week for 4 weeks to promote daily structure, social engagement, and opportunities to develop and utilize adaptive strategies to maximize functional performance in preparation for safe transition and integration back into school, work, and the community. Plan to address topic of Finding Purpose for Improved ADLs in next OT group session.                  OT Education - 09/21/21 2303     Education Details OT Role and Group process // Building Resilience in Adults with Depression and Anxiety 2/2    Person(s) Educated Patient    Methods Explanation;Handout    Comprehension Verbalized understanding              OT Short Term Goals - 09/21/21 2308       OT SHORT TERM GOAL #1   Title Pt will be independent with learned psychosocial skills in order to successfully engage in  community re-entry at time of discharge    Time 4    Period Weeks    Status New    Target Date 10/21/21      OT SHORT TERM GOAL #2   Title pt will identify at least three new coping skills for stress mgmt for successful community re-entry at time of discharge                      Plan - 09/21/21 2307     OT Occupational Profile and History Problem Focused Assessment - Including review of records relating to presenting problem    Occupational performance deficits (Please refer to evaluation for details): IADL's;Rest and Sleep;Education;Work;Leisure;Social Participation    Psychosocial Skills Coping Strategies;Habits;Interpersonal Interaction;Routines and Behaviors    Rehab Potential Good    Clinical Decision Making Limited treatment options, no task modification necessary    Comorbidities Affecting Occupational Performance: None    Modification or Assistance to Complete Evaluation  No modification of tasks or assist necessary to complete eval    OT Frequency 5x / week  OT Duration 4 weeks    OT Treatment/Interventions Coping strategies training;Psychosocial skills training;Patient/family education    Consulted and Agree with Plan of Care Patient             Patient will benefit from skilled therapeutic intervention in order to improve the following deficits and impairments:       Psychosocial Skills: Coping Strategies, Habits, Interpersonal Interaction, Routines and Behaviors   Visit Diagnosis: Difficulty coping  Schizoaffective disorder, bipolar type (HCC)  Severe episode of recurrent major depressive disorder, with psychotic features [facility])    Problem List Patient Active Problem List   Diagnosis Date Noted   Schizoaffective disorder, bipolar type (HCC) 09/11/2021   History of OCD (obsessive compulsive disorder) 09/11/2021   MDD (major depressive disorder), recurrent episode, severe (HCC) 09/10/2021   Panic disorder 05/04/2021   Bipolar 1 disorder,  depressed, severe (HCC) 05/03/2021   PTSD (post-traumatic stress disorder) 05/03/2021   OCD (obsessive compulsive disorder) 05/03/2021   Generalized anxiety disorder 12/02/2020   Major depressive disorder 12/02/2020   POTS (postural orthostatic tachycardia syndrome) 12/02/2020   Vitamin D deficiency 12/02/2020   Subclinical hypothyroidism 05/17/2017    Ted Mcalpine, OT 09/21/2021, 11:23 PM  Kerrin Champagne, OT   [facility] HOSPITALIZATION PROGRAM [address] [place], [place], [address] Phone: [phone]   Fax:  [phone]  Name: Sabrina Bennett MRN: [id number] Date of Birth: 05-31-2000

## 2021-09-21 NOTE — Plan of Care (Signed)
  Problem: Depression CCP Problem  1  Goal: STG: Sabrina Bennett WILL ATTEND AT LEAST 80% OF SCHEDULED PHP SESSIONS Outcome: Not Applicable Goal: STG: Sabrina Bennett WILL COMPLETE AT LEAST 80% OF ASSIGNED HOMEWORK Outcome: Not Applicable Goal: STG: Reduce overall depression score by a minimum of 25% on the Patient Health Questionnaire (PHQ-9) or the Montgomery-Asberg Depression Rating Scale (MADRS) Outcome: Not Applicable Goal: STG: Sabrina Bennett WILL IDENTIFY AT LEAST 3 COGNITIVE PATTERNS AND BELIEFS THAT SUPPORT DEPRESSION Outcome: Not Applicable   Pt verbally agrees to treatment plan.

## 2021-09-22 ENCOUNTER — Encounter (HOSPITAL_COMMUNITY): Payer: Self-pay

## 2021-09-22 ENCOUNTER — Other Ambulatory Visit (HOSPITAL_COMMUNITY): Payer: 59

## 2021-09-22 ENCOUNTER — Other Ambulatory Visit (HOSPITAL_COMMUNITY): Payer: 59 | Admitting: Licensed Clinical Social Worker

## 2021-09-22 ENCOUNTER — Ambulatory Visit (HOSPITAL_COMMUNITY)
Admission: RE | Admit: 2021-09-22 | Discharge: 2021-09-22 | Disposition: A | Discharge status: Home / Self Care | Payer: 59 | Attending: Psychiatry | Admitting: Psychiatry

## 2021-09-22 DIAGNOSIS — R4589 Other symptoms and signs involving emotional state: Secondary | ICD-10-CM

## 2021-09-22 DIAGNOSIS — F250 Schizoaffective disorder, bipolar type: Secondary | ICD-10-CM

## 2021-09-22 NOTE — Therapy (Signed)
[facility] PARTIAL HOSPITALIZATION PROGRAM [address] [place], [place], [address] Phone: [phone]   Fax:  [phone]  Occupational Therapy Treatment Virtual Visit via Video Note  I connected with Sabrina Bennett on 09/22/21 at  8:00 AM EDT by a video enabled telemedicine application and verified that I am speaking with the correct person using two identifiers.  Location: Patient: home Provider: office   I discussed the limitations of evaluation and management by telemedicine and the availability of in person appointments. The patient expressed understanding and agreed to proceed.    The patient was advised to call back or seek an in-person evaluation if the symptoms worsen or if the condition fails to improve as anticipated.  I provided 55 minutes of non-face-to-face time during this encounter.   Patient Details  Name: Sabrina Bennett MRN: [id number] Date of Birth: [birth year] No data recorded  Encounter Date: 09/22/2021   OT End of Session - 09/22/21 1911     Visit Number 2    Number of Visits 20    Date for OT Re-Evaluation 10/21/21    Authorization Type [employer]    Authorization Time Period working on OT verification benefits currently    OT Start Time 1200    OT Stop Time 1255    OT Time Calculation (min) 55 min    Equipment Utilized During Treatment webex / Power Point    Activity Tolerance Patient tolerated treatment well    Behavior During Therapy WFL for tasks assessed/performed             History reviewed. No pertinent past medical history.  History reviewed. No pertinent surgical history.  There were no vitals filed for this visit.   Subjective Assessment - 09/22/21 1911     Currently in Pain? No/denies    Pain Score 0-No pain              Group Session:  S: "Having a rough day"  O: In this group therapy session, the objective was to explore the multifaceted concept of purpose using  insights from occupational therapy, anthropology, psychology, and life coaching. The participants engaged in a deep exploration of the innate human impulse for purposeful engagement and the impact of purpose on mental and emotional well-being. The discussion centered around the challenges of modern life, including feelings of isolation despite technological advancements, the connection between purposelessness and depression/anxiety, and the importance of occupational roles in achieving fulfillment. Strategies were shared to identify areas of life where purpose is lacking and to develop immediate and actionable plans to overcome these challenges. The participants gained valuable insights into the intersection of purpose and mental health, as well as practical tools to navigate their personal journeys towards purpose-driven lives. Overall, the session fostered a sense of empowerment and inspired the participants to take proactive steps towards aligning their lives with their true purpose.  A: During the group therapy session, Patient exhibited a lower level of active participation. They appeared more reserved and less engaged in the discussions surrounding purpose. Patient listened attentively to others' contributions but did not actively share personal insights or experiences. Their level of interaction suggested a potential challenge in connecting with the concept of purpose or expressing their thoughts and feelings openly. Patient's limited participation could be indicative of potential barriers or internal conflicts hindering their engagement. Further exploration is needed to understand the underlying factors contributing to their reduced level of participation and to facilitate their active involvement in future sessions.  P: Continue to attend PHP OT group sessions 5x week for 4 weeks to promote daily structure, social engagement, and opportunities to develop and utilize adaptive strategies to maximize  functional performance in preparation for safe transition and integration back into school, work, and the community. Plan to address topic of Unveiling the Path to Personal Fulfillment / Discovering Purpose 2/2 in next OT group session.                     OT Education - 09/22/21 1911     Education Details Unveiling the Path to Personal Fulfillment / Discovering Purpose 1/2    Person(s) Educated Patient    Methods Explanation;Handout    Comprehension Verbalized understanding              OT Short Term Goals - 09/21/21 2308       OT SHORT TERM GOAL #1   Title Pt will be independent with learned psychosocial skills in order to successfully engage in community re-entry at time of discharge    Time 4    Period Weeks    Status New    Target Date 10/21/21      OT SHORT TERM GOAL #2   Title pt will identify at least three new coping skills for stress mgmt for successful community re-entry at time of discharge                      Plan - 09/22/21 1912     Psychosocial Skills Coping Strategies;Habits;Interpersonal Interaction;Routines and Behaviors             Patient will benefit from skilled therapeutic intervention in order to improve the following deficits and impairments:       Psychosocial Skills: Coping Strategies, Habits, Interpersonal Interaction, Routines and Behaviors   Visit Diagnosis: Difficulty coping    Problem List Patient Active Problem List   Diagnosis Date Noted   Schizoaffective disorder, bipolar type (HCC) 09/11/2021   History of OCD (obsessive compulsive disorder) 09/11/2021   MDD (major depressive disorder), recurrent episode, severe (HCC) 09/10/2021   Panic disorder 05/04/2021   Bipolar 1 disorder, depressed, severe (HCC) 05/03/2021   PTSD (post-traumatic stress disorder) 05/03/2021   OCD (obsessive compulsive disorder) 05/03/2021   Generalized anxiety disorder 12/02/2020   Major depressive disorder 12/02/2020    POTS (postural orthostatic tachycardia syndrome) 12/02/2020   Vitamin D deficiency 12/02/2020   Subclinical hypothyroidism 05/17/2017    Ted Mcalpine, OT 09/22/2021, 7:12 PM  Kerrin Champagne, OT   [facility] HOSPITALIZATION PROGRAM [address] [place], [place], [address] Phone: [phone]   Fax:  [phone]  Name: Sabrina Bennett MRN: [id number] Date of Birth: [birth year]

## 2021-09-22 NOTE — BH Assessment (Incomplete)
Comprehensive Clinical Assessment (CCA) Note  09/22/2021 Sabrina Bennett [id number]  Disposition: Lindon Romp, NP, patient does not meet inpatient criteria. Patient will follow up with Partial Hospital on tomorrow AM.   The patient demonstrates the following risk factors for suicide: Chronic risk factors for suicide include: {Chronic Risk Factors for Suicide:30414011}. Acute risk factors for suicide include: {Acute Risk Factors for NL:6244280. Protective factors for this patient include: {Protective Factors for Suicide FR:7288263. Considering these factors, the overall suicide risk at this point appears to be {Desc; low/moderate/high:110033}. Patient {ACTION; IS/IS GI:087931 appropriate for outpatient follow up.  Flowsheet Row Counselor from 09/20/2021 in [place] Admission (Discharged) from OP Visit from 09/10/2021 in [place] [facility] ED from 09/09/2021 in [place] CATEGORY Moderate Risk High Risk High Risk      Patient was inpatient at [facility] 09/10/2021 to 09/18/2021. Patient states that she has been taking medications as prescribed since discharge. Patient started PHP program at [facility] on 09/21/2021 and has plans to attend session on 09/22/2021.   Chief Complaint:  Chief Complaint  Patient presents with   Psychiatric Evaluation   Visit Diagnosis:  Major depressive disorder   CCA Screening, Triage and Referral (STR)  Patient Reported Information How did you hear about [place]? Hospital Discharge  What Is the Reason for Your Visit/Call Today? depression, anxiety, hearing voices, schizoaffective d/o; PTSD; OCD  How Long Has This Been Causing You Problems? > than 6 months  What Do You Feel Would Help You the Most Today? Treatment for Depression or other mood problem   Have You Recently Had Any Thoughts About Hurting Yourself? No  Are You Planning to Commit  Suicide/Harm Yourself At This time? No   Have you Recently Had Thoughts About [place]? No  Are You Planning to Harm Someone at This Time? No  Explanation: No data recorded  Have You Used Any Alcohol or Drugs in the Past 24 Hours? No  How Long Ago Did You Use Drugs or Alcohol? No data recorded What Did You Use and How Much? No data recorded  Do You Currently Have a Therapist/Psychiatrist? No  Name of Therapist/Psychiatrist: "In the process of getting new ones"   Have You Been Recently Discharged From Any Office Practice or Programs? No  Explanation of Discharge From Practice/Program: No data recorded    CCA Screening Triage Referral Assessment Type of Contact: walk-in Telemedicine Service Delivery:   Is this Initial or Reassessment? Initial Assessment  Date Telepsych consult ordered in [facility]:  No data recorded Time Telepsych consult ordered in [facility]:  No data recorded Location of Assessment: Other (comment)  Provider Location: Other (comment)   Collateral Involvement: chart review   Does Patient Have a [place]? No data recorded Name and Contact of Legal Guardian: No data recorded If Minor and Not Living with Parent(s), Who has Custody? NA  Is CPS involved or ever been involved? Never  Is APS involved or ever been involved? Never   Patient Determined To Be At Risk for Harm To Self or Others Based on Review of Patient Reported Information or Presenting Complaint? No  Method: No data recorded Availability of Means: No data recorded Intent: No data recorded Notification Required: No data recorded Additional Information for Danger to Others Potential: No data recorded Additional Comments for Danger to Others Potential: No data recorded Are There Guns or Other Weapons in Your Home? No data recorded Types of  Guns/Weapons: No data recorded Are These Weapons Safely Secured?                            No data recorded Who Could Verify  You Are Able To Have These Secured: No data recorded Do You Have any Outstanding Charges, Pending Court Dates, Parole/Probation? No data recorded Contacted To Inform of Risk of Harm To Self or Others: No data recorded   Does Patient Present under Involuntary Commitment? No  IVC Papers Initial File Date: No data recorded  [place] of Residence: [place]   Patient Currently Receiving the Following Services: Not Receiving Services   Determination of Need: Urgent (48 hours)   Options For Referral: Partial Hospitalization     CCA Biopsychosocial Patient Reported Schizophrenia/Schizoaffective Diagnosis in Past: No   Strengths: Pt is willing to participate in treatment   Mental Health Symptoms Depression:   Change in energy/activity; Fatigue; Hopelessness; Tearfulness; Weight gain/loss; Irritability   Duration of Depressive symptoms:    Mania:   Change in energy/activity; Racing thoughts; Recklessness   Anxiety:    Difficulty concentrating; Restlessness; Irritability   Psychosis:   Hallucinations   Duration of Psychotic symptoms:  Duration of Psychotic Symptoms: Greater than six months   Trauma:   None   Obsessions:   None   Compulsions:   None   Inattention:   None   Hyperactivity/Impulsivity:   N/A   Oppositional/Defiant Behaviors:   None   Emotional Irregularity:   Chronic feelings of emptiness; Mood lability; Intense/unstable relationships   Other Mood/Personality Symptoms:   NA    Mental Status Exam Appearance and self-care  Stature:   Average   Weight:   Average weight   Clothing:   Neat/clean; Casual   Grooming:   Normal   Cosmetic use:   None   Posture/gait:   Normal   Motor activity:   Not Remarkable   Sensorium  Attention:   Normal   Concentration:   Anxiety interferes   Orientation:   X5   Recall/memory:   Normal   Affect and Mood  Affect:   Anxious; Depressed   Mood:   Anxious; Depressed   Relating   Eye contact:   Normal   Facial expression:   Anxious; Depressed   Attitude toward examiner:   Cooperative   Thought and Language  Speech flow:  Clear and Coherent   Thought content:   Appropriate to Mood and Circumstances   Preoccupation:   None   Hallucinations:   Auditory; Visual; Command (Comment) (Pt reports voices tell her to hurt herself)   Organization:  No data recorded  [employer] of Knowledge:   Average   Intelligence:   Average   Abstraction:   Normal   Judgement:   Fair   Art therapist:   Realistic   Insight:   Fair   Decision Making:   Normal; Impulsive; Vacilates   Social Functioning  Social Maturity:   Responsible   Social Judgement:   Normal   Stress  Stressors:   Family conflict; Work; Youth worker; Illness   Coping Ability:   Programme researcher, broadcasting/film/video Deficits:   Environmental health practitioner; Self-care   Supports:   Family; Friends/Service system     Religion:    Leisure/Recreation:    Exercise/Diet:     CCA Employment/Education Employment/Work Situation:    Education:     CCA Family/Childhood History Family and Relationship History:    Childhood History:  Child/Adolescent Assessment:     CCA Substance Use Alcohol/Drug Use:                           ASAM's:  Six Dimensions of Multidimensional Assessment  Dimension 1:  Acute Intoxication and/or Withdrawal Potential:      Dimension 2:  Biomedical Conditions and Complications:      Dimension 3:  Emotional, Behavioral, or Cognitive Conditions and Complications:     Dimension 4:  Readiness to Change:     Dimension 5:  Relapse, Continued use, or Continued Problem Potential:     Dimension 6:  Recovery/Living Environment:     ASAM Severity Score:    ASAM Recommended Level of Treatment:     Substance use Disorder (SUD)    Recommendations for Services/Supports/Treatments:    Discharge Disposition:    DSM5 Diagnoses: Patient  Active Problem List   Diagnosis Date Noted   Schizoaffective disorder, bipolar type ([place]) 09/11/2021   History of OCD (obsessive compulsive disorder) 09/11/2021   MDD (major depressive disorder), recurrent episode, severe ([place]) 09/10/2021   Panic disorder 05/04/2021   Bipolar 1 disorder, depressed, severe ([place]) 05/03/2021   PTSD (post-traumatic stress disorder) 05/03/2021   OCD (obsessive compulsive disorder) 05/03/2021   Generalized anxiety disorder 12/02/2020   Major depressive disorder 12/02/2020   POTS (postural orthostatic tachycardia syndrome) 12/02/2020   Vitamin D deficiency 12/02/2020   Subclinical hypothyroidism 05/17/2017     Referrals to Alternative Service(s): Referred to Alternative Service(s):   Place:   Date:   Time:    Referred to Alternative Service(s):   Place:   Date:   Time:    Referred to Alternative Service(s):   Place:   Date:   Time:    Referred to Alternative Service(s):   Place:   Date:   Time:     Venora Maples, [facility]

## 2021-09-22 NOTE — H&P (Signed)
Behavioral Health Medical Screening Exam  Sabrina Bennett is a 21 y.o. female with a psychiatric history of bipolar disorder, depression, GAD, OCD, ADHD- inattentive type, ARFID, and binge-eating disorder, as well as a medical history of hypothyroidism and POTS who presents to [facility] as a walk-in due to AVH. Patient is accompanied by her boyfriend and mother. Patient's boyfriend participates in the assessment with her permission. Patient reports that 2 days ago she started hearing voices that tell her to hurt herself or they will kill her. She reports that she last experienced VH of shadows two days ago. When asked about current SI, patient states "little bit." She denies suicidal intent or plan.   Patient was inpatient at [facility] 09/10/2021 to 09/18/2021. Patient states that she has been taking medications as prescribed since discharge. Patient started PHP program at [facility] on 09/21/2021 and has plans to attend session on 09/22/2021.   Patient lives in [place] with her boyfriend. She is a Consulting civil engineer at [employer].   On evaluation patient is alert and oriented x 4, pleasant, and cooperative. Eye contact is good. Speech is clear and coherent. Mood is depressed. Patient is noted to be smiling appropriately and laughing with her boyfriend. Thought process is coherent and thought content is logical.  Patient reports that 2 days ago she started hearing voices that tell her to hurt herself or they will kill her. She reports that she last experienced VH of shadows two days ago. No indication that patient is responding to internal stimuli. No evidence of delusional thought content. Denies suicidal intent or plan. Denies homicidal ideations. Denies substance abuse.     Total Time spent with patient: 15 minutes  Psychiatric Specialty Exam:  Presentation  General Appearance: Appropriate for Environment; Fairly Groomed  Eye Contact:Good  Speech:Clear and Coherent; Normal  Rate  Speech Volume:Normal  Handedness:Right   Mood and Affect  Mood:Depressed  Affect:Other (comment) (patient smiling appropiately. Laughing with boyfriend.)   Thought Process  Thought Processes:Linear; Coherent  Descriptions of Associations:Intact  Orientation:Full (Time, Place and Person)  Thought Content:Logical  History of Schizophrenia/Schizoaffective disorder:No  Duration of Psychotic Symptoms:Greater than six months  Hallucinations:Hallucinations: Auditory; Visual Description of Auditory Hallucinations: voices tell her to hurt herself or they will kill her Description of Visual Hallucinations: shadows-2 days ago  Ideas of Reference:None  Suicidal Thoughts:Suicidal Thoughts: Yes, Passive (patient states "little bit". Denies intent/plan.) SI Passive Intent and/or Plan: Without Intent; Without Plan  Homicidal Thoughts:Homicidal Thoughts: No   Sensorium  Memory:Immediate Good; Recent Good; Remote Good  Judgment:Good  Insight:Fair   Executive Functions  Concentration:Good  Attention Span:Good  Recall:Good  Fund of Knowledge:Good  Language:Good   Psychomotor Activity  Psychomotor Activity:Psychomotor Activity: Normal   Assets  Assets:Communication Skills; Desire for Improvement; Financial Resources/Insurance; Housing; Physical Health; Social Support; Vocational/Educational   Sleep  Sleep:Sleep: Fair    Physical Exam: Physical Exam Constitutional:      General: She is not in acute distress.    Appearance: She is not ill-appearing, toxic-appearing or diaphoretic.  HENT:     Right Ear: External ear normal.     Left Ear: External ear normal.  Eyes:     General:        Right eye: No discharge.        Left eye: No discharge.     Pupils: Pupils are equal, round, and reactive to light.  Cardiovascular:     Rate and Rhythm: Normal rate.  Pulmonary:     Effort:  Pulmonary effort is normal. No respiratory distress.  Musculoskeletal:         General: Normal range of motion.     Cervical back: Normal range of motion.  Neurological:     Mental Status: She is alert and oriented to person, place, and time.  Psychiatric:        Mood and Affect: Mood is anxious and depressed.        Behavior: Behavior is cooperative.        Thought Content: Thought content is not paranoid. Thought content includes suicidal ideation. Thought content does not include homicidal ideation. Thought content does not include suicidal plan.   Review of Systems  Constitutional:  Negative for chills, diaphoresis, fever, malaise/fatigue and weight loss.  HENT:  Negative for congestion.   Respiratory:  Negative for cough and shortness of breath.   Cardiovascular:  Negative for chest pain and palpitations.  Gastrointestinal:  Negative for diarrhea, nausea and vomiting.  Neurological:  Negative for dizziness and seizures.  Psychiatric/Behavioral:  Positive for depression, hallucinations and suicidal ideas. Negative for memory loss and substance abuse. The patient is nervous/anxious.   All other systems reviewed and are negative.  Blood pressure 102/71, pulse 96, temperature 97.9 F (36.6 C), temperature source Oral, SpO2 99 %. There is no height or weight on file to calculate BMI.  Musculoskeletal: Strength & Muscle Tone: within normal limits Gait & Station: normal Patient leans: N/A   Recommendations:  Based on my evaluation the patient does not appear to have an emergency medical condition.  Disposition: No evidence of imminent risk to self or others at present.   Patient does not meet criteria for psychiatric inpatient admission. Supportive therapy provided about ongoing stressors. Discussed crisis plan, support from social network, calling 911, coming to the Emergency Department, and calling Suicide Hotline.  Patient encouraged to share AVH and other symptoms during PHP.   Jackelyn Poling, NP 09/22/2021, 2:02 AM

## 2021-09-23 ENCOUNTER — Encounter (HOSPITAL_COMMUNITY): Payer: Self-pay | Admitting: Emergency Medicine

## 2021-09-23 ENCOUNTER — Emergency Department (HOSPITAL_COMMUNITY): Payer: 59

## 2021-09-23 ENCOUNTER — Emergency Department (HOSPITAL_COMMUNITY)
Admission: EM | Admit: 2021-09-23 | Discharge: 2021-09-23 | Disposition: A | Discharge status: Home / Self Care | Payer: 59 | Attending: Emergency Medicine | Admitting: Emergency Medicine

## 2021-09-23 DIAGNOSIS — W2209XA Striking against other stationary object, initial encounter: Secondary | ICD-10-CM | POA: Diagnosis not present

## 2021-09-23 DIAGNOSIS — S60221A Contusion of right hand, initial encounter: Secondary | ICD-10-CM | POA: Diagnosis not present

## 2021-09-23 DIAGNOSIS — Y92002 Bathroom of unspecified non-institutional (private) residence single-family (private) house as the place of occurrence of the external cause: Secondary | ICD-10-CM | POA: Diagnosis not present

## 2021-09-23 DIAGNOSIS — S6991XA Unspecified injury of right wrist, hand and finger(s), initial encounter: Secondary | ICD-10-CM | POA: Diagnosis present

## 2021-09-23 HISTORY — DX: Schizoaffective disorder, unspecified: F25.9

## 2021-09-23 NOTE — ED Provider Notes (Signed)
[facility]-EMERGENCY DEPT Provider Note   CSN: [id number] Arrival date & time: 09/23/21  1523     History  Chief Complaint  Patient presents with   Hand Pain    Sabrina Bennett is a 21 y.o. female. With past medical history of schizoaffective disorder, PTSD, GAD, who presents to the emergency department with hand pain.  States that she punched a wall earlier today in the bathroom.  She states that she has schizoaffective disorder and has auditory hallucinations that at times tell her to hit things.  She states that this has been a chronic and ongoing problem.  She has had bruising now to the hand since hitting stall and some decreased range of motion due to the pain.  No SI/HI, currently with a caregiver    Hand Pain      Home Medications Prior to Admission medications   Medication Sig Start Date End Date Taking? Authorizing Provider  Cyanocobalamin (VITAMIN B-12) 50 MCG tablet Take 1 tablet (50 mcg total) by mouth daily. 09/19/21 10/19/21  Massengill, Harrold DonathNathan, MD  hydrOXYzine (ATARAX) 25 MG tablet Take 1 tablet (25 mg total) by mouth 3 (three) times daily as needed for anxiety. 09/18/21   Massengill, Harrold DonathNathan, MD  levothyroxine (SYNTHROID) 75 MCG tablet Take 1 tablet (75 mcg total) by mouth daily. 05/09/21   Massengill, Harrold DonathNathan, MD  metFORMIN (GLUCOPHAGE) 500 MG tablet Take 1 tablet (500 mg total) by mouth daily with breakfast. 09/19/21 10/19/21  Massengill, Harrold DonathNathan, MD  midodrine (PROAMATINE) 2.5 MG tablet Take 2.5 mg by mouth 2 (two) times daily. 04/18/21   [provider]  ramelteon (ROZEREM) 8 MG tablet Take 1 tablet (8 mg total) by mouth at bedtime. 09/18/21 10/18/21  Massengill, Harrold DonathNathan, MD  sertraline (ZOLOFT) 50 MG tablet Take 1 tablet (50 mg total) by mouth daily. 09/19/21 10/19/21  Massengill, Harrold DonathNathan, MD  SIMPESSE 0.15-0.03 &0.01 MG tablet Take 1 tablet by mouth at bedtime. 05/02/21   [provider]  topiramate (TOPAMAX) 25 MG tablet Take 1  tablet (25 mg total) by mouth 2 (two) times daily. 09/18/21 10/18/21  Massengill, Harrold DonathNathan, MD  ziprasidone (GEODON) 40 MG capsule Take 1 capsule (40 mg total) by mouth 2 (two) times daily with a meal. 09/18/21 10/18/21  Massengill, Harrold DonathNathan, MD      Allergies    Ferrous sulfate    Review of Systems   Review of Systems  Musculoskeletal:  Positive for arthralgias and joint swelling.  All other systems reviewed and are negative.  Physical Exam Updated Vital Signs BP 121/78 (BP Location: Left Arm)   Pulse 81   Temp 97.9 F (36.6 C) (Oral)   Resp 17   LMP 08/24/2021 (Approximate)   SpO2 96%  Physical Exam Vitals and nursing note reviewed.  Constitutional:      General: She is not in acute distress.    Appearance: Normal appearance. She is not ill-appearing or toxic-appearing.  HENT:     Head: Normocephalic and atraumatic.  Eyes:     General: No scleral icterus. Cardiovascular:     Pulses: Normal pulses.  Pulmonary:     Effort: Pulmonary effort is normal. No respiratory distress.  Musculoskeletal:        General: Swelling, tenderness and signs of injury present.     Comments: Right hand with bruising over the second metacarpal with mild swelling.  She does have some decreased range of motion of the right middle and right ring finger.  Radial pulse 2+.  Cap refill  less than 2 seconds.  Neurovascularly intact.  Compartments soft.  Skin:    General: Skin is warm and dry.     Capillary Refill: Capillary refill takes less than 2 seconds.     Findings: Bruising present. No rash.  Neurological:     General: No focal deficit present.     Mental Status: She is alert and oriented to person, place, and time. Mental status is at baseline.  Psychiatric:        Mood and Affect: Mood normal.        Behavior: Behavior normal.        Thought Content: Thought content normal.        Judgment: Judgment normal.    ED Results / Procedures / Treatments   Labs (all labs ordered are listed, but only  abnormal results are displayed) Labs Reviewed - No data to display  EKG None  Radiology DG Hand Complete Right  Result Date: 09/23/2021 CLINICAL DATA:  Hand pain at the third metacarpophalangeal joint. EXAM: RIGHT HAND - COMPLETE 3+ VIEW COMPARISON:  None Available. FINDINGS: There is no evidence of fracture or dislocation. There is no evidence of arthropathy or other focal bone abnormality. Soft tissues are unremarkable. IMPRESSION: Negative. Electronically Signed   By: Romona Curls M.D.   On: 09/23/2021 16:26    Procedures Procedures    Medications Ordered in ED Medications - No data to display  ED Course/ Medical Decision Making/ A&P                           Medical Decision Making Amount and/or Complexity of Data Reviewed Radiology: ordered.  This patient presents to the ED for concern of hand pain, this involves an extensive number of treatment options, and is a complaint that carries with it a high risk of complications and morbidity.  The differential diagnosis includes fracture, dislocation, ligamentous or tendonous injury   Co morbidities that complicate the patient evaluation Schizoaffective disorder  Additional history obtained:  Additional history obtained from: caregiver at bedside  External records from outside source obtained and reviewed including: none   Imaging Studies ordered:  I ordered imaging studies which included x-ray.  I independently reviewed & interpreted imaging & am in agreement with radiology impression. Imaging shows: XR Right Hand negative  Medications  -I reviewed the patient's home medications and did not make adjustments. -I did not prescribe new home medications.  ED Course: 21 year old female who presents to the emergency department with right hand pain after punching a wall.  Physical exam as noted above with some mild bruising over the second metacarpal.  She does have some decreased range of motion secondary to pain.   Neurovascularly intact.  X-ray of the right hand is negative for acute fractures or dislocations  Given instruction on RICE therapy.  She can use ibuprofen over the next few days as well as icing multiple times a day over the next week.  Given return precautions.  She verbalized understanding.  Caregiver verbalized understanding.  After consideration of the diagnostic results and the patients response to treatment, I feel that the patent would benefit from discharge. The patient has been appropriately medically screened and/or stabilized in the ED. I have low suspicion for any other emergent medical condition which would require further screening, evaluation or treatment in the ED or require inpatient management. The patient is overall well appearing and non-toxic in appearance. They are hemodynamically stable at time of  discharge.   Final Clinical Impression(s) / ED Diagnoses Final diagnoses:  Contusion of right hand, initial encounter    Rx / DC Orders ED Discharge Orders     None         Cristopher Peru, PA-C 09/23/21 1641    Charlynne Pander, MD 09/23/21 2242

## 2021-09-23 NOTE — ED Triage Notes (Signed)
Patient c/o pain in R hand. Reports history of schizoaffective disorder with auditory hallucinations. States she gets angry with "the voices" and punches objects. States this has been going on for a while and she has close psych follow-up. Bruising noted to R hand.

## 2021-09-23 NOTE — Discharge Instructions (Addendum)
You were seen in the emergency department today for hand pain.  You do not have any fractures or dislocations.  Please use ice multiple times a day for the next week to help reduce swelling.  You can also use ibuprofen every 8 hours during the day to alleviate pain and inflammation.  You may also want to elevate the arm while you are resting.  Please return to the emergency department for any emergent concerns

## 2021-09-24 ENCOUNTER — Encounter (HOSPITAL_COMMUNITY): Payer: Self-pay | Admitting: Family

## 2021-09-24 NOTE — Progress Notes (Signed)
Virtual Visit via Video Note  I connected with Sabrina Bennett on 09/24/21 at  9:00 AM EDT by a video enabled telemedicine application and verified that I am speaking with the correct person using two identifiers.  Location: Patient: Home Provider: Office   I discussed the limitations of evaluation and management by telemedicine and the availability of in person appointments. The patient expressed understanding and agreed to proceed.   I discussed the assessment and treatment plan with the patient. The patient was provided an opportunity to ask questions and all were answered. The patient agreed with the plan and demonstrated an understanding of the instructions.   The patient was advised to call back or seek an in-person evaluation if the symptoms worsen or if the condition fails to improve as anticipated.  I provided 15 minutes of non-face-to-face time during this encounter.   Sabrina Rack, NP    Behavioral Health Partial Program Assessment Note  Date: 09/24/2021 Name: Sabrina Bennett MRN: [id number]  Chief Complaint: Depression  Subjective: " I was trying to get readmitted back to the hospital but they refused to accept me again."   TIR:[id number] Sabrina Bennett is a 21 y.o. Caucasian female presents with depression and anxiety.  She reports intermittent passive suicidal ideations.  States she has been feeling really stressed and overwhelmed which is why she was admitted to [facility] behavioral health on last week.  States she had a medication adjustment but continues to report mood irritability.  States she is currently prescribed metformin, Topamax, Geodon and Zoloft.  She reports she has been taking medications as indicated.  Denies any medication side effects.  States her brother was recently diagnosed with depression and anxiety.  States she is unable to identify any recent or new stressors.  Does report auditory hallucinations.  Denies that they are command in nature.  Charted history  with schizoaffective disorder, major depressive disorder, and bipolar disorders.  Patient was enrolled in partial psychiatric program on 09/24/21.  Per discharge assessment; " Sabrina Bennett is a 21 y.o. female with a psychiatric history of bipolar disorder, depression, GAD, OCD, ADHD- inattentive type, ARFID, and binge-eating disorder, as well as a medical history of hypothyroidism and POTS who presents to [facility] as a walk-in due to AVH. Patient is accompanied by her boyfriend and mother. Patient's boyfriend participates in the assessment with her permission. Patient reports that 2 days ago she started hearing voices that tell her to hurt herself or they will kill her. She reports that she last experienced VH of shadows two days ago. When asked about current SI, patient states "little bit." She denies suicidal intent or plan."   Primary complaints include: depression worse and feeling depressed.  Onset of symptoms was gradual with gradually worsening course since that time. Psychosocial Stressors include the following: family and financial.   I have reviewed the following documentation dated 09/24/2021: past psychiatric history and past medical history  Complaints of Pain: nonear Past Psychiatric History:  Past psychiatric hospitalizations 1 weeks prior  Currently in treatment with Geodon, Zoloft, Rozerem, hydroxyzine and metformin.  Substance Abuse History: none Use of Alcohol: denied Use of Caffeine: denies use Use of over the counter:   No past surgical history on file.  Past Medical History:  Diagnosis Date   Schizoaffective disorder [facility])    Outpatient Encounter Medications as of 09/22/2021  Medication Sig   Cyanocobalamin (VITAMIN B-12) 50 MCG tablet Take 1 tablet (50 mcg total) by mouth daily.   hydrOXYzine (ATARAX) 25 MG tablet  Take 1 tablet (25 mg total) by mouth 3 (three) times daily as needed for anxiety.   levothyroxine (SYNTHROID) 75 MCG tablet Take 1 tablet (75 mcg total) by  mouth daily.   metFORMIN (GLUCOPHAGE) 500 MG tablet Take 1 tablet (500 mg total) by mouth daily with breakfast.   midodrine (PROAMATINE) 2.5 MG tablet Take 2.5 mg by mouth 2 (two) times daily.   ramelteon (ROZEREM) 8 MG tablet Take 1 tablet (8 mg total) by mouth at bedtime.   sertraline (ZOLOFT) 50 MG tablet Take 1 tablet (50 mg total) by mouth daily.   SIMPESSE 0.15-0.03 &0.01 MG tablet Take 1 tablet by mouth at bedtime.   topiramate (TOPAMAX) 25 MG tablet Take 1 tablet (25 mg total) by mouth 2 (two) times daily.   ziprasidone (GEODON) 40 MG capsule Take 1 capsule (40 mg total) by mouth 2 (two) times daily with a meal.   No facility-administered encounter medications on file as of 09/22/2021.   Allergies  Allergen Reactions   Ferrous Sulfate Nausea Only and Other (See Comments)    Headaches    Social History   Tobacco Use   Smoking status: Never    Passive exposure: Never   Smokeless tobacco: Never  Substance Use Topics   Alcohol use: Yes   Functioning Relationships: good support system Education: College       Please specify degree: [employer]  Other Pertinent History: None No family history on file.   Review of Systems Constitutional: negative  Objective:  Physical Exam:   Mental Status Exam: Appearance:  Well groomed Psychomotor::  Within Normal Limits Attention span and concentration: Normal Behavior: calm, cooperative, and adequate rapport can be established Speech:  normal pitch Mood:  depressed and anxious Affect:  normal Thought Process:  Coherent Thought Content:  Logical Orientation:  person, place, and time/date Cognition:  grossly intact Insight:  Intact Judgment:  Intact Estimate of Intelligence: Average Fund of knowledge: Aware of current events Memory: Recent and remote intact Abnormal movements: None Gait and station: Normal  Assessment:  Diagnosis: Difficulty coping [R45.89] 1. Difficulty coping   2. Schizoaffective disorder, bipolar type  [facility])     Indications for admission: inpatient care required if not in partial hospital program  Plan: Order placed for occupational therapy  patient enrolled in Partial Hospitalization Program, patient's current medications are to be continued, a comprehensive treatment plan will be developed, and side effects of medications have been reviewed with patient  Collaboration of Care: Medication Management AEB Zoloft and Geodon   Patient/Guardian was advised Release of Information must be obtained prior to any record release in order to collaborate their care with an outside provider. Patient/Guardian was advised if they have not already done so to contact the registration department to sign all necessary forms in order for [place] to release information regarding their care.   Consent: Patient/Guardian gives verbal consent for treatment and assignment of benefits for services provided during this visit. Patient/Guardian expressed understanding and agreed to proceed.   Treatment options and alternatives reviewed with patient and patient understands the above plan. Treatment plan was reviewed and agreed upon by NP T.Gearld Kerstein and patient Sabrina Cooler, NP

## 2021-09-25 ENCOUNTER — Other Ambulatory Visit (HOSPITAL_COMMUNITY): Payer: 59

## 2021-09-25 ENCOUNTER — Other Ambulatory Visit (HOSPITAL_COMMUNITY): Payer: 59 | Admitting: Licensed Clinical Social Worker

## 2021-09-25 ENCOUNTER — Other Ambulatory Visit (HOSPITAL_COMMUNITY)
Admission: EM | Admit: 2021-09-25 | Discharge: 2021-09-28 | Disposition: A | Discharge status: Home / Self Care | Payer: 59 | Attending: Psychiatry | Admitting: Psychiatry

## 2021-09-25 ENCOUNTER — Ambulatory Visit (HOSPITAL_COMMUNITY)
Admission: AD | Admit: 2021-09-25 | Discharge: 2021-09-25 | Disposition: A | Discharge status: Home / Self Care | Payer: 59 | Attending: Psychiatry | Admitting: Psychiatry

## 2021-09-25 ENCOUNTER — Encounter (HOSPITAL_COMMUNITY): Payer: Self-pay

## 2021-09-25 DIAGNOSIS — F250 Schizoaffective disorder, bipolar type: Secondary | ICD-10-CM

## 2021-09-25 DIAGNOSIS — R4589 Other symptoms and signs involving emotional state: Secondary | ICD-10-CM

## 2021-09-25 DIAGNOSIS — R4588 Nonsuicidal self-harm: Secondary | ICD-10-CM

## 2021-09-25 DIAGNOSIS — Z8659 Personal history of other mental and behavioral disorders: Secondary | ICD-10-CM

## 2021-09-25 DIAGNOSIS — F4310 Post-traumatic stress disorder, unspecified: Secondary | ICD-10-CM

## 2021-09-25 DIAGNOSIS — F411 Generalized anxiety disorder: Secondary | ICD-10-CM | POA: Diagnosis present

## 2021-09-25 DIAGNOSIS — F332 Major depressive disorder, recurrent severe without psychotic features: Secondary | ICD-10-CM | POA: Diagnosis present

## 2021-09-25 LAB — POCT PREGNANCY, URINE: Preg Test, Ur: NEGATIVE

## 2021-09-25 LAB — POCT URINE DRUG SCREEN - MANUAL ENTRY (I-SCREEN)
POC Amphetamine UR: NOT DETECTED
POC Buprenorphine (BUP): NOT DETECTED
POC Cocaine UR: NOT DETECTED
POC Marijuana UR: NOT DETECTED
POC Methadone UR: NOT DETECTED
POC Methamphetamine UR: NOT DETECTED
POC Morphine: NOT DETECTED
POC Oxazepam (BZO): NOT DETECTED
POC Oxycodone UR: NOT DETECTED
POC Secobarbital (BAR): NOT DETECTED

## 2021-09-25 LAB — RESP PANEL BY RT-PCR (FLU A&B, COVID) ARPGX2
Influenza A by PCR: NEGATIVE
Influenza B by PCR: NEGATIVE
SARS Coronavirus 2 by RT PCR: NEGATIVE

## 2021-09-25 MED ORDER — METFORMIN HCL 500 MG PO TABS
500.0000 mg | ORAL_TABLET | Freq: Every day | ORAL | Status: DC
  Administered 2021-09-26 – 2021-09-28 (×3): 500 mg via ORAL
  Filled 2021-09-25 (×3): qty 1

## 2021-09-25 MED ORDER — TOPIRAMATE 25 MG PO TABS
25.0000 mg | ORAL_TABLET | Freq: Two times a day (BID) | ORAL | Status: DC
  Administered 2021-09-25 – 2021-09-28 (×6): 25 mg via ORAL
  Filled 2021-09-25 (×6): qty 1

## 2021-09-25 MED ORDER — ZIPRASIDONE HCL 20 MG PO CAPS
40.0000 mg | ORAL_CAPSULE | Freq: Two times a day (BID) | ORAL | Status: DC
  Administered 2021-09-26: 40 mg via ORAL
  Filled 2021-09-25: qty 2

## 2021-09-25 MED ORDER — ALUM & MAG HYDROXIDE-SIMETH 200-200-20 MG/5ML PO SUSP: 30.0000 mL | ORAL | Status: DC | PRN

## 2021-09-25 MED ORDER — RAMELTEON 8 MG PO TABS
8.0000 mg | ORAL_TABLET | Freq: Every day | ORAL | Status: DC
  Administered 2021-09-25 – 2021-09-27 (×3): 8 mg via ORAL
  Filled 2021-09-25 (×4): qty 1

## 2021-09-25 MED ORDER — MAGNESIUM HYDROXIDE 400 MG/5ML PO SUSP
30.0000 mL | Freq: Every day | ORAL | Status: DC | PRN
  Administered 2021-09-27: 30 mL via ORAL
  Filled 2021-09-25: qty 30

## 2021-09-25 MED ORDER — LEVONORGEST-ETH ESTRAD 91-DAY 0.15-0.03 &0.01 MG PO TABS
1.0000 | ORAL_TABLET | Freq: Every day | ORAL | Status: DC
  Administered 2021-09-27: 1 via ORAL

## 2021-09-25 MED ORDER — SERTRALINE HCL 50 MG PO TABS
50.0000 mg | ORAL_TABLET | Freq: Every day | ORAL | Status: DC
  Administered 2021-09-25 – 2021-09-28 (×4): 50 mg via ORAL
  Filled 2021-09-25 (×4): qty 1

## 2021-09-25 MED ORDER — HYDROXYZINE HCL 25 MG PO TABS
25.0000 mg | ORAL_TABLET | Freq: Three times a day (TID) | ORAL | Status: DC | PRN
  Administered 2021-09-25: 25 mg via ORAL
  Filled 2021-09-25: qty 1

## 2021-09-25 MED ORDER — LEVOTHYROXINE SODIUM 75 MCG PO TABS
75.0000 ug | ORAL_TABLET | Freq: Every day | ORAL | Status: DC
  Administered 2021-09-26 – 2021-09-28 (×3): 75 ug via ORAL
  Filled 2021-09-25 (×3): qty 1

## 2021-09-25 MED ORDER — ACETAMINOPHEN 325 MG PO TABS
650.0000 mg | ORAL_TABLET | Freq: Four times a day (QID) | ORAL | Status: DC | PRN
  Administered 2021-09-27: 650 mg via ORAL
  Filled 2021-09-25: qty 2

## 2021-09-25 MED ORDER — MIDODRINE HCL 5 MG PO TABS
2.5000 mg | ORAL_TABLET | Freq: Two times a day (BID) | ORAL | Status: DC
  Administered 2021-09-26 – 2021-09-28 (×4): 2.5 mg via ORAL
  Filled 2021-09-25 (×3): qty 0.5
  Filled 2021-09-25: qty 1
  Filled 2021-09-25 (×3): qty 0.5

## 2021-09-25 MED ORDER — VITAMIN B-12 100 MCG PO TABS
50.0000 ug | ORAL_TABLET | Freq: Every day | ORAL | Status: DC
  Administered 2021-09-25 – 2021-09-28 (×4): 50 ug via ORAL
  Filled 2021-09-25 (×4): qty 1

## 2021-09-25 NOTE — ED Provider Notes (Incomplete)
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 acute phase of infection. The lowest concentration of SARS-CoV-2 viral copies this assay can detect is 138 copies/mL. A negative result does  not preclude SARS-Cov-2 infection and should not be used as the sole basis for treatment or other patient management decisions. A negative result may occur with  improper specimen collection/handling, submission of specimen other than nasopharyngeal swab, presence of viral mutation(s) within the areas targeted by this assay, and inadequate number of viral copies(<138 copies/mL). A negative result must be combined with clinical observations, patient history, and epidemiological information. The expected result is Negative.  Fact Sheet for Patients:  EntrepreneurPulse.com.au  Fact Sheet for Healthcare Providers:  IncredibleEmployment.be  This test is no                          t yet approved or cleared by the [place] FDA and  has been authorized for detection and/or diagnosis of SARS-CoV-2 by FDA under an Emergency Use Authorization (EUA). This EUA will remain  in effect (meaning this test can be used) for the duration of the COVID-19 declaration under Section 564(b)(1) of the Act, 21 U.S.C.section 360bbb-3(b)(1), unless the authorization is terminated  or revoked sooner.       Influenza A by PCR 08/24/2021 NEGATIVE  NEGATIVE Final   Influenza B by PCR 08/24/2021 NEGATIVE  NEGATIVE Final   Comment: (NOTE) The Xpert Xpress SARS-CoV-2/FLU/RSV plus assay is intended  as an aid in the diagnosis of influenza from Nasopharyngeal swab specimens and should not be used as a sole basis for treatment. Nasal washings and aspirates are unacceptable for Xpert Xpress SARS-CoV-2/FLU/RSV testing.  Fact Sheet for Patients: EntrepreneurPulse.com.au  Fact Sheet for Healthcare Providers: IncredibleEmployment.be  This test is not yet approved or cleared by the [place] FDA and has been authorized for detection and/or diagnosis of SARS-CoV-2 by FDA under an Emergency Use Authorization (EUA). This EUA will remain in  effect (meaning this test can be used) for the duration of the COVID-19 declaration under Section 564(b)(1) of the Act, 21 U.S.C. section 360bbb-3(b)(1), unless the authorization is terminated or revoked.  Performed at [facility], [place] [address] [place], [place] [address]   Admission on 08/01/2021, Discharged on 08/01/2021  Component Date Value Ref Range Status   SARS Coronavirus 2 by RT PCR 08/01/2021 NEGATIVE  NEGATIVE Final   Comment: (NOTE) SARS-CoV-2 target nucleic acids are NOT DETECTED.  The SARS-CoV-2 RNA is generally detectable in upper respiratory specimens during the acute phase of infection. The lowest concentration of SARS-CoV-2 viral copies this assay can detect is 138 copies/mL. A negative result does not preclude SARS-Cov-2 infection and should not be used as the sole basis for treatment or other patient management decisions. A negative result may occur with  improper specimen collection/handling, submission of specimen other than nasopharyngeal swab, presence of viral mutation(s) within the areas targeted by this assay, and inadequate number of viral copies(<138 copies/mL). A negative result must be combined with clinical observations, patient history, and epidemiological information. The expected result is Negative.  Fact Sheet for Patients:  EntrepreneurPulse.com.au  Fact Sheet for Healthcare Providers:  IncredibleEmployment.be  This test is no                          t yet approved or cleared by the [place] FDA and  has been authorized for detection and/or diagnosis of SARS-CoV-2 by FDA under an Emergency Use Authorization (EUA). This EUA will remain  in effect (meaning this test can be used) for the duration of the COVID-19 declaration under Section 564(b)(1) of the Act, 21 U.S.C.section 360bbb-3(b)(1), unless the authorization is terminated  or revoked sooner.       Influenza A by PCR  08/01/2021 NEGATIVE  NEGATIVE Final   Influenza B by PCR 08/01/2021 NEGATIVE  NEGATIVE Final   Comment: (NOTE) The Xpert Xpress SARS-CoV-2/FLU/RSV plus assay is intended as an aid in the diagnosis of influenza from Nasopharyngeal swab specimens and should not be used as a sole basis for treatment. Nasal washings and aspirates are unacceptable for Xpert Xpress SARS-CoV-2/FLU/RSV testing.  Fact Sheet for Patients: EntrepreneurPulse.com.au  Fact Sheet for Healthcare Providers: IncredibleEmployment.be  This test is not yet approved or cleared by the [place] FDA and has been authorized for detection and/or diagnosis of SARS-CoV-2 by FDA under an Emergency Use Authorization (EUA). This EUA will remain in effect (meaning this test can be used) for the duration of the COVID-19 declaration under Section 564(b)(1) of the Act, 21 U.S.C. section 360bbb-3(b)(1), unless the authorization is terminated or revoked.  Performed at [facility], [place] [address], [place], [place] [address]   Admission on 05/03/2021, Discharged on 05/08/2021  Component Date Value Ref Range Status   SARS Coronavirus 2 by RT PCR 05/03/2021 NEGATIVE  NEGATIVE Final   Comment: (NOTE) SARS-CoV-2 target nucleic acids are NOT DETECTED.  The SARS-CoV-2  RNA is generally detectable in upper respiratory specimens during the acute phase of infection. The lowest concentration of SARS-CoV-2 viral copies this assay can detect is 138 copies/mL. A negative result does not preclude SARS-Cov-2 infection and should not be used as the sole basis for treatment or other patient management decisions. A negative result may occur with  improper specimen collection/handling, submission of specimen other than nasopharyngeal swab, presence of viral mutation(s) within the areas targeted by this assay, and inadequate number of viral copies(<138 copies/mL). A negative result must be  combined with clinical observations, patient history, and epidemiological information. The expected result is Negative.  Fact Sheet for Patients:  BloggerCourse.com  Fact Sheet for Healthcare Providers:  SeriousBroker.it  This test is no                          t yet approved or cleared by the [place] FDA and  has been authorized for detection and/or diagnosis of SARS-CoV-2 by FDA under an Emergency Use Authorization (EUA). This EUA will remain  in effect (meaning this test can be used) for the duration of the COVID-19 declaration under Section 564(b)(1) of the Act, 21 U.S.C.section 360bbb-3(b)(1), unless the authorization is terminated  or revoked sooner.       Influenza A by PCR 05/03/2021 NEGATIVE  NEGATIVE Final   Influenza B by PCR 05/03/2021 NEGATIVE  NEGATIVE Final   Comment: (NOTE) The Xpert Xpress SARS-CoV-2/FLU/RSV plus assay is intended as an aid in the diagnosis of influenza from Nasopharyngeal swab specimens and should not be used as a sole basis for treatment. Nasal washings and aspirates are unacceptable for Xpert Xpress SARS-CoV-2/FLU/RSV testing.  Fact Sheet for Patients: BloggerCourse.com  Fact Sheet for Healthcare Providers: SeriousBroker.it  This test is not yet approved or cleared by the [place] FDA and has been authorized for detection and/or diagnosis of SARS-CoV-2 by FDA under an Emergency Use Authorization (EUA). This EUA will remain in effect (meaning this test can be used) for the duration of the COVID-19 declaration under Section 564(b)(1) of the Act, 21 U.S.C. section 360bbb-3(b)(1), unless the authorization is terminated or revoked.  Performed at [facility], [address] [place], [place] [address]    WBC 05/03/2021 8.8  4.0 - 10.5 K/uL Final   RBC 05/03/2021 4.27  3.87 - 5.11 MIL/uL Final   Hemoglobin  05/03/2021 13.2  12.0 - 15.0 g/dL Final   HCT 97/67/3419 40.8  36.0 - 46.0 % Final   MCV 05/03/2021 95.6  80.0 - 100.0 fL Final   MCH 05/03/2021 30.9  26.0 - 34.0 pg Final   MCHC 05/03/2021 32.4  30.0 - 36.0 g/dL Final   RDW 37/90/2409 12.3  11.5 - 15.5 % Final   Platelets 05/03/2021 207  150 - 400 K/uL Final   nRBC 05/03/2021 0.0  0.0 - 0.2 % Final   Performed at [facility], [address], [place], [place] [address]   Sodium 05/03/2021 138  135 - 145 mmol/L Final   Potassium 05/03/2021 4.1  3.5 - 5.1 mmol/L Final   Chloride 05/03/2021 106  98 - 111 mmol/L Final   CO2 05/03/2021 26  22 - 32 mmol/L Final   Glucose, Bld 05/03/2021 114 (H)  70 - 99 mg/dL Final   Glucose reference range applies only to samples taken after fasting for at least 8 hours.   BUN 05/03/2021 14  6 - 20 mg/dL Final  Creatinine, Ser 05/03/2021 1.02 (H)  0.44 - 1.00 mg/dL Final   Calcium 05/03/2021 9.2  8.9 - 10.3 mg/dL Final   Total Protein 05/03/2021 6.8  6.5 - 8.1 g/dL Final   Albumin 05/03/2021 3.9  3.5 - 5.0 g/dL Final   AST 05/03/2021 18  15 - 41 U/L Final   ALT 05/03/2021 11  0 - 44 U/L Final   Alkaline Phosphatase 05/03/2021 55  38 - 126 U/L Final   Total Bilirubin 05/03/2021 0.3  0.3 - 1.2 mg/dL Final   GFR, Estimated 05/03/2021 >60  >60 mL/min Final   Comment: (NOTE) Calculated using the CKD-EPI Creatinine Equation (2021)    Anion gap 05/03/2021 6  5 - 15 Final   Performed at [facility], [place] [address], [place], [place] [address]   Hgb A1c MFr Bld 05/03/2021 5.0  4.8 - 5.6 % Final   Comment: (NOTE) Pre diabetes:          5.7%-6.4%  Diabetes:              >6.4%  Glycemic control for   <7.0% adults with diabetes    Mean Plasma Glucose 05/03/2021 96.8  mg/dL Final   Performed at [facility], [place] [address], [place], [place] [address]   Cholesterol 05/03/2021 140  0 - 200 mg/dL Final   Triglycerides 05/03/2021 47  <150 mg/dL Final   HDL 05/03/2021  47  >40 mg/dL Final   Total CHOL/HDL Ratio 05/03/2021 3.0  RATIO Final   VLDL 05/03/2021 9  0 - 40 mg/dL Final   LDL Cholesterol 05/03/2021 84  0 - 99 mg/dL Final   Comment:        Total Cholesterol/HDL:CHD Risk Coronary Heart Disease Risk Table                     Men   Women  1/2 Average Risk   3.4   3.3  Average Risk       5.0   4.4  2 X Average Risk   9.6   7.1  3 X Average Risk  23.4   11.0        Use the calculated Patient Ratio above and the CHD Risk Table to determine the patient's CHD Risk.        ATP III CLASSIFICATION (LDL):  <100     mg/dL   Optimal  100-129  mg/dL   Near or Above                    Optimal  130-159  mg/dL   Borderline  160-189  mg/dL   High  >190     mg/dL   Very High Performed at [place] [address] [place], [place] [address]    TSH 05/03/2021 6.815 (H)  0.350 - 4.500 uIU/mL Final   Comment: Performed by a 3rd Generation assay with a functional sensitivity of <=0.01 uIU/mL. Performed at [facility], [place] [address], [place], [place] [address]    Preg Test, Ur 05/03/2021 NEGATIVE  NEGATIVE Final   Comment:        THE SENSITIVITY OF THIS METHODOLOGY IS >20 mIU/mL. Performed at [facility], [place] [address] [place], [place] [address]    Opiates 05/03/2021 NONE DETECTED  NONE DETECTED Final   Cocaine 05/03/2021 NONE DETECTED  NONE DETECTED Final   Benzodiazepines 05/03/2021 NONE DETECTED  NONE DETECTED Final   Amphetamines  05/03/2021 NONE DETECTED  NONE DETECTED Final   Tetrahydrocannabinol 05/03/2021 NONE DETECTED  NONE DETECTED Final   Barbiturates 05/03/2021 NONE DETECTED  NONE DETECTED Final   Comment: (NOTE) DRUG SCREEN FOR MEDICAL PURPOSES ONLY.  IF CONFIRMATION IS NEEDED FOR ANY PURPOSE, NOTIFY LAB WITHIN 5 DAYS.  LOWEST DETECTABLE LIMITS FOR URINE DRUG SCREEN Drug Class                     Cutoff (ng/mL) Amphetamine and metabolites    1000 Barbiturate and  metabolites    200 Benzodiazepine                 [id number] Tricyclics and metabolites     300 Opiates and metabolites        300 Cocaine and metabolites        300 THC                            50 Performed at [facility], [place] [address] [place], [place] [address]    Lithium Lvl 05/03/2021 0.17 (L)  0.60 - 1.20 mmol/L Final   Performed at [place] [address] [place], [place] [address]   Lithium Lvl 05/07/2021 0.46 (L)  0.60 - 1.20 mmol/L Final   Performed at [place] [address] [place], [place] [address]   Sodium 05/07/2021 138  135 - 145 mmol/L Final   Potassium 05/07/2021 3.9  3.5 - 5.1 mmol/L Final   Chloride 05/07/2021 107  98 - 111 mmol/L Final   CO2 05/07/2021 25  22 - 32 mmol/L Final   Glucose, Bld 05/07/2021 100 (H)  70 - 99 mg/dL Final   Glucose reference range applies only to samples taken after fasting for at least 8 hours.   BUN 05/07/2021 13  6 - 20 mg/dL Final   Creatinine, Ser 05/07/2021 1.07 (H)  0.44 - 1.00 mg/dL Final   Calcium 05/07/2021 9.2  8.9 - 10.3 mg/dL Final   GFR, Estimated 05/07/2021 >60  >60 mL/min Final   Comment: (NOTE) Calculated using the CKD-EPI Creatinine Equation (2021)    Anion gap 05/07/2021 6  5 - 15 Final   Performed at [facility], [place] [address], [place], [place] [address]   TSH 05/07/2021 7.483 (H)  0.350 - 4.500 uIU/mL Final   Comment: Performed by a 3rd Generation assay with a functional sensitivity of <=0.01 uIU/mL. Performed at [facility], [place] [address] [place], [place] [address]     Allergies: Ferrous sulfate  PTA Medications:  Prior to Admission medications   Medication Sig Start Date End Date Taking? Authorizing Provider  Cyanocobalamin (VITAMIN B-12) 50 MCG tablet Take 1 tablet (50 mcg total) by mouth daily. 09/19/21 10/19/21  Massengill, Ovid Curd, MD  haloperidol (HALDOL) 5 MG tablet Take 5 mg by mouth 2 (two)  times daily. 09/18/21   [provider]  hydrOXYzine (ATARAX) 25 MG tablet Take 1 tablet (25 mg total) by mouth 3 (three) times daily as needed for anxiety. 09/18/21   Massengill, Ovid Curd, MD  levothyroxine (SYNTHROID) 75 MCG tablet Take 1 tablet (75 mcg total) by mouth daily. 05/09/21   Massengill, Ovid Curd, MD  metFORMIN (GLUCOPHAGE) 500 MG tablet Take 1 tablet (500 mg total) by mouth daily with breakfast. 09/19/21 10/19/21  Massengill, Ovid Curd, MD  midodrine (PROAMATINE) 2.5 MG tablet Take 2.5 mg by mouth 2 (two) times daily. 04/18/21  [provider]  ramelteon (ROZEREM) 8 MG tablet Take 1 tablet (8 mg total) by mouth at bedtime. 09/18/21 10/18/21  Massengill, Ovid Curd, MD  sertraline (ZOLOFT) 50 MG tablet Take 1 tablet (50 mg total) by mouth daily. 09/19/21 10/19/21  Massengill, Ovid Curd, MD  SIMPESSE 0.15-0.03 &0.01 MG tablet Take 1 tablet by mouth at bedtime. 05/02/21   [provider]  topiramate (TOPAMAX) 25 MG tablet Take 1 tablet (25 mg total) by mouth 2 (two) times daily. 09/18/21 10/18/21  Massengill, Ovid Curd, MD  ziprasidone (GEODON) 40 MG capsule Take 1 capsule (40 mg total) by mouth 2 (two) times daily with a meal. 09/18/21 10/18/21  Massengill, Ovid Curd, MD      Long Term Goals: Improvement in symptoms so as ready for discharge  Short Term Goals: Ability to identify changes in lifestyle to reduce recurrence of condition will improve, Ability to verbalize feelings will improve, Ability to demonstrate self-control will improve, Ability to identify and develop effective coping behaviors will improve, and Ability to identify triggers associated with substance abuse/mental health issues will improve  Medical Decision Making  Admit to [facility] for stabilization  Patient has been participating in [facility], but feels it is not helpful.   Patient reports that she is "95%" sure that she received a tetanus boost within the last 5 years. Reviewed benefits/risks of tetanus vaccine related to  recent cutting episode. Declines tetanus vaccine at this time.   Reviewed PTA medications with patient, will continue as noted below. 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 -Sertraline 50 mg daily for depression -Ramelteon 8 mg daily for insomnia -Ziprasidone 40 mg twice daily with meals for AVH/mood stability -Cyanocobalamin 50 mcg daily for nutritional supplementation -Hydroxyzine 25 mg 3 times daily as needed for anxiety -SIMPESSE 0.15-0.03 &0.01 MG tablet QHS for birth control  Lab Orders         CBC with Differential/Platelet         Comprehensive metabolic panel         Pregnancy, urine         POCT Urine Drug Screen - (I-Screen)         Pregnancy, urine POC          Recommendations  Based on my evaluation the patient does not appear to have an emergency medical condition.  Rozetta Nunnery, NP 09/26/21  12:08 AM

## 2021-09-25 NOTE — Therapy (Signed)
[facility] PARTIAL HOSPITALIZATION PROGRAM [address] [place], [place], [address] Phone: [phone]   Fax:  [phone]  Occupational Therapy Treatment  Virtual Visit via Video Note  I connected with Sabrina Bennett on 09/25/21 at  8:00 AM EDT by a video enabled telemedicine application and verified that I am speaking with the correct person using two identifiers.  Location: Patient: home Provider: office   I discussed the limitations of evaluation and management by telemedicine and the availability of in person appointments. The patient expressed understanding and agreed to proceed.    The patient was advised to call back or seek an in-person evaluation if the symptoms worsen or if the condition fails to improve as anticipated.  I provided 60 minutes of non-face-to-face time during this encounter.   Patient Details  Name: Sabrina Bennett MRN: [id number] Date of Birth: April 28, 2001 No data recorded  Encounter Date: 09/25/2021   OT End of Session - 09/25/21 1610     Visit Number 3    Number of Visits 20    Date for OT Re-Evaluation 10/21/21    Authorization Type [employer]    Authorization Time Period working on OT verification benefits currently    OT Start Time 1100    OT Stop Time 1200    OT Time Calculation (min) 60 min    Activity Tolerance Patient tolerated treatment well    Behavior During Therapy WFL for tasks assessed/performed             Past Medical History:  Diagnosis Date   Schizoaffective disorder (HCC)     History reviewed. No pertinent surgical history.  There were no vitals filed for this visit.   Subjective Assessment - 09/25/21 1610     Currently in Pain? No/denies    Pain Score 0-No pain    Multiple Pain Sites No               Group Session:  S: "Feeling better. Still having a hard time overall, unfortunately"  O:  In this group therapy session, the objective was to explore the  multifaceted concept of purpose using insights from occupational therapy, anthropology, psychology, and life coaching. The participants engaged in a deep exploration of the innate human impulse for purposeful engagement and the impact of purpose on mental and emotional well-being.   The discussion centered around the challenges of modern life, including feelings of isolation despite technological advancements, the connection between purposelessness and depression/anxiety, and the importance of occupational roles in achieving fulfillment. Strategies were shared to identify areas of life where purpose is lacking and to develop immediate and actionable plans to overcome these challenges. The participants gained valuable insights into the intersection of purpose and mental health, as well as practical tools to navigate their personal journeys towards purpose-driven lives.   Overall, the session fostered a sense of empowerment and inspired the participants to take proactive steps towards aligning their lives with their true purpose.  A: During the group therapy session, Patient exhibited a lower level of active participation. They appeared more reserved and less engaged in the discussions surrounding purpose. Patient listened attentively to others' contributions but did not actively share personal insights or experiences. Their level of interaction suggested a potential challenge in connecting with the concept of purpose or expressing their thoughts and feelings openly. Patient's limited participation could be indicative of potential barriers or internal conflicts hindering their engagement. Further exploration is needed to understand the underlying factors contributing  to their reduced level of participation and to facilitate their active involvement in future sessions.   P: Continue to attend PHP OT group sessions 5x week for 4 weeks to promote daily structure, social engagement, and opportunities to develop  and utilize adaptive strategies to maximize functional performance in preparation for safe transition and integration back into school, work, and the community. Plan to address topic of system and goals in next OT group session.                    OT Education - 09/25/21 1610     Education Details Unveiling the Path to Personal Fulfillment / Discovering Purpose 2/2    Person(s) Educated Patient    Methods Explanation;Handout    Comprehension Verbalized understanding              OT Short Term Goals - 09/21/21 2308       OT SHORT TERM GOAL #1   Title Pt will be independent with learned psychosocial skills in order to successfully engage in community re-entry at time of discharge    Time 4    Period Weeks    Status New    Target Date 10/21/21      OT SHORT TERM GOAL #2   Title pt will identify at least three new coping skills for stress mgmt for successful community re-entry at time of discharge                      Plan - 09/25/21 1611     Psychosocial Skills Coping Strategies;Habits;Interpersonal Interaction;Routines and Behaviors             Patient will benefit from skilled therapeutic intervention in order to improve the following deficits and impairments:       Psychosocial Skills: Coping Strategies, Habits, Interpersonal Interaction, Routines and Behaviors   Visit Diagnosis: Difficulty coping    Problem List Patient Active Problem List   Diagnosis Date Noted   Schizoaffective disorder, bipolar type (HCC) 09/11/2021   History of OCD (obsessive compulsive disorder) 09/11/2021   MDD (major depressive disorder), recurrent episode, severe (HCC) 09/10/2021   Panic disorder 05/04/2021   Bipolar 1 disorder, depressed, severe (HCC) 05/03/2021   PTSD (post-traumatic stress disorder) 05/03/2021   OCD (obsessive compulsive disorder) 05/03/2021   Generalized anxiety disorder 12/02/2020   Major depressive disorder 12/02/2020   POTS  (postural orthostatic tachycardia syndrome) 12/02/2020   Vitamin D deficiency 12/02/2020   Subclinical hypothyroidism 05/17/2017    Ted Mcalpine, OT 09/25/2021, 4:11 PM  Kerrin Champagne, OT   [facility] HOSPITALIZATION PROGRAM [address] [place], [place], [address] Phone: [phone]   Fax:  [phone]  Name: Sabrina Bennett MRN: [id number] Date of Birth: 07/18/2000

## 2021-09-25 NOTE — ED Notes (Signed)
Patient is calm and cooperative. She states that something triggered her but she doesn't know what. "It is just an accumulation of a lot of things". "I was just diagnosed with schizoaffective disorder and the medication is not helping. "I hear voices telling me to hurt myself and I see shadowy figures". Patient took evening scheduled meds and requested a vistaril to help her sleep. Will continue to monitor for safety.

## 2021-09-25 NOTE — H&P (Signed)
Behavioral Health Medical Screening Exam  Sabrina Bennett is a 21 y.o. female.  Total Time spent with patient: {Time; 15 min - 8 hours:17441}  Psychiatric Specialty Exam:  Presentation  General Appearance: Appropriate for Environment; Neat  Eye Contact:Good  Speech:Clear and Coherent; Normal Rate  Speech Volume:Normal  Handedness:Right   Mood and Affect  Mood:Depressed  Affect:Congruent   Thought Process  Thought Processes:Coherent; Linear  Descriptions of Associations:Intact  Orientation:Full (Time, Place and Person)  Thought Content:Logical  History of Schizophrenia/Schizoaffective disorder:No  Duration of Psychotic Symptoms:Greater than six months  Hallucinations:Hallucinations: Auditory  Ideas of Reference:None  Suicidal Thoughts:Suicidal Thoughts: Yes, Active SI Active Intent and/or Plan: Without Intent; Without Plan  Homicidal Thoughts:Homicidal Thoughts: No   Sensorium  Memory:Immediate Good; Recent Good; Remote Good  Judgment:Intact  Insight:Shallow   Executive Functions  Concentration:Good  Attention Span:Good  Recall:Good  Fund of Knowledge:Good  Language:Good   Psychomotor Activity  Psychomotor Activity:Psychomotor Activity: Normal   Assets  Assets:Communication Skills; Desire for Improvement; Financial Resources/Insurance; Physical Health; Housing; Social Support   Sleep  Sleep:Sleep: Poor    Physical Exam: Physical Exam ROS There were no vitals taken for this visit. There is no height or weight on file to calculate BMI.  Musculoskeletal: Strength & Muscle Tone: {desc; muscle tone:32375} Gait & Station: {PE GAIT ED TDVV:61607} Patient leans: {Patient Leans:21022755}   Recommendations:  {[facility] MSE Recommendations:304701}  Jackelyn Poling, NP 09/25/2021, 9:36 PM

## 2021-09-25 NOTE — BH Assessment (Signed)
Comprehensive Clinical Assessment (CCA) Note  09/25/2021 Sabrina Bennett [id number]  Disposition: Nira ConnJason Berry, NP, [facility]. Patient accepted by Sindy Guadeloupeoy Williams, NP.   The patient demonstrates the following risk factors for suicide: Chronic risk factors for suicide include: psychiatric disorder of depression and previous self-harm cutting on arms yesterday . Acute risk factors for suicide include: social withdrawal/isolation and recent discharge from inpatient psychiatry. Protective factors for this patient include: positive social support, responsibility to others (children, family), coping skills, and hope for the future. Considering these factors, the overall suicide risk at this point appears to be moderate. Patient is not appropriate for outpatient follow up.  Flowsheet Row Counselor from 09/20/2021 in BEHAVIORAL HEALTH PARTIAL HOSPITALIZATION PROGRAM Admission (Discharged) from OP [facility]  C-SSRS RISK CATEGORY Moderate Risk High Risk High Risk       Sabrina BolognaSabrina Bennett is a 21 year old female [facility] due to SI with no plan and auditory hallucinations. Patient was accompanied by mother, Jackelyn Hoehnlizabeth Natter. Patient gave consent for mother to be present during assessment. Patient reported worsening SI with no plan, voices are getting louder, medications are not working and self-harm of cutting arms yesterday. Patient reported intentions regarding cutting her arms was to stop the hearing the loud voices. Patient unable to identify triggers of stressors. Patient reported voices are telling her to punch a wall, cut herself and if doesn't do it they are going to kill me. Patient reported worsening depressive symptoms. Patient reported poor sleep due to voices and poor appetite. Patient has psychiatric history of bipolar disorder,  depression, GAD, OCD, ADHD- inattentive type, ARFID, and binge-eating disorder, as well as a medical history of hypothyroidism and POTS  [facility] and discharged to follow up with medication management and PHP. [facility] to 09/18/2021. Patient states that she has been taking medications as prescribed since discharge. [facility] and has plans to attend session on 09/22/2021.  [place] in [place]. Patient is currently unemployed. 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 Patient was anxious and cooperative during assessment. Patient unable to contract.   Chief Complaint:  Chief Complaint  Patient presents with   Suicidal   Visit Diagnosis:  Major depressive disorder    CCA Screening, Triage and Referral (STR)  Patient Reported Information How did you hear about us? Family/Friend  What Is the Reason for Your Visit/Call Today? SI and hallucinations  How Long Has This Been Causing You Problems? 1 wk - 1 month  What Do You Feel Would Help You the Most Today? Treatment for Depression or other mood problem   Have You Recently Had Any Thoughts About Hurting Yourself? Yes  Are You Planning to Commit Suicide/Harm Yourself At This time? No   Have you Recently Had Thoughts About Hurting Someone Karolee Ohslse? No  Are You Planning to Harm Someone at This Time? No  Explanation: No data recorded  Have You Used Any Alcohol or Drugs in the Past 24 Hours? No  How Long Ago Did You Use Drugs or Alcohol? No data recorded What Did You Use and How Much? No data recorded  Do You Currently Have a Therapist/Psychiatrist? Yes  Name of Therapist/Psychiatrist: [facility]   Have You Been Recently Discharged From Any Office Practice  or Programs? No  Explanation of Discharge From Practice/Program: No data recorded    CCA Screening Triage Referral  Assessment Type of Contact: Face-to-Face  Telemedicine Service Delivery:   Is this Initial or Reassessment? Initial Assessment  Date Telepsych consult ordered in CHL:  No data recorded Time Telepsych consult ordered in CHL:  No data recorded Location of Assessment: [facility]  Provider Location: [facility]   Collateral Involvement: chart review   Does Patient Have a Court Appointed Legal Guardian? No data recorded Name and Contact of Legal Guardian: No data recorded If Minor and Not Living with Parent(s), Who has Custody? NA  Is CPS involved or ever been involved? Never  Is APS involved or ever been involved? Never   Patient Determined To Be At Risk for Harm To Self or Others Based on Review of Patient Reported Information or Presenting Complaint? No  Method: No data recorded Availability of Means: No data recorded Intent: No data recorded Notification Required: No data recorded Additional Information for Danger to Others Potential: No data recorded Additional Comments for Danger to Others Potential: No data recorded Are There Guns or Other Weapons in Your Home? No data recorded Types of Guns/Weapons: No data recorded Are These Weapons Safely Secured?                            No data recorded Who Could Verify You Are Able To Have These Secured: No data recorded Do You Have any Outstanding Charges, Pending Court Dates, Parole/Probation? No data recorded Contacted To Inform of Risk of Harm To Self or Others: No data recorded   Does Patient Present under Involuntary Commitment? No  IVC Papers Initial File Date: No data recorded  [place] of Residence: [place]   Patient Currently Receiving the Following Services: Not Receiving Services   Determination of Need: Urgent (48 hours)   Options For Referral: Facility-Based Crisis; Medication Management; Outpatient Therapy; Partial Hospitalization; Inpatient Hospitalization     CCA  Biopsychosocial Patient Reported Schizophrenia/Schizoaffective Diagnosis in Past: Yes   Strengths: Pt is willing to participate in treatment   Mental Health Symptoms Depression:   Change in energy/activity; Fatigue; Hopelessness; Tearfulness; Weight gain/loss; Irritability   Duration of Depressive symptoms:    Mania:   Change in energy/activity; Racing thoughts; Recklessness   Anxiety:    Difficulty concentrating; Restlessness; Irritability   Psychosis:   Hallucinations   Duration of Psychotic symptoms:  Duration of Psychotic Symptoms: Greater than six months   Trauma:   None   Obsessions:   None   Compulsions:   None   Inattention:   None   Hyperactivity/Impulsivity:   N/A   Oppositional/Defiant Behaviors:   None   Emotional Irregularity:   Chronic feelings of emptiness; Mood lability; Intense/unstable relationships   Other Mood/Personality Symptoms:   NA    Mental Status Exam Appearance and self-care  Stature:   Average   Weight:   Average weight   Clothing:   Neat/clean; Casual   Grooming:   Normal   Cosmetic use:   None   Posture/gait:   Normal   Motor activity:   Not Remarkable   Sensorium  Attention:   Normal   Concentration:   Anxiety interferes   Orientation:   X5   Recall/memory:   Normal   Affect and Mood  Affect:   Anxious; Depressed   Mood:   Anxious; Depressed   Relating  Eye contact:  Normal   Facial expression:   Anxious; Depressed   Attitude toward examiner:   Cooperative   Thought and Language  Speech flow:  Clear and Coherent   Thought content:   Appropriate to Mood and Circumstances   Preoccupation:   None   Hallucinations:   Auditory; Visual; Command (Comment) (Pt reports voices tell her to hurt herself)   Organization:  No data recorded  [employer] of Knowledge:   Average   Intelligence:   Average   Abstraction:   Normal   Judgement:   Fair   Education administrator:   Realistic   Insight:   Fair   Decision Making:   Normal; Impulsive; Vacilates   Social Functioning  Social Maturity:   Responsible   Social Judgement:   Normal   Stress  Stressors:   Family conflict; Work; Programmer, multimedia; Illness   Coping Ability:   Human resources officer Deficits:   Scientist, physiological; Self-care   Supports:   Family; Friends/Service system     Religion: Religion/Spirituality Are You A Religious Person?: No (Pt states she is spiritual)  Leisure/Recreation: Leisure / Recreation Do You Have Hobbies?: Yes Leisure and Hobbies: read; color; walk my dog; shopping; listening to music  Exercise/Diet: Exercise/Diet Do You Exercise?: No Have You Gained or Lost A Significant Amount of Weight in the Past Six Months?: Yes-Gained Number of Pounds Gained: 30 Do You Follow a Special Diet?: No Do You Have Any Trouble Sleeping?: No   CCA Employment/Education Employment/Work Situation: Employment / Work Situation Employment Situation: Employed Work Stressors: Pt states they often feels stressed at work Patient's Job has Been Impacted by Current Illness: No Has Patient ever Been in Equities trader?: No  Education: Education Last Grade Completed: 12 Did You Product manager?: Yes What Type of College Degree Do you Have?: [employer] Did You Have An Individualized Education Program (IIEP): No Did You Have Any Difficulty At [employer]?: No   CCA Family/Childhood History Family and Relationship History: Family history Long term relationship, how long?: 3 years What types of issues is patient dealing with in the relationship?: Pt states partner is supportive Additional relationship information: NA Does patient have children?: No  Childhood History:  Childhood History By whom was/is the patient raised?: Both parents Description of patient's current relationship with siblings: 2 brothers: good with both Did patient suffer any verbal/emotional/physical/sexual  abuse as a child?: No Has patient ever been sexually abused/assaulted/raped as an adolescent or adult?: No Witnessed domestic violence?: No Has patient been affected by domestic violence as an adult?: No  Child/Adolescent Assessment:     CCA Substance Use Alcohol/Drug Use: Alcohol / Drug Use Pain Medications: See MAR Prescriptions: See MAR Over the Counter: See MAR History of alcohol / drug use?: No history of alcohol / drug abuse                         ASAM's:  Six Dimensions of Multidimensional Assessment  Dimension 1:  Acute Intoxication and/or Withdrawal Potential:      Dimension 2:  Biomedical Conditions and Complications:      Dimension 3:  Emotional, Behavioral, or Cognitive Conditions and Complications:     Dimension 4:  Readiness to Change:     Dimension 5:  Relapse, Continued use, or Continued Problem Potential:     Dimension 6:  Recovery/Living Environment:     ASAM Severity Score:    ASAM Recommended Level of Treatment:  Substance use Disorder (SUD)    Recommendations for Services/Supports/Treatments: Recommendations for Services/Supports/Treatments Recommendations For Services/Supports/Treatments: Facility Based Crisis, Individual Therapy, Medication Management  Discharge Disposition:    DSM5 Diagnoses: Patient Active Problem List   Diagnosis Date Noted   Schizoaffective disorder, bipolar type (HCC) 09/11/2021   History of OCD (obsessive compulsive disorder) 09/11/2021   MDD (major depressive disorder), recurrent episode, severe (HCC) 09/10/2021   Panic disorder 05/04/2021   Bipolar 1 disorder, depressed, severe (HCC) 05/03/2021   PTSD (post-traumatic stress disorder) 05/03/2021   OCD (obsessive compulsive disorder) 05/03/2021   Generalized anxiety disorder 12/02/2020   Major depressive disorder 12/02/2020   POTS (postural orthostatic tachycardia syndrome) 12/02/2020   Vitamin D deficiency 12/02/2020   Subclinical hypothyroidism  05/17/2017     Referrals to Alternative Service(s): Referred to Alternative Service(s):   Place:   Date:   Time:    Referred to Alternative Service(s):   Place:   Date:   Time:    Referred to Alternative Service(s):   Place:   Date:   Time:    Referred to Alternative Service(s):   Place:   Date:   Time:     Burnetta Sabin, [facility]

## 2021-09-26 ENCOUNTER — Ambulatory Visit (HOSPITAL_COMMUNITY): Payer: 59

## 2021-09-26 ENCOUNTER — Other Ambulatory Visit (HOSPITAL_COMMUNITY): Payer: 59

## 2021-09-26 LAB — COMPREHENSIVE METABOLIC PANEL
ALT: 13 U/L (ref 0–44)
AST: 18 U/L (ref 15–41)
Albumin: 3.7 g/dL (ref 3.5–5.0)
Alkaline Phosphatase: 84 U/L (ref 38–126)
Anion gap: 8 (ref 5–15)
BUN: 13 mg/dL (ref 6–20)
CO2: 21 mmol/L — ABNORMAL LOW (ref 22–32)
Calcium: 9.1 mg/dL (ref 8.9–10.3)
Chloride: 110 mmol/L (ref 98–111)
Creatinine, Ser: 1.22 mg/dL — ABNORMAL HIGH (ref 0.44–1.00)
GFR, Estimated: >60 mL/min (ref >60)
Glucose, Bld: 85 mg/dL (ref 70–99)
Potassium: 3.5 mmol/L (ref 3.5–5.1)
Sodium: 139 mmol/L (ref 135–145)
Total Bilirubin: 0.4 mg/dL (ref 0.3–1.2)
Total Protein: 6.6 g/dL (ref 6.5–8.1)

## 2021-09-26 LAB — CBC WITH DIFFERENTIAL/PLATELET
Abs Immature Granulocytes: 0.06 10*3/uL (ref 0.00–0.07)
Basophils Absolute: 0.1 10*3/uL (ref 0.0–0.1)
Basophils Relative: 1 %
Eosinophils Absolute: 0.2 10*3/uL (ref 0.0–0.5)
Eosinophils Relative: 1 %
HCT: 39.1 % (ref 36.0–46.0)
Hemoglobin: 13.2 g/dL (ref 12.0–15.0)
Immature Granulocytes: 0 %
Lymphocytes Relative: 22 %
Lymphs Abs: 3.3 10*3/uL (ref 0.7–4.0)
MCH: 29.6 pg (ref 26.0–34.0)
MCHC: 33.8 g/dL (ref 30.0–36.0)
MCV: 87.7 fL (ref 80.0–100.0)
Monocytes Absolute: 0.9 10*3/uL (ref 0.1–1.0)
Monocytes Relative: 6 %
Neutro Abs: 10.1 10*3/uL — ABNORMAL HIGH (ref 1.7–7.7)
Neutrophils Relative %: 70 %
Platelets: 273 10*3/uL (ref 150–400)
RBC: 4.46 MIL/uL (ref 3.87–5.11)
RDW: 12.7 % (ref 11.5–15.5)
WBC: 14.6 10*3/uL — ABNORMAL HIGH (ref 4.0–10.5)
nRBC: 0 % (ref 0.0–0.2)

## 2021-09-26 MED ORDER — ONDANSETRON HCL 4 MG PO TABS
4.0000 mg | ORAL_TABLET | Freq: Three times a day (TID) | ORAL | Status: DC | PRN
  Administered 2021-09-26: 4 mg via ORAL
  Filled 2021-09-26: qty 1

## 2021-09-26 MED ORDER — ZIPRASIDONE HCL 60 MG PO CAPS
60.0000 mg | ORAL_CAPSULE | Freq: Two times a day (BID) | ORAL | Status: DC
  Administered 2021-09-26 – 2021-09-28 (×4): 60 mg via ORAL
  Filled 2021-09-26 (×4): qty 1

## 2021-09-26 NOTE — ED Notes (Signed)
Pt stated that she was still experiencing SI and AVH. Stated, "The voices are telling me to hurt myself and I am seeing shadows". Accepted morning meds w/o difficulty. Breakfast provided prior to admin of meds. Midodrine not in pyxis, pharmacy notified via secure chat. Safety maintained and will continue to monitor.
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
## 2021-09-26 NOTE — ED Notes (Signed)
Received patient this PM. Patient is in her room sleeping. Patient respirations are even and unlabored. Will continue to monitor for safety.

## 2021-09-26 NOTE — ED Notes (Signed)
Patient is resting comfortably. 

## 2021-09-26 NOTE — ED Notes (Signed)
Pt was notified that lunch is ready 

## 2021-09-26 NOTE — ED Notes (Signed)
Patient continues to rest with no sxs of distress - will continue to monitor for safety ?

## 2021-09-26 NOTE — Progress Notes (Signed)
Virtual Visit via Video Note  I connected with Sabrina Bennett on 09/27/21 at  9:00 AM EDT by a video enabled telemedicine application and verified that I am speaking with the correct person using two identifiers.  Location: Patient: Home Provider: Office   I discussed the limitations of evaluation and management by telemedicine and the availability of in person appointments. The patient expressed understanding and agreed to proceed.   I discussed the assessment and treatment plan with the patient. The patient was provided an opportunity to ask questions and all were answered. The patient agreed with the plan and demonstrated an understanding of the instructions.   The patient was advised to call back or seek an in-person evaluation if the symptoms worsen or if the condition fails to improve as anticipated.  I provided 15 minutes of non-face-to-face time during this encounter.   Oneta Rack, NP   [facility] Partial Outpatient Program Discharge Summary  Sabrina Bennett [id number]  Admission date: 09/24/2021 Discharge date: 09/25/2021  Reason for admission: Per admission assessment note-Rekita Stroud is a 21 y.o. Caucasian female presents with depression and anxiety.  She reports intermittent passive suicidal ideations.  States she has been feeling really stressed and overwhelmed which is why she was admitted to [facility] behavioral health on last week.  States she had a medication adjustment but continues to report mood irritability.  States she is currently prescribed metformin, Topamax, Geodon and Zoloft.  She reports she has been taking medications as indicated.  Denies any medication side effects.  States her brother was recently diagnosed with depression and anxiety.  States she is unable to identify any recent or new stressors.  Does report auditory hallucinations.  Denies that they are command in nature.  Charted history with schizoaffective disorder, major  depressive disorder, and bipolar disorders.  Patient was enrolled in partial psychiatric program on 09/24/21.   Progress in Program Toward Treatment Goals: Progressing Patient continued to endorse suicidal ideation with plan and intent. She has been unable to contract for safety during this admission. NP attempted to follow-up with her boyfriend for safety planning. He reported he was going out of town for a few days. Patient stated she didn't feel safe.- she was reassessment and admitted to [facility] ( Facility Based Crisis)   Progress (rationale): - Patient to be referred for DBT at discharge.   Collaboration of Care: Medication Management AEB it was reported she had medication increased with Geodon and as been admitted to the [facility] for on going stabilization   Patient/Guardian was advised Release of Information must be obtained prior to any record release in order to collaborate their care with an outside provider. Patient/Guardian was advised if they have not already done so to contact the registration department to sign all necessary forms in order for [place] to release information regarding their care.   Consent: Patient/Guardian gives verbal consent for treatment and assignment of benefits for services provided during this visit. Patient/Guardian expressed understanding and agreed to proceed.   Oneta Rack, NP 09/27/2021

## 2021-09-26 NOTE — ED Provider Notes (Signed)
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 diabetes    Mean Plasma Glucose 09/11/2021 105.41  mg/dL Final   Performed at [facility], [address], [place], [place] [address]   Sodium 09/13/2021 140  135 - 145 mmol/L Final   Potassium 09/13/2021 3.9  3.5 - 5.1 mmol/L Final   Chloride  09/13/2021 113 (H)  98 - 111 mmol/L Final   CO2 09/13/2021 22  22 - 32 mmol/L Final   Glucose, Bld 09/13/2021 103 (H)  70 - 99 mg/dL Final   Glucose reference range applies only to samples taken after fasting for at least 8 hours.   BUN 09/13/2021 13  6 - 20 mg/dL Final   Creatinine, Ser 09/13/2021 1.06 (H)  0.44 - 1.00 mg/dL Final   Calcium 79/89/2119 8.8 (L)  8.9 - 10.3 mg/dL Final   GFR, Estimated 09/13/2021 >60  >60 mL/min Final   Comment: (NOTE) Calculated using the CKD-EPI Creatinine Equation (2021)    Anion gap 09/13/2021 5  5 - 15 Final   Performed at [facility], [address] [place], [place] [address]   HIV Screen 4th Generation wRfx 09/13/2021 Non Reactive  Non Reactive Final   Performed at [facility], [address], [place], [place] [address]   RPR Ser Ql 09/13/2021 NON REACTIVE  NON REACTIVE Final   Performed at [facility], [address], [place], [place] [address]   Chlamydia 09/12/2021 Negative   Final  Neisseria Gonorrhea 09/12/2021 Negative   Final   Comment 09/12/2021 Normal Reference Ranger Chlamydia - Negative   Final   Comment 09/12/2021 Normal Reference Range Neisseria Gonorrhea - Negative   Final   Color, Urine 09/12/2021 YELLOW  YELLOW Final   APPearance 09/12/2021 CLEAR  CLEAR Final   Specific Gravity, Urine 09/12/2021 1.026  1.005 - 1.030 Final   pH 09/12/2021 6.0  5.0 - 8.0 Final   Glucose, UA 09/12/2021 NEGATIVE  NEGATIVE mg/dL Final   Hgb urine dipstick 09/12/2021 NEGATIVE  NEGATIVE Final   Bilirubin Urine 09/12/2021 NEGATIVE  NEGATIVE Final   Ketones, ur 09/12/2021 NEGATIVE  NEGATIVE mg/dL Final   Protein, ur 40/98/1191 30 (A)  NEGATIVE mg/dL Final   Nitrite 47/82/9562 NEGATIVE  NEGATIVE Final   Leukocytes,Ua 09/12/2021 TRACE (A)  NEGATIVE Final   RBC / HPF 09/12/2021 0-5  0 - 5 RBC/hpf Final   WBC, UA 09/12/2021 0-5  0 - 5 WBC/hpf Final   Bacteria, UA 09/12/2021 NONE SEEN  NONE SEEN Final   Squamous Epithelial  / LPF 09/12/2021 0-5  0 - 5 Final   Mucus 09/12/2021 PRESENT   Final   Performed at [facility], [address], [place], [place] [address]   Vitamin B-12 09/13/2021 176 (L)  180 - 914 pg/mL Final   Comment: (NOTE) This assay is not validated for testing neonatal or myeloproliferative syndrome specimens for Vitamin B12 levels. Performed at [facility], [address], [place], [place] [address]    Sed Rate 09/13/2021 5  0 - 22 mm/hr Final   Performed at [facility], [address] [place], [place] [address]   Anti Nuclear Antibody (ANA) 09/13/2021 Negative  Negative Final   Comment: (NOTE) Performed At: [facility] [address] [place], [place] [id number] Jolene Schimke MD NU:[id number]   Admission on 08/24/2021, Discharged on 08/25/2021  Component Date Value Ref Range Status   SARS Coronavirus 2 by RT PCR 08/24/2021 NEGATIVE  NEGATIVE Final   Comment: (NOTE) SARS-CoV-2 target nucleic acids are NOT DETECTED.  The SARS-CoV-2 RNA is generally detectable in upper respiratory specimens during the acute phase of infection. The lowest concentration of SARS-CoV-2 viral copies this assay can detect is 138 copies/mL. A negative result does not preclude SARS-Cov-2 infection and should not be used as the sole basis for treatment or other patient management decisions. A negative result may occur with  improper specimen collection/handling, submission of specimen other than nasopharyngeal swab, presence of viral mutation(s) within the areas targeted by this assay, and inadequate number of viral copies(<138 copies/mL). A negative result must be combined with clinical observations, patient history, and epidemiological information. The expected result is Negative.  Fact Sheet for Patients:  BloggerCourse.com  Fact Sheet for Healthcare Providers:  SeriousBroker.it  This  test is no                          t yet approved or cleared by the [place] FDA and  has been authorized for detection and/or diagnosis of SARS-CoV-2 by FDA under an Emergency Use Authorization (EUA). This EUA will remain  in effect (meaning this test can be used) for the duration of the COVID-19 declaration under Section 564(b)(1) of the Act, 21 U.S.C.section 360bbb-3(b)(1), unless the authorization is terminated  or revoked sooner.       Influenza A by PCR 08/24/2021 NEGATIVE  NEGATIVE Final   Influenza B by PCR 08/24/2021  NEGATIVE  NEGATIVE Final   Comment: (NOTE) The Xpert Xpress SARS-CoV-2/FLU/RSV plus assay is intended as an aid in the diagnosis of influenza from Nasopharyngeal swab specimens and should not be used as a sole basis for treatment. Nasal washings and aspirates are unacceptable for Xpert Xpress SARS-CoV-2/FLU/RSV testing.  Fact Sheet for Patients: BloggerCourse.com  Fact Sheet for Healthcare Providers: SeriousBroker.it  This test is not yet approved or cleared by the [place] FDA and has been authorized for detection and/or diagnosis of SARS-CoV-2 by FDA under an Emergency Use Authorization (EUA). This EUA will remain in effect (meaning this test can be used) for the duration of the COVID-19 declaration under Section 564(b)(1) of the Act, 21 U.S.C. section 360bbb-3(b)(1), unless the authorization is terminated or revoked.  Performed at [facility], [address], [place], [place] [address]   Admission on 08/01/2021, Discharged on 08/01/2021  Component Date Value Ref Range Status   SARS Coronavirus 2 by RT PCR 08/01/2021 NEGATIVE  NEGATIVE Final   Comment: (NOTE) SARS-CoV-2 target nucleic acids are NOT DETECTED.  The SARS-CoV-2 RNA is generally detectable in upper respiratory specimens during the acute phase of infection. The lowest concentration of SARS-CoV-2 viral copies  this assay can detect is 138 copies/mL. A negative result does not preclude SARS-Cov-2 infection and should not be used as the sole basis for treatment or other patient management decisions. A negative result may occur with  improper specimen collection/handling, submission of specimen other than nasopharyngeal swab, presence of viral mutation(s) within the areas targeted by this assay, and inadequate number of viral copies(<138 copies/mL). A negative result must be combined with clinical observations, patient history, and epidemiological information. The expected result is Negative.  Fact Sheet for Patients:  BloggerCourse.com  Fact Sheet for Healthcare Providers:  SeriousBroker.it  This test is no                          t yet approved or cleared by the [place] FDA and  has been authorized for detection and/or diagnosis of SARS-CoV-2 by FDA under an Emergency Use Authorization (EUA). This EUA will remain  in effect (meaning this test can be used) for the duration of the COVID-19 declaration under Section 564(b)(1) of the Act, 21 U.S.C.section 360bbb-3(b)(1), unless the authorization is terminated  or revoked sooner.       Influenza A by PCR 08/01/2021 NEGATIVE  NEGATIVE Final   Influenza B by PCR 08/01/2021 NEGATIVE  NEGATIVE Final   Comment: (NOTE) The Xpert Xpress SARS-CoV-2/FLU/RSV plus assay is intended as an aid in the diagnosis of influenza from Nasopharyngeal swab specimens and should not be used as a sole basis for treatment. Nasal washings and aspirates are unacceptable for Xpert Xpress SARS-CoV-2/FLU/RSV testing.  Fact Sheet for Patients: BloggerCourse.com  Fact Sheet for Healthcare Providers: SeriousBroker.it  This test is not yet approved or cleared by the [place] FDA and has been authorized for detection and/or diagnosis of SARS-CoV-2 by FDA under  an Emergency Use Authorization (EUA). This EUA will remain in effect (meaning this test can be used) for the duration of the COVID-19 declaration under Section 564(b)(1) of the Act, 21 U.S.C. section 360bbb-3(b)(1), unless the authorization is terminated or revoked.  Performed at [facility], [address] [place], [place] [address]   Admission on 05/03/2021, Discharged on 05/08/2021  Component Date Value Ref Range Status   SARS Coronavirus 2 by RT PCR 05/03/2021 NEGATIVE  NEGATIVE Final   Comment: (NOTE) SARS-CoV-2 target nucleic acids are NOT DETECTED.  The SARS-CoV-2 RNA is generally detectable in upper respiratory specimens during the acute phase of infection. The lowest concentration of SARS-CoV-2 viral copies this assay can detect is 138 copies/mL. A negative result does not preclude SARS-Cov-2 infection and should not be used as the sole basis for treatment or other patient management decisions. A negative result may occur with  improper specimen collection/handling, submission of specimen other than nasopharyngeal swab, presence of viral mutation(s) within the areas targeted by this assay, and inadequate number of viral copies(<138 copies/mL). A negative result must be combined with clinical observations, patient history, and epidemiological information. The expected result is Negative.  Fact Sheet for Patients:  BloggerCourse.com  Fact Sheet for Healthcare Providers:  SeriousBroker.it  This test is no                          t yet approved or cleared by the [place] FDA and  has been authorized for detection and/or diagnosis of SARS-CoV-2 by FDA under an Emergency Use Authorization (EUA). This EUA will remain  in effect (meaning this test can be used) for the duration of the COVID-19 declaration under Section 564(b)(1) of the Act, 21 U.S.C.section 360bbb-3(b)(1), unless the authorization is  terminated  or revoked sooner.       Influenza A by PCR 05/03/2021 NEGATIVE  NEGATIVE Final   Influenza B by PCR 05/03/2021 NEGATIVE  NEGATIVE Final   Comment: (NOTE) The Xpert Xpress SARS-CoV-2/FLU/RSV plus assay is intended as an aid in the diagnosis of influenza from Nasopharyngeal swab specimens and should not be used as a sole basis for treatment. Nasal washings and aspirates are unacceptable for Xpert Xpress SARS-CoV-2/FLU/RSV testing.  Fact Sheet for Patients: BloggerCourse.com  Fact Sheet for Healthcare Providers: SeriousBroker.it  This test is not yet approved or cleared by the [place] FDA and has been authorized for detection and/or diagnosis of SARS-CoV-2 by FDA under an Emergency Use Authorization (EUA). This EUA will remain in effect (meaning this test can be used) for the duration of the COVID-19 declaration under Section 564(b)(1) of the Act, 21 U.S.C. section 360bbb-3(b)(1), unless the authorization is terminated or revoked.  Performed at [facility], [address], [place], [place] [address]    WBC 05/03/2021 8.8  4.0 - 10.5 K/uL Final   RBC 05/03/2021 4.27  3.87 - 5.11 MIL/uL Final   Hemoglobin 05/03/2021 13.2  12.0 - 15.0 g/dL Final   HCT 60/45/4098 40.8  36.0 - 46.0 % Final   MCV 05/03/2021 95.6  80.0 - 100.0 fL Final   MCH 05/03/2021 30.9  26.0 - 34.0 pg Final   MCHC 05/03/2021 32.4  30.0 - 36.0 g/dL Final   RDW 11/91/4782 12.3  11.5 - 15.5 % Final   Platelets 05/03/2021 207  150 - 400 K/uL Final   nRBC 05/03/2021 0.0  0.0 - 0.2 % Final   Performed at [facility], [address], [place], [place] [address]   Sodium 05/03/2021 138  135 - 145 mmol/L Final   Potassium 05/03/2021 4.1  3.5 - 5.1 mmol/L Final   Chloride 05/03/2021 106  98 - 111 mmol/L Final   CO2 05/03/2021 26  22 - 32 mmol/L Final   Glucose, Bld 05/03/2021 114 (H)  70 - 99 mg/dL Final    Glucose reference range applies only to samples taken after fasting for at  least 8 hours.   BUN 05/03/2021 14  6 - 20 mg/dL Final   Creatinine, Ser 05/03/2021 1.02 (H)  0.44 - 1.00 mg/dL Final   Calcium 16/10/960412/28/2022 9.2  8.9 - 10.3 mg/dL Final   Total Protein 54/09/811912/28/2022 6.8  6.5 - 8.1 g/dL Final   Albumin 14/78/295612/28/2022 3.9  3.5 - 5.0 g/dL Final   AST 21/30/865712/28/2022 18  15 - 41 U/L Final   ALT 05/03/2021 11  0 - 44 U/L Final   Alkaline Phosphatase 05/03/2021 55  38 - 126 U/L Final   Total Bilirubin 05/03/2021 0.3  0.3 - 1.2 mg/dL Final   GFR, Estimated 05/03/2021 >60  >60 mL/min Final   Comment: (NOTE) Calculated using the CKD-EPI Creatinine Equation (2021)    Anion gap 05/03/2021 6  5 - 15 Final   Performed at [facility], [address] [place], [place] [address]   Hgb A1c MFr Bld 05/03/2021 5.0  4.8 - 5.6 % Final   Comment: (NOTE) Pre diabetes:          5.7%-6.4%  Diabetes:              >6.4%  Glycemic control for   <7.0% adults with diabetes    Mean Plasma Glucose 05/03/2021 96.8  mg/dL Final   Performed at [facility], [address] [place], [place] [address]   Cholesterol 05/03/2021 140  0 - 200 mg/dL Final   Triglycerides 41/32/440112/28/2022 47  <150 mg/dL Final   HDL 02/72/536612/28/2022 47  >40 mg/dL Final   Total CHOL/HDL Ratio 05/03/2021 3.0  RATIO Final   VLDL 05/03/2021 9  0 - 40 mg/dL Final   LDL Cholesterol 05/03/2021 84  0 - 99 mg/dL Final   Comment:        Total Cholesterol/HDL:CHD Risk Coronary Heart Disease Risk Table                     Men   Women  1/2 Average Risk   3.4   3.3  Average Risk       5.0   4.4  2 X Average Risk   9.6   7.1  3 X Average Risk  23.4   11.0        Use the calculated Patient Ratio above and the CHD Risk Table to determine the patient's CHD Risk.        ATP III CLASSIFICATION (LDL):  <100     mg/dL   Optimal  [id number]  mg/dL   Near or Above                    Optimal  130-159  mg/dL   Borderline  [id number]  mg/dL   High   >387>190     mg/dL   Very High Performed at [facility], [address] [place], [place] [address]    TSH 05/03/2021 6.815 (H)  0.350 - 4.500 uIU/mL Final   Comment: Performed by a 3rd Generation assay with a functional sensitivity of <=0.01 uIU/mL. Performed at [facility], [address] [place], [place] [address]    Preg Test, Ur 05/03/2021 NEGATIVE  NEGATIVE Final   Comment:        THE SENSITIVITY OF THIS METHODOLOGY IS >20 mIU/mL. Performed at [facility], [address] [place], [place] [address]    Opiates 05/03/2021 NONE DETECTED  NONE DETECTED Final   Cocaine 05/03/2021 NONE DETECTED  NONE DETECTED Final   Benzodiazepines 05/03/2021 NONE DETECTED  NONE DETECTED Final   Amphetamines 05/03/2021 NONE DETECTED  NONE DETECTED Final   Tetrahydrocannabinol 05/03/2021 NONE DETECTED  NONE DETECTED Final   Barbiturates 05/03/2021 NONE DETECTED  NONE DETECTED Final   Comment: (NOTE) DRUG SCREEN FOR MEDICAL PURPOSES ONLY.  IF CONFIRMATION IS NEEDED FOR ANY PURPOSE, NOTIFY LAB WITHIN 5 DAYS.  LOWEST DETECTABLE LIMITS FOR URINE DRUG SCREEN Drug Class                     Cutoff (ng/mL) Amphetamine and metabolites    1000 Barbiturate and metabolites    200 Benzodiazepine                 200 Tricyclics and metabolites     300 Opiates and metabolites        300 Cocaine and metabolites        300 THC                            50 Performed at [facility], [address] [place], [place] [address]    Lithium Lvl 05/03/2021 0.17 (L)  0.60 - 1.20 mmol/L Final   Performed at [facility], [address] [place], [place] [address]   Lithium Lvl 05/07/2021 0.46 (L)  0.60 - 1.20 mmol/L Final   Performed at [facility], [address] [place], [place] [address]   Sodium 05/07/2021 138  135 - 145 mmol/L Final   Potassium 05/07/2021 3.9  3.5 - 5.1 mmol/L Final   Chloride  05/07/2021 107  98 - 111 mmol/L Final   CO2 05/07/2021 25  22 - 32 mmol/L Final   Glucose, Bld 05/07/2021 100 (H)  70 - 99 mg/dL Final   Glucose reference range applies only to samples taken after fasting for at least 8 hours.   BUN 05/07/2021 13  6 - 20 mg/dL Final   Creatinine, Ser 05/07/2021 1.07 (H)  0.44 - 1.00 mg/dL Final   Calcium 91/47/8295 9.2  8.9 - 10.3 mg/dL Final   GFR, Estimated 05/07/2021 >60  >60 mL/min Final   Comment: (NOTE) Calculated using the CKD-EPI Creatinine Equation (2021)    Anion gap 05/07/2021 6  5 - 15 Final   Performed at [facility], [address], [place], [place] [address]   TSH 05/07/2021 7.483 (H)  0.350 - 4.500 uIU/mL Final   Comment: Performed by a 3rd Generation assay with a functional sensitivity of <=0.01 uIU/mL. Performed at [facility], [address] [place], [place] [address]     Allergies: Ferrous sulfate  PTA Medications:  Prior to Admission medications   Medication Sig Start Date End Date Taking? Authorizing Provider  Cyanocobalamin (VITAMIN B-12) 50 MCG tablet Take 1 tablet (50 mcg total) by mouth daily. 09/19/21 10/19/21 Yes Massengill, Harrold Donath, MD  levothyroxine (SYNTHROID) 75 MCG tablet Take 1 tablet (75 mcg total) by mouth daily. 05/09/21  Yes Massengill, Harrold Donath, MD  metFORMIN (GLUCOPHAGE) 500 MG tablet Take 1 tablet (500 mg total) by mouth daily with breakfast. 09/19/21 10/19/21 Yes Massengill, Harrold Donath, MD  midodrine (PROAMATINE) 2.5 MG tablet Take 2.5 mg by mouth 2 (two) times daily. 04/18/21  Yes [provider]  sertraline (ZOLOFT) 50 MG tablet Take 1 tablet (50 mg total) by mouth daily. 09/19/21 10/19/21 Yes Massengill, Harrold Donath, MD  SIMPESSE 0.15-0.03 &0.01 MG tablet Take 1  tablet by mouth at bedtime. 05/02/21  Yes [provider]  topiramate (TOPAMAX) 25 MG tablet Take 1 tablet (25 mg total) by mouth 2 (two) times daily. 09/18/21 10/18/21 Yes Massengill, Harrold Donath, MD  viloxazine ER  (QELBREE) 200 MG 24 hr capsule Take 200 mg by mouth daily.   Yes [provider]  ziprasidone (GEODON) 40 MG capsule Take 1 capsule (40 mg total) by mouth 2 (two) times daily with a meal. 09/18/21 10/18/21 Yes Massengill, Harrold Donath, MD      Long Term Goals: Improvement in symptoms so as ready for discharge  Short Term Goals: Ability to identify changes in lifestyle to reduce recurrence of condition will improve, Ability to verbalize feelings will improve, Ability to demonstrate self-control will improve, Ability to identify and develop effective coping behaviors will improve, and Ability to identify triggers associated with substance abuse/mental health issues will improve  Medical Decision Making  Continue admission in [facility] for stabilization   Reviewed PTA medications with patient, will continue as noted below. 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 -Sertraline 50 mg daily for depression -Ramelteon 8 mg daily for insomnia -Cyanocobalamin 50 mcg daily for nutritional supplementation -Hydroxyzine 25 mg 3 times daily as needed for anxiety -SIMPESSE 0.15-0.03 &0.01 MG tablet QHS for birth control  Medication changes: -Increase ziprasidone 60 mg twice daily with meals for AVH/mood stability on 09/26/2021  Lab Orders         CBC with Differential/Platelet         Comprehensive metabolic panel         POCT Urine Drug Screen - (I-Screen)         Pregnancy, urine POC          Recommendations  Based on my evaluation the patient does not appear to have an emergency medical condition.  Please provide patient with DBT workbook  I have assigned patient to make a structured schedule for 4 days to help her have control when she is home alone. Second assignment to create a plan to have control over her ruminating suicidal  thoughts and auditory hallucinations.   Patient may benefit from long-term psychiatric facility.  I have asked social work to reach out to patient and mother in regards to a residential psychiatric facility.  Patient would be able to get mother's consent from the application on line, and we had placed referral if needed.  Mariel Craft, MD 09/26/21  4:09 PM

## 2021-09-27 ENCOUNTER — Ambulatory Visit (HOSPITAL_COMMUNITY): Payer: 59

## 2021-09-27 ENCOUNTER — Other Ambulatory Visit (HOSPITAL_COMMUNITY): Payer: 59

## 2021-09-27 ENCOUNTER — Encounter (HOSPITAL_COMMUNITY): Payer: Self-pay | Admitting: Nurse Practitioner

## 2021-09-27 ENCOUNTER — Other Ambulatory Visit: Payer: Self-pay

## 2021-09-27 ENCOUNTER — Encounter (HOSPITAL_COMMUNITY): Payer: Self-pay

## 2021-09-27 ENCOUNTER — Encounter (HOSPITAL_COMMUNITY): Payer: Self-pay | Admitting: Family

## 2021-09-27 NOTE — ED Provider Notes (Signed)
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 [place] [address]   Sodium 09/13/2021 140  135 - 145 mmol/L Final   Potassium 09/13/2021 3.9  3.5 - 5.1 mmol/L Final   Chloride 09/13/2021 113 (H)  98 - 111 mmol/L Final   CO2 09/13/2021 22  22 - 32 mmol/L Final   Glucose, Bld 09/13/2021 103 (H)  70 - 99 mg/dL Final   Glucose reference  range applies only to samples taken after fasting for at least 8 hours.   BUN 09/13/2021 13  6 - 20 mg/dL Final   Creatinine, Ser 09/13/2021 1.06 (H)  0.44 - 1.00 mg/dL Final   Calcium 29/56/2130 8.8 (L)  8.9 - 10.3 mg/dL Final   GFR, Estimated 09/13/2021 >60  >60 mL/min Final   Comment: (NOTE) Calculated using the CKD-EPI Creatinine Equation (2021)    Anion gap 09/13/2021 5  5 - 15 Final   Performed at [facility], [address] [place], [place] [address]   HIV Screen 4th Generation wRfx 09/13/2021 Non Reactive  Non Reactive Final   Performed at [facility], [address], [place], [place] [address]   RPR Ser Ql 09/13/2021 NON REACTIVE  NON REACTIVE Final   Performed at [facility], [address], [place], [place] [address]   Chlamydia 09/12/2021 Negative   Final   Neisseria Gonorrhea 09/12/2021 Negative   Final   Comment 09/12/2021 Normal Reference Ranger Chlamydia - Negative   Final   Comment 09/12/2021 Normal Reference Range Neisseria Gonorrhea - Negative   Final   Color, Urine 09/12/2021 YELLOW  YELLOW Final   APPearance 09/12/2021 CLEAR  CLEAR Final   Specific Gravity, Urine 09/12/2021 1.026  1.005 - 1.030 Final   pH 09/12/2021 6.0  5.0 - 8.0 Final   Glucose, UA 09/12/2021 NEGATIVE  NEGATIVE mg/dL Final   Hgb urine dipstick 09/12/2021 NEGATIVE  NEGATIVE Final   Bilirubin Urine 09/12/2021 NEGATIVE  NEGATIVE Final   Ketones, ur 09/12/2021 NEGATIVE  NEGATIVE mg/dL Final   Protein, ur 16/02/9603 30 (A)  NEGATIVE mg/dL Final   Nitrite 54/01/8118 NEGATIVE  NEGATIVE Final   Leukocytes,Ua 09/12/2021 TRACE (A)  NEGATIVE Final   RBC / HPF 09/12/2021 0-5  0 - 5 RBC/hpf Final   WBC, UA 09/12/2021 0-5  0 - 5 WBC/hpf Final   Bacteria, UA 09/12/2021 NONE SEEN  NONE SEEN Final   Squamous Epithelial / LPF 09/12/2021 0-5  0 - 5 Final   Mucus 09/12/2021 PRESENT   Final   Performed at [facility], [address], [place], [place] [address]    Vitamin B-12 09/13/2021 176 (L)  180 - 914 pg/mL Final   Comment: (NOTE) This assay is not validated for testing neonatal or myeloproliferative syndrome specimens for Vitamin B12 levels. Performed at [facility], [address], [place], [place] [address]    Sed Rate 09/13/2021 5  0 - 22 mm/hr Final   Performed at [facility], [address], [place], [place] [address]   Anti Nuclear Antibody (ANA) 09/13/2021 Negative  Negative Final   Comment: (NOTE) Performed At: [facility] [address] [place], [place] [id number] Jolene Schimke MD MW:[id number]   Admission on 08/24/2021, Discharged on 08/25/2021  Component Date Value Ref Range Status   SARS Coronavirus 2 by RT PCR 08/24/2021 NEGATIVE  NEGATIVE Final   Comment: (NOTE) SARS-CoV-2 target nucleic acids are NOT DETECTED.  The SARS-CoV-2 RNA is generally detectable in upper respiratory specimens during the acute phase of infection. The lowest concentration of SARS-CoV-2 viral copies this assay can detect is 138 copies/mL. A negative result does not preclude SARS-Cov-2 infection and should not be used as the sole basis for treatment or other patient management decisions. A negative result may occur with  improper specimen collection/handling, submission of specimen other than nasopharyngeal swab, presence of viral mutation(s) within the areas targeted by this assay, and inadequate number of viral copies(<138 copies/mL). A negative result must be combined with clinical observations, patient history, and epidemiological information. The expected result is Negative.  Fact Sheet for Patients:  BloggerCourse.com  Fact Sheet for Healthcare Providers:  SeriousBroker.it  This test is no                          t yet approved or cleared by the [place] FDA and  has been authorized for detection and/or diagnosis of SARS-CoV-2  by FDA under an Emergency Use Authorization (EUA). This EUA will remain  in effect (meaning this test can be used) for the duration of the COVID-19 declaration under Section 564(b)(1) of the Act, 21 U.S.C.section 360bbb-3(b)(1), unless the authorization is terminated  or revoked sooner.       Influenza A by PCR 08/24/2021 NEGATIVE  NEGATIVE Final   Influenza B by PCR 08/24/2021 NEGATIVE  NEGATIVE Final   Comment: (NOTE) The Xpert Xpress SARS-CoV-2/FLU/RSV plus assay is intended as an aid in the diagnosis of influenza from Nasopharyngeal swab specimens and should not be used as a sole basis for treatment. Nasal washings  and aspirates are unacceptable for Xpert Xpress SARS-CoV-2/FLU/RSV testing.  Fact Sheet for Patients: BloggerCourse.com  Fact Sheet for Healthcare Providers: SeriousBroker.it  This test is not yet approved or cleared by the [place] FDA and has been authorized for detection and/or diagnosis of SARS-CoV-2 by FDA under an Emergency Use Authorization (EUA). This EUA will remain in effect (meaning this test can be used) for the duration of the COVID-19 declaration under Section 564(b)(1) of the Act, 21 U.S.C. section 360bbb-3(b)(1), unless the authorization is terminated or revoked.  Performed at [facility], [address], [place], [place] [address]   Admission on 08/01/2021, Discharged on 08/01/2021  Component Date Value Ref Range Status   SARS Coronavirus 2 by RT PCR 08/01/2021 NEGATIVE  NEGATIVE Final   Comment: (NOTE) SARS-CoV-2 target nucleic acids are NOT DETECTED.  The SARS-CoV-2 RNA is generally detectable in upper respiratory specimens during the acute phase of infection. The lowest concentration of SARS-CoV-2 viral copies this assay can detect is 138 copies/mL. A negative result does not preclude SARS-Cov-2 infection and should not be used as the sole basis for treatment  or other patient management decisions. A negative result may occur with  improper specimen collection/handling, submission of specimen other than nasopharyngeal swab, presence of viral mutation(s) within the areas targeted by this assay, and inadequate number of viral copies(<138 copies/mL). A negative result must be combined with clinical observations, patient history, and epidemiological information. The expected result is Negative.  Fact Sheet for Patients:  BloggerCourse.com  Fact Sheet for Healthcare Providers:  SeriousBroker.it  This test is no                          t yet approved or cleared by the [place] FDA and  has been authorized for detection and/or diagnosis of SARS-CoV-2 by FDA under an Emergency Use Authorization (EUA). This EUA will remain  in effect (meaning this test can be used) for the duration of the COVID-19 declaration under Section 564(b)(1) of the Act, 21 U.S.C.section 360bbb-3(b)(1), unless the authorization is terminated  or revoked sooner.       Influenza A by PCR 08/01/2021 NEGATIVE  NEGATIVE Final   Influenza B by PCR 08/01/2021 NEGATIVE  NEGATIVE Final   Comment: (NOTE) The Xpert Xpress SARS-CoV-2/FLU/RSV plus assay is intended as an aid in the diagnosis of influenza from Nasopharyngeal swab specimens and should not be used as a sole basis for treatment. Nasal washings and aspirates are unacceptable for Xpert Xpress SARS-CoV-2/FLU/RSV testing.  Fact Sheet for Patients: BloggerCourse.com  Fact Sheet for Healthcare Providers: SeriousBroker.it  This test is not yet approved or cleared by the [place] FDA and has been authorized for detection and/or diagnosis of SARS-CoV-2 by FDA under an Emergency Use Authorization (EUA). This EUA will remain in effect (meaning this test can be used) for the duration of the COVID-19 declaration under  Section 564(b)(1) of the Act, 21 U.S.C. section 360bbb-3(b)(1), unless the authorization is terminated or revoked.  Performed at [facility], [address], [place], [place] [address]   Admission on 05/03/2021, Discharged on 05/08/2021  Component Date Value Ref Range Status   SARS Coronavirus 2 by RT PCR 05/03/2021 NEGATIVE  NEGATIVE Final   Comment: (NOTE) SARS-CoV-2 target nucleic acids are NOT DETECTED.  The SARS-CoV-2 RNA is generally detectable in upper respiratory specimens during the acute phase of infection. The lowest concentration of SARS-CoV-2 viral copies this assay can detect  is 138 copies/mL. A negative result does not preclude SARS-Cov-2 infection and should not be used as the sole basis for treatment or other patient management decisions. A negative result may occur with  improper specimen collection/handling, submission of specimen other than nasopharyngeal swab, presence of viral mutation(s) within the areas targeted by this assay, and inadequate number of viral copies(<138 copies/mL). A negative result must be combined with clinical observations, patient history, and epidemiological information. The expected result is Negative.  Fact Sheet for Patients:  BloggerCourse.com  Fact Sheet for Healthcare Providers:  SeriousBroker.it  This test is no                          t yet approved or cleared by the [place] FDA and  has been authorized for detection and/or diagnosis of SARS-CoV-2 by FDA under an Emergency Use Authorization (EUA). This EUA will remain  in effect (meaning this test can be used) for the duration of the COVID-19 declaration under Section 564(b)(1) of the Act, 21 U.S.C.section 360bbb-3(b)(1), unless the authorization is terminated  or revoked sooner.       Influenza A by PCR 05/03/2021 NEGATIVE  NEGATIVE Final   Influenza B by PCR 05/03/2021 NEGATIVE  NEGATIVE Final    Comment: (NOTE) The Xpert Xpress SARS-CoV-2/FLU/RSV plus assay is intended as an aid in the diagnosis of influenza from Nasopharyngeal swab specimens and should not be used as a sole basis for treatment. Nasal washings and aspirates are unacceptable for Xpert Xpress SARS-CoV-2/FLU/RSV testing.  Fact Sheet for Patients: BloggerCourse.com  Fact Sheet for Healthcare Providers: SeriousBroker.it  This test is not yet approved or cleared by the [place] FDA and has been authorized for detection and/or diagnosis of SARS-CoV-2 by FDA under an Emergency Use Authorization (EUA). This EUA will remain in effect (meaning this test can be used) for the duration of the COVID-19 declaration under Section 564(b)(1) of the Act, 21 U.S.C. section 360bbb-3(b)(1), unless the authorization is terminated or revoked.  Performed at [facility], [address], [place], [place] [address]    WBC 05/03/2021 8.8  4.0 - 10.5 K/uL Final   RBC 05/03/2021 4.27  3.87 - 5.11 MIL/uL Final   Hemoglobin 05/03/2021 13.2  12.0 - 15.0 g/dL Final   HCT 66/44/0347 40.8  36.0 - 46.0 % Final   MCV 05/03/2021 95.6  80.0 - 100.0 fL Final   MCH 05/03/2021 30.9  26.0 - 34.0 pg Final   MCHC 05/03/2021 32.4  30.0 - 36.0 g/dL Final   RDW 42/59/5638 12.3  11.5 - 15.5 % Final   Platelets 05/03/2021 207  150 - 400 K/uL Final   nRBC 05/03/2021 0.0  0.0 - 0.2 % Final   Performed at [facility], [address] [place], [place] [address]   Sodium 05/03/2021 138  135 - 145 mmol/L Final   Potassium 05/03/2021 4.1  3.5 - 5.1 mmol/L Final   Chloride 05/03/2021 106  98 - 111 mmol/L Final   CO2 05/03/2021 26  22 - 32 mmol/L Final   Glucose, Bld 05/03/2021 114 (H)  70 - 99 mg/dL Final   Glucose reference range applies only to samples taken after fasting for at least 8 hours.   BUN 05/03/2021 14  6 - 20 mg/dL Final   Creatinine, Ser 05/03/2021  1.02 (H)  0.44 - 1.00 mg/dL Final   Calcium 32/95/1884 9.2  8.9 - 10.3 mg/dL Final   Total  Protein 05/03/2021 6.8  6.5 - 8.1 g/dL Final   Albumin 16/02/9603 3.9  3.5 - 5.0 g/dL Final   AST 54/01/8118 18  15 - 41 U/L Final   ALT 05/03/2021 11  0 - 44 U/L Final   Alkaline Phosphatase 05/03/2021 55  38 - 126 U/L Final   Total Bilirubin 05/03/2021 0.3  0.3 - 1.2 mg/dL Final   GFR, Estimated 05/03/2021 >60  >60 mL/min Final   Comment: (NOTE) Calculated using the CKD-EPI Creatinine Equation (2021)    Anion gap 05/03/2021 6  5 - 15 Final   Performed at [facility], [address], [place], [place] [address]   Hgb A1c MFr Bld 05/03/2021 5.0  4.8 - 5.6 % Final   Comment: (NOTE) Pre diabetes:          5.7%-6.4%  Diabetes:              >6.4%  Glycemic control for   <7.0% adults with diabetes    Mean Plasma Glucose 05/03/2021 96.8  mg/dL Final   Performed at [facility], [address], [place], [place] [address]   Cholesterol 05/03/2021 140  0 - 200 mg/dL Final   Triglycerides 30/86/5784 47  <150 mg/dL Final   HDL 69/62/9528 47  >40 mg/dL Final   Total CHOL/HDL Ratio 05/03/2021 3.0  RATIO Final   VLDL 05/03/2021 9  0 - 40 mg/dL Final   LDL Cholesterol 05/03/2021 84  0 - 99 mg/dL Final   Comment:        Total Cholesterol/HDL:CHD Risk Coronary Heart Disease Risk Table                     Men   Women  1/2 Average Risk   3.4   3.3  Average Risk       5.0   4.4  2 X Average Risk   9.6   7.1  3 X Average Risk  23.4   11.0        Use the calculated Patient Ratio above and the CHD Risk Table to determine the patient's CHD Risk.        ATP III CLASSIFICATION (LDL):  <100     mg/dL   Optimal  413-244  mg/dL   Near or Above                    Optimal  130-159  mg/dL   Borderline  010-272  mg/dL   High  >536     mg/dL   Very High Performed at [facility], [address], [place], [place] [address]    TSH 05/03/2021 6.815 (H)  0.350 - 4.500  uIU/mL Final   Comment: Performed by a 3rd Generation assay with a functional sensitivity of <=0.01 uIU/mL. Performed at [facility], [address], [place], [place] [address]    Preg Test, Ur 05/03/2021 NEGATIVE  NEGATIVE Final   Comment:        THE SENSITIVITY OF THIS METHODOLOGY IS >20 mIU/mL. Performed at [facility], [address], [place], [place] [address]    Opiates 05/03/2021 NONE DETECTED  NONE DETECTED Final   Cocaine 05/03/2021 NONE DETECTED  NONE DETECTED Final   Benzodiazepines 05/03/2021 NONE DETECTED  NONE DETECTED Final   Amphetamines 05/03/2021 NONE DETECTED  NONE DETECTED Final   Tetrahydrocannabinol 05/03/2021 NONE DETECTED  NONE DETECTED Final   Barbiturates 05/03/2021 NONE DETECTED  NONE  DETECTED Final   Comment: (NOTE) DRUG SCREEN FOR MEDICAL PURPOSES ONLY.  IF CONFIRMATION IS NEEDED FOR ANY PURPOSE, NOTIFY LAB WITHIN 5 DAYS.  LOWEST DETECTABLE LIMITS FOR URINE DRUG SCREEN Drug Class                     Cutoff (ng/mL) Amphetamine and metabolites    1000 Barbiturate and metabolites    200 Benzodiazepine                 200 Tricyclics and metabolites     300 Opiates and metabolites        300 Cocaine and metabolites        300 THC                            50 Performed at [facility], [address], [place], [place] [address]    Lithium Lvl 05/03/2021 0.17 (L)  0.60 - 1.20 mmol/L Final   Performed at [facility], [address], [place], [place] [address]   Lithium Lvl 05/07/2021 0.46 (L)  0.60 - 1.20 mmol/L Final   Performed at [facility], [address] [place], [place] [address]   Sodium 05/07/2021 138  135 - 145 mmol/L Final   Potassium 05/07/2021 3.9  3.5 - 5.1 mmol/L Final   Chloride 05/07/2021 107  98 - 111 mmol/L Final   CO2 05/07/2021 25  22 - 32 mmol/L Final   Glucose, Bld 05/07/2021 100 (H)  70 - 99 mg/dL Final   Glucose reference range  applies only to samples taken after fasting for at least 8 hours.   BUN 05/07/2021 13  6 - 20 mg/dL Final   Creatinine, Ser 05/07/2021 1.07 (H)  0.44 - 1.00 mg/dL Final   Calcium 13/12/6576 9.2  8.9 - 10.3 mg/dL Final   GFR, Estimated 05/07/2021 >60  >60 mL/min Final   Comment: (NOTE) Calculated using the CKD-EPI Creatinine Equation (2021)    Anion gap 05/07/2021 6  5 - 15 Final   Performed at [facility], [address] [place], [place] [address]   TSH 05/07/2021 7.483 (H)  0.350 - 4.500 uIU/mL Final   Comment: Performed by a 3rd Generation assay with a functional sensitivity of <=0.01 uIU/mL. Performed at [facility], [address] [place], [place] [address]     Allergies: Ferrous sulfate  PTA Medications:  Prior to Admission medications   Medication Sig Start Date End Date Taking? Authorizing Provider  Cyanocobalamin (VITAMIN B-12) 50 MCG tablet Take 1 tablet (50 mcg total) by mouth daily. 09/19/21 10/19/21 Yes Massengill, Harrold Donath, MD  levothyroxine (SYNTHROID) 75 MCG tablet Take 1 tablet (75 mcg total) by mouth daily. 05/09/21  Yes Massengill, Harrold Donath, MD  metFORMIN (GLUCOPHAGE) 500 MG tablet Take 1 tablet (500 mg total) by mouth daily with breakfast. 09/19/21 10/19/21 Yes Massengill, Harrold Donath, MD  midodrine (PROAMATINE) 2.5 MG tablet Take 2.5 mg by mouth 2 (two) times daily. 04/18/21  Yes [provider]  sertraline (ZOLOFT) 50 MG tablet Take 1 tablet (50 mg total) by mouth daily. 09/19/21 10/19/21 Yes Massengill, Harrold Donath, MD  SIMPESSE 0.15-0.03 &0.01 MG tablet Take 1 tablet by mouth at bedtime. 05/02/21  Yes [provider]  topiramate (TOPAMAX) 25 MG tablet Take 1 tablet (25 mg total) by mouth 2 (two) times daily. 09/18/21 10/18/21 Yes Massengill, Harrold Donath, MD  viloxazine ER (QELBREE) 200 MG  24 hr capsule Take 200 mg by mouth daily.   Yes [provider]  ziprasidone (GEODON) 40 MG capsule Take 1 capsule (40 mg total) by mouth 2  (two) times daily with a meal. 09/18/21 10/18/21 Yes Massengill, Harrold Donath, MD      Long Term Goals: Improvement in symptoms so as ready for discharge  Short Term Goals: Ability to identify changes in lifestyle to reduce recurrence of condition will improve, Ability to verbalize feelings will improve, Ability to demonstrate self-control will improve, Ability to identify and develop effective coping behaviors will improve, and Ability to identify triggers associated with substance abuse/mental health issues will improve  Medical Decision Making  Continue admission in [facility] for stabilization   Reviewed PTA medications with patient, will continue as noted below. 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 -Sertraline 50 mg daily for depression -Ramelteon 8 mg daily for insomnia -Cyanocobalamin 50 mcg daily for nutritional supplementation -Hydroxyzine 25 mg 3 times daily as needed for anxiety -SIMPESSE 0.15-0.03 &0.01 MG tablet QHS for birth control  -ziprasidone 60 mg twice daily with meals for AVH/mood stability (increased on 09/26/2021)  Lab Orders         CBC with Differential/Platelet         Comprehensive metabolic panel         POCT Urine Drug Screen - (I-Screen)         Pregnancy, urine POC          Recommendations  Based on my evaluation the patient does not appear to have an emergency medical condition.  Please provide patient with DBT workbook, and encourage patient to use. Encourage patient to take accountability for completing her DBT assignments.  I have assigned and reviewed patient to make a structured schedule for 4 days to help her have control when she is home alone. Second assignment to create a plan to have control over her ruminating suicidal thoughts and auditory hallucinations.  Patient may benefit from  long-term psychiatric facility versus in person partial hospitalization program.  LCSW has provided resources to patient and mother in regards to [facility] and a residential psychiatric facility.   Patient and mother intend to complete application forms for referral.    Mariel Craft, MD 09/27/21  4:33 PM

## 2021-09-27 NOTE — ED Notes (Signed)
Patient reports SI with no pan or intent. Patient reports AH stating she hears voices telling her to self harm. Patient verbally contract for safety while on unit. Patient also reports VH stating " I see shadows". Patient denies HI. Patient currently reading book as distractions techniques. Respirations equal and unlabored, skin warm and dry. No acute distress noted. Will continue to monitor patient.

## 2021-09-27 NOTE — ED Notes (Signed)
Notified pt that breakfast is ready 

## 2021-09-27 NOTE — ED Notes (Signed)
Patient resting with eyes closed. Respirations equal and unlabored,skin warm and dry. No acute distress noted. Will continue to monitor patient.

## 2021-09-27 NOTE — ED Notes (Signed)
Pt is in room sleeping

## 2021-09-27 NOTE — ED Notes (Signed)
Patient sitting in lounge area. Patient calm and cooperative at this time. Patient continue have passive SI no plan or intent. Patient continues to reports AVH. Patient states the voices are continuing to tell her to do self harm. Patient verbally contracts for safety while on unit. Patient states she is still having visual hallucinations. Patient states that she still see shadows. Patient has a book to read for distractions techniques. Respirations equal and unlabored, skin warm and dry. No acute distress noted. Will continue to monitor patient.

## 2021-09-27 NOTE — ED Notes (Signed)
Pt is currently sleeping, no distress noted, environmental check complete, will continue to monitor patient for safety. ? ?

## 2021-09-27 NOTE — ED Notes (Signed)
Pt has taken night time medications and is now lying in bed quietly no distress noted

## 2021-09-27 NOTE — BH IP Treatment Plan (Signed)
Interdisciplinary Treatment and Diagnostic Plan Update  09/27/2021 Time of Session: 10:10AM Sabrina BolognaSabrina Bennett MRN: [id number]  Diagnosis:  Final diagnoses:  Schizoaffective disorder, bipolar type (HCC)  PTSD (post-traumatic stress disorder)  History of OCD (obsessive compulsive disorder)  Non-suicidal self-harm (HCC)     Current Medications:  Current Facility-Administered Medications  Medication Dose Route Frequency Provider Last Rate Last Admin   acetaminophen (TYLENOL) tablet 650 mg  650 mg Oral Q6H PRN Jackelyn PolingBerry, Jason A, NP       alum & mag hydroxide-simeth (MAALOX/MYLANTA) 200-200-20 MG/5ML suspension 30 mL  30 mL Oral Q4H PRN Nira ConnBerry, Jason A, NP       hydrOXYzine (ATARAX) tablet 25 mg  25 mg Oral TID PRN Jackelyn PolingBerry, Jason A, NP   25 mg at 09/25/21 2349   Levonorgestrel-Ethinyl Estradiol (AMETHIA) 0.15-0.03 &0.01 MG tablet 1 tablet  1 tablet Oral QHS Nira ConnBerry, Jason A, NP       levothyroxine (SYNTHROID) tablet 75 mcg  75 mcg Oral Daily Nira ConnBerry, Jason A, NP   75 mcg at 09/27/21 0606   magnesium hydroxide (MILK OF MAGNESIA) suspension 30 mL  30 mL Oral Daily PRN Nira ConnBerry, Jason A, NP   30 mL at 09/27/21 0852   metFORMIN (GLUCOPHAGE) tablet 500 mg  500 mg Oral Q breakfast Nira ConnBerry, Jason A, NP   500 mg at 09/27/21 0848   midodrine (PROAMATINE) tablet 2.5 mg  2.5 mg Oral BID WC Nira ConnBerry, Jason A, NP   2.5 mg at 09/26/21 1736   ondansetron (ZOFRAN) tablet 4 mg  4 mg Oral Q8H PRN Sindy GuadeloupeWilliams, Roy, NP   4 mg at 09/26/21 0435   ramelteon (ROZEREM) tablet 8 mg  8 mg Oral QHS Nira ConnBerry, Jason A, NP   8 mg at 09/26/21 2136   sertraline (ZOLOFT) tablet 50 mg  50 mg Oral Daily Nira ConnBerry, Jason A, NP   50 mg at 09/27/21 0848   topiramate (TOPAMAX) tablet 25 mg  25 mg Oral BID Nira ConnBerry, Jason A, NP   25 mg at 09/27/21 0848   vitamin B-12 (CYANOCOBALAMIN) tablet 50 mcg  50 mcg Oral Daily Nira ConnBerry, Jason A, NP   50 mcg at 09/27/21 0848   ziprasidone (GEODON) capsule 60 mg  60 mg Oral BID WC Mariel CraftMaurer, Sheila M, MD   60 mg at 09/27/21 0848    Current Outpatient Medications  Medication Sig Dispense Refill   Cyanocobalamin (VITAMIN B-12) 50 MCG tablet Take 1 tablet (50 mcg total) by mouth daily. 30 tablet 0   levothyroxine (SYNTHROID) 75 MCG tablet Take 1 tablet (75 mcg total) by mouth daily. 30 tablet 0   metFORMIN (GLUCOPHAGE) 500 MG tablet Take 1 tablet (500 mg total) by mouth daily with breakfast. 30 tablet 0   midodrine (PROAMATINE) 2.5 MG tablet Take 2.5 mg by mouth 2 (two) times daily.     sertraline (ZOLOFT) 50 MG tablet Take 1 tablet (50 mg total) by mouth daily. 30 tablet 0   SIMPESSE 0.15-0.03 &0.01 MG tablet Take 1 tablet by mouth at bedtime.     topiramate (TOPAMAX) 25 MG tablet Take 1 tablet (25 mg total) by mouth 2 (two) times daily. 60 tablet 0   viloxazine ER (QELBREE) 200 MG 24 hr capsule Take 200 mg by mouth daily.     ziprasidone (GEODON) 40 MG capsule Take 1 capsule (40 mg total) by mouth 2 (two) times daily with a meal. 60 capsule 0   PTA Medications: Prior to Admission medications   Medication Sig Start Date End  Date Taking? Authorizing Provider  Cyanocobalamin (VITAMIN B-12) 50 MCG tablet Take 1 tablet (50 mcg total) by mouth daily. 09/19/21 10/19/21 Yes Massengill, Harrold Donath, MD  levothyroxine (SYNTHROID) 75 MCG tablet Take 1 tablet (75 mcg total) by mouth daily. 05/09/21  Yes Massengill, Harrold Donath, MD  metFORMIN (GLUCOPHAGE) 500 MG tablet Take 1 tablet (500 mg total) by mouth daily with breakfast. 09/19/21 10/19/21 Yes Massengill, Harrold Donath, MD  midodrine (PROAMATINE) 2.5 MG tablet Take 2.5 mg by mouth 2 (two) times daily. 04/18/21  Yes [provider]  sertraline (ZOLOFT) 50 MG tablet Take 1 tablet (50 mg total) by mouth daily. 09/19/21 10/19/21 Yes Massengill, Harrold Donath, MD  SIMPESSE 0.15-0.03 &0.01 MG tablet Take 1 tablet by mouth at bedtime. 05/02/21  Yes [provider]  topiramate (TOPAMAX) 25 MG tablet Take 1 tablet (25 mg total) by mouth 2 (two) times daily. 09/18/21 10/18/21 Yes Massengill, Harrold Donath, MD   viloxazine ER (QELBREE) 200 MG 24 hr capsule Take 200 mg by mouth daily.   Yes [provider]  ziprasidone (GEODON) 40 MG capsule Take 1 capsule (40 mg total) by mouth 2 (two) times daily with a meal. 09/18/21 10/18/21 Yes Massengill, Harrold Donath, MD    Patient Stressors: Traumatic event   Other: Loneliness    Patient Strengths: Printmaker for treatment/growth  Supportive family/friends   Treatment Modalities: Medication Management, Group therapy, Case management,  1 to 1 session with clinician, Psychoeducation, Recreational therapy.   Physician Treatment Plan for Primary and Secondary Diagnosis:  Final diagnoses:  Schizoaffective disorder, bipolar type (HCC)  PTSD (post-traumatic stress disorder)  History of OCD (obsessive compulsive disorder)  Non-suicidal self-harm (HCC)   Long Term Goal(s): Improvement in symptoms so as ready for discharge  Short Term Goals: Ability to identify changes in lifestyle to reduce recurrence of condition will improve Ability to verbalize feelings will improve Ability to demonstrate self-control will improve Ability to identify and develop effective coping behaviors will improve Ability to identify triggers associated with substance abuse/mental health issues will improve  Medication Management: Evaluate patient's response, side effects, and tolerance of medication regimen.  Therapeutic Interventions: 1 to 1 sessions, Unit Group sessions and Medication administration.  Evaluation of Outcomes: Not Progressing  RN Treatment Plan for Primary Diagnosis:  Final diagnoses:  Schizoaffective disorder, bipolar type (HCC)  PTSD (post-traumatic stress disorder)  History of OCD (obsessive compulsive disorder)  Non-suicidal self-harm (HCC)    Long Term Goal(s): Knowledge of disease and therapeutic regimen to maintain health will improve  Short Term Goals: Ability to disclose and discuss suicidal ideas and Ability to identify  and develop effective coping behaviors will improve  Medication Management: RN will administer medications as ordered by provider, will assess and evaluate patient's response and provide education to patient for prescribed medication. RN will report any adverse and/or side effects to prescribing provider.  Therapeutic Interventions: 1 on 1 counseling sessions, Psychoeducation, Medication administration, Evaluate responses to treatment, Monitor vital signs and CBGs as ordered, Perform/monitor CIWA, COWS, AIMS and Fall Risk screenings as ordered, Perform wound care treatments as ordered.  Evaluation of Outcomes: Progressing   LCSW Treatment Plan for Primary Diagnosis:  Final diagnoses:  Schizoaffective disorder, bipolar type (HCC)  PTSD (post-traumatic stress disorder)  History of OCD (obsessive compulsive disorder)  Non-suicidal self-harm (HCC)    Long Term Goal(s): Safe transition to appropriate next level of care at discharge, Engage patient in therapeutic group addressing interpersonal concerns.  Short Term Goals: Engage patient in aftercare planning with referrals and  resources  Therapeutic Interventions: Assess for all discharge needs, 1 to 1 time with Child psychotherapist, Explore available resources and support systems, Assess for adequacy in community support network, Educate family and significant other(s) on suicide prevention, Complete Psychosocial Assessment, Interpersonal group therapy.  Evaluation of Outcomes: Progressing   Progress in Treatment: Attending groups: No. Participating in groups: No. Taking medication as prescribed: Yes. Toleration medication: Yes. Family/Significant other contact made: Yes, individual(s) contacted:  the patient's mother Patient understands diagnosis: Yes. Discussing patient identified problems/goals with staff: Yes. Medical problems stabilized or resolved: Yes. Denies suicidal/homicidal ideation: No. Patient continues to endorse passive  SI Issues/concerns per patient self-inventory: No. Other:   New problem(s) identified: None   New Short Term/Long Term Goal(s): Navia wants to participate in an outpatient partial hospitalization program to enhance coping mechanism and to ensure she receives medication management services.   Patient Goals:  "I just want to get stable again. I didn't think I was ready when I left the hospital"  Discharge Plan or Barriers: Jazmon plans to return home with her boyfriend and follow up with [facility] services. LCSW made a referral on the patient's behalf. LCSW will continue to follow.   Reason for Continuation of Hospitalization: Hallucinations Medication stabilization Suicidal ideation  Estimated Length of Stay: 3-5 days  Last 3 [place] Suicide Severity Risk Score: Flowsheet Row Counselor from 09/20/2021 in [facility] Admission (Discharged) [facility]21 in [facility]  C-SSRS RISK CATEGORY Moderate Risk High Risk High Risk       Last PHQ 2/9 Scores:    09/20/2021   10:38 AM  Depression screen PHQ 2/9  Decreased Interest 2  Down, Depressed, Hopeless 1  PHQ - 2 Score 3  Altered sleeping 2  Tired, decreased energy 2  Change in appetite 2  Feeling bad or failure about yourself  1  Trouble concentrating 1  Moving slowly or fidgety/restless 1  Suicidal thoughts 1  PHQ-9 Score 13  Difficult doing work/chores Extremely dIfficult    Scribe for Treatment Team: Maeola Sarah, LCSW 09/27/2021 4:11 PM

## 2021-09-27 NOTE — ED Notes (Signed)
Patient came to nursing station stating "The voices in my head are getting louder". Patient reports that she was trying to read a book but it was not working. Patient given coloring paper at coloring pencils as a tool of distraction. Patient agreed to give it a try. Patient able to verbally contract safety at this time. In dayroom at this coloring. Respirations equal and unlabored, skin warm and dry. No acute distress noted.

## 2021-09-27 NOTE — Clinical Social Work Psych Note (Addendum)
LCSW Update  Glorine presented with a euthymic affect , congruent mood, however she reported feeling "the same as yesterday", indicating that she continues to have passive suicidal ideations and auditory hallucinations of voices.   Malala shared that she "Always" hears voices, however she has identified challenging the voices has been an effective coping mechanism while in the [facility]. Tashonda reports that she will feel safe to discharge home with her boyfriend, tomorrow, if she is appropriate for discharge.   LCSW explained to Analiyah that due to her current symptomology, she was [facility] and was recommended to follow up with [facility] as an alternative by Hillery Jacks, NP (previous provider in  [facility]).   LCSW explained that these sessions will be held "in-person" and that the patient would need reliable transportation services to ensure she can participate. Tammra shared that she is interested in a referral to the program, and that she has her own source of transportation.   LCSW and Dr. Viviano Simas also discussed [facility]'s long-term residential mental health treatment program in [place], [place]. LCSW provided Terez with a physical copy of the facilities information of services and treatment. Cing shared that she will contact her mother to discuss further.   LCSW made a referral to [facility] on the patient's behalf and will continue to follow to establish an intake appointment.   Shortly after ending this encounter with Swara, she approached LCSW to share that her mother was agreeable with possible seeking services with [facility] and that they will continue to explore options via online once the patient returned home.   Kassandra denied having any additional questions or concerns at this time.    LCSW will continue to follow until discharge.   Baldo Daub, MSW, LCSW Clinical Child psychotherapist (Facility Based Crisis) [facility]

## 2021-09-27 NOTE — Group Note (Signed)
Group Topic: Understanding Self  Group Date: 09/27/2021 Start Time: 1220 End Time: 1245 Facilitators: Levander Campion  Department: [facility]  Number of Participants: 5  Group Focus: activities of daily living skills, coping skills, and problem solving Treatment Modality:  Behavior Modification Therapy and Solution-Focused Therapy Interventions utilized were patient education Purpose: enhance coping skills and regain self-worth  Name: Sabrina Bennett Date of Birth: 08-29-2000  MR: [id number]    Level of Participation: active Quality of Participation: attentive Interactions with others: gave feedback Mood/Affect: appropriate Triggers (if applicable): n/a Cognition: coherent/clear Progress: Moderate Response: n/a Plan: follow-up needed  Patients Problems:  Patient Active Problem List   Diagnosis Date Noted   Schizoaffective disorder, bipolar type (HCC) 09/11/2021   History of OCD (obsessive compulsive disorder) 09/11/2021   MDD (major depressive disorder), recurrent episode, severe (HCC) 09/10/2021   Panic disorder 05/04/2021   Bipolar 1 disorder, depressed, severe (HCC) 05/03/2021   PTSD (post-traumatic stress disorder) 05/03/2021   OCD (obsessive compulsive disorder) 05/03/2021   Generalized anxiety disorder 12/02/2020   Major depressive disorder 12/02/2020   POTS (postural orthostatic tachycardia syndrome) 12/02/2020   Vitamin D deficiency 12/02/2020   Subclinical hypothyroidism 05/17/2017

## 2021-09-27 NOTE — ED Notes (Addendum)
This nurse received a call from a representative from [facility] stating they will not be able to accept the patient because of her eating d/o and they will not be able to accommodate her needs

## 2021-09-27 NOTE — ED Notes (Signed)
Patient is siting in cafeteria  in eating meal. Patient voices no complaints or concerns at this time. Respirations equal and unlabored, skin warm and dry, NAD. No change in assessment or acuity. Q 15 minute safety checks remain in place.

## 2021-09-27 NOTE — ED Notes (Addendum)
Patient continues to endorse SI. Stated that she is still thinking about suicide. Patient reclusive, only came out of room per request to take medication. Patient sleeping comfortably. No acute distress noted. Will continue to monitor for safety.

## 2021-09-28 ENCOUNTER — Other Ambulatory Visit (HOSPITAL_COMMUNITY): Payer: 59

## 2021-09-28 ENCOUNTER — Ambulatory Visit (HOSPITAL_COMMUNITY): Payer: 59

## 2021-09-28 MED ORDER — ZIPRASIDONE HCL 60 MG PO CAPS: 60.0000 mg | ORAL_CAPSULE | Freq: Two times a day (BID) | ORAL | 0 refills | Status: AC

## 2021-09-28 MED ORDER — RAMELTEON 8 MG PO TABS: 8.0000 mg | ORAL_TABLET | Freq: Every day | ORAL | 0 refills | Status: AC

## 2021-09-28 NOTE — ED Provider Notes (Signed)
FBC/OBS ASAP Discharge Summary  Date and Time: 09/28/2021 12:25 PM  Name: Sabrina Bennett  MRN:  [id number]   Discharge Diagnoses:  Final diagnoses:  Schizoaffective disorder, bipolar type (HCC)  PTSD (post-traumatic stress disorder)  History of OCD (obsessive compulsive disorder)  Non-suicidal self-harm (HCC)    Subjective: "The voices are much quieter, and I feel like I can stay safe.  My boyfriend will be off for the next 4 days and we will be together."  Stay Summary: Sabrina Bennett is a 21 y.o. female with a documented history of schizoaffective disorder- bipolar type, bipolar 1 disorder, GAD, PTSD, OCD, panic disorder, and binge eating disorder who presents to [facility] as a walk-in due to worsening depression, suicidal ideations without a plan, and nonsuicidal self-injurious behavior.  She has a medical history of postural orthostatic tachycardia syndrome and subclinical hypothyroidism.  Patient was also evaluated as a walk-in at [facility] on 09/25/2021 and admitted to [facility] for crisis stabilization. Medication adjustments were made to include increase of Geodon to 60 mg daily.  Patient reports improvement in her auditory and visual hallucinations as well as suicidal ideation.  She denies any plan, access to means, or intent for suicide or self-harm.  She denies homicidal ideation.  She denies any side effect of medication. Resources were provided for patient to continue in a residential psychiatric hospitalization and referral was placed to [facility]. Patient is able to contract for safety while she applies to psychiatric residential treatment.  She will be under the care of her boyfriend and/or mother.  Total Time Spent in Direct Patient Care:  I personally spent 35 minutes on the unit in direct patient care. The direct patient care time included face-to-face time with the patient, reviewing the patient's chart, communicating with other professionals, and coordinating care. Greater than 50% of this  time was spent in counseling or coordinating care with the patient regarding goals of hospitalization, psycho-education, and discharge planning needs.   Past Psychiatric History: See above Past Medical History:  Past Medical History:  Diagnosis Date   Schizoaffective disorder (HCC)    History reviewed. No pertinent surgical history. Family History: History reviewed. No pertinent family history. Family Psychiatric History: None Social History:  Social History   Substance and Sexual Activity  Alcohol Use Yes     Social History   Substance and Sexual Activity  Drug Use Never    Social History   Socioeconomic History   Marital status: Single    Spouse name: Not on file   Number of children: Not on file   Years of education: Not on file   Highest education level: Not on file  Occupational History   Not on file  Tobacco Use   Smoking status: Never    Passive exposure: Never   Smokeless tobacco: Never  Vaping Use   Vaping Use: Never used  Substance and Sexual Activity   Alcohol use: Yes   Drug use: Never   Sexual activity: Yes    Birth control/protection: Pill, Condom  Other Topics Concern   Not on file  Social History Narrative   Not on file   Social Determinants of Health   Financial Resource Strain: Not on file  Food Insecurity: Not on file  Transportation Needs: Not on file  Physical Activity: Not on file  Stress: Not on file  Social Connections: Not on file   SDOH:  SDOH Screenings   Alcohol Screen: Low Risk    Last Alcohol Screening Score (AUDIT): 0  Depression (PHQ2-9): Medium Risk   PHQ-2 Score: 13  Financial Resource Strain: Not on file  Food Insecurity: Not on file  Housing: Not on file  Physical Activity: Not on file  Social Connections: Not on file  Stress: Not on file  Tobacco Use: Low Risk    Smoking Tobacco Use: Never   Smokeless Tobacco Use: Never   Passive Exposure: Never  Transportation Needs: Not on file    Tobacco Cessation:   N/A, patient does not currently use tobacco products  Current Medications:  Current Facility-Administered Medications  Medication Dose Route Frequency Provider Last Rate Last Admin   acetaminophen (TYLENOL) tablet 650 mg  650 mg Oral Q6H PRN Nira ConnBerry, Jason A, NP   650 mg at 09/27/21 1854   alum & mag hydroxide-simeth (MAALOX/MYLANTA) 200-200-20 MG/5ML suspension 30 mL  30 mL Oral Q4H PRN Nira ConnBerry, Jason A, NP       hydrOXYzine (ATARAX) tablet 25 mg  25 mg Oral TID PRN Nira ConnBerry, Jason A, NP   25 mg at 09/25/21 2349   Levonorgestrel-Ethinyl Estradiol (AMETHIA) 0.15-0.03 &0.01 MG tablet 1 tablet  1 tablet Oral QHS Nira ConnBerry, Jason A, NP   1 tablet at 09/27/21 2151   levothyroxine (SYNTHROID) tablet 75 mcg  75 mcg Oral Daily Nira ConnBerry, Jason A, NP   75 mcg at 09/28/21 0615   magnesium hydroxide (MILK OF MAGNESIA) suspension 30 mL  30 mL Oral Daily PRN Nira ConnBerry, Jason A, NP   30 mL at 09/27/21 0852   metFORMIN (GLUCOPHAGE) tablet 500 mg  500 mg Oral Q breakfast Nira ConnBerry, Jason A, NP   500 mg at 09/28/21 0809   midodrine (PROAMATINE) tablet 2.5 mg  2.5 mg Oral BID WC Nira ConnBerry, Jason A, NP   2.5 mg at 09/28/21 0809   ondansetron (ZOFRAN) tablet 4 mg  4 mg Oral Q8H PRN Sindy GuadeloupeWilliams, Roy, NP   4 mg at 09/26/21 0435   ramelteon (ROZEREM) tablet 8 mg  8 mg Oral QHS Nira ConnBerry, Jason A, NP   8 mg at 09/27/21 2150   sertraline (ZOLOFT) tablet 50 mg  50 mg Oral Daily Nira ConnBerry, Jason A, NP   50 mg at 09/28/21 0910   topiramate (TOPAMAX) tablet 25 mg  25 mg Oral BID Nira ConnBerry, Jason A, NP   25 mg at 09/28/21 0910   vitamin B-12 (CYANOCOBALAMIN) tablet 50 mcg  50 mcg Oral Daily Nira ConnBerry, Jason A, NP   50 mcg at 09/28/21 0910   ziprasidone (GEODON) capsule 60 mg  60 mg Oral BID WC Mariel CraftMaurer, Aarush Stukey M, MD   60 mg at 09/28/21 0809   Current Outpatient Medications  Medication Sig Dispense Refill   Cyanocobalamin (VITAMIN B-12) 50 MCG tablet Take 1 tablet (50 mcg total) by mouth daily. 30 tablet 0   levothyroxine (SYNTHROID) 75 MCG tablet Take 1 tablet (75 mcg  total) by mouth daily. 30 tablet 0   metFORMIN (GLUCOPHAGE) 500 MG tablet Take 1 tablet (500 mg total) by mouth daily with breakfast. 30 tablet 0   midodrine (PROAMATINE) 2.5 MG tablet Take 2.5 mg by mouth 2 (two) times daily.     sertraline (ZOLOFT) 50 MG tablet Take 1 tablet (50 mg total) by mouth daily. 30 tablet 0   SIMPESSE 0.15-0.03 &0.01 MG tablet Take 1 tablet by mouth at bedtime.     topiramate (TOPAMAX) 25 MG tablet Take 1 tablet (25 mg total) by mouth 2 (two) times daily. 60 tablet 0   viloxazine ER (QELBREE) 200 MG 24 hr capsule  Take 200 mg by mouth daily.     ramelteon (ROZEREM) 8 MG tablet Take 1 tablet (8 mg total) by mouth at bedtime. 30 tablet 0   ziprasidone (GEODON) 60 MG capsule Take 1 capsule (60 mg total) by mouth 2 (two) times daily with a meal. 60 capsule 0    PTA Medications: (Not in a hospital admission)   Musculoskeletal  Strength & Muscle Tone: within normal limits Gait & Station: normal Patient leans: N/A  Psychiatric Specialty Exam  Presentation  General Appearance: Casual; Well Groomed  Eye Contact:Good  Speech:Clear and Coherent; Normal Rate  Speech Volume:Normal  Handedness:Right   Mood and Affect  Mood:Anxious  Affect:Congruent   Thought Process  Thought Processes:Coherent  Descriptions of Associations:Intact  Orientation:Full (Time, Place and Person)  Thought Content:Logical  Diagnosis of Schizophrenia or Schizoaffective disorder in past: Yes    Hallucinations:Hallucinations: Auditory; Visual; Command Description of Command Hallucinations: hurt yourself Description of Auditory Hallucinations: quieter Description of Visual Hallucinations: "not as much"  Ideas of Reference:None  Suicidal Thoughts:Suicidal Thoughts: Yes, Passive SI Passive Intent and/or Plan: Without Intent; Without Plan; Without Means to Carry Out; Without Access to Means (at baseline)  Homicidal Thoughts:Homicidal Thoughts: No   Sensorium   Memory:Immediate Good; Recent Good; Remote Good  Judgment:Fair  Insight:Fair   Executive Functions  Concentration:Good  Attention Span:Good  Recall:Good  Fund of Knowledge:Good  Language:Good   Psychomotor Activity  Psychomotor Activity:Psychomotor Activity: Normal   Assets  Assets:Communication Skills; Desire for Improvement; Financial Resources/Insurance; Housing; Intimacy; Resilience; Social Support; Transportation; Vocational/Educational   Sleep  Sleep:Sleep: Fair   No data recorded  Physical Exam  Physical Exam Vitals and nursing note reviewed.  Constitutional:      Appearance: She is obese.  HENT:     Head: Normocephalic.  Eyes:     Extraocular Movements: Extraocular movements intact.  Pulmonary:     Effort: Pulmonary effort is normal. No respiratory distress.  Musculoskeletal:        General: Normal range of motion.     Cervical back: Normal range of motion.  Neurological:     General: No focal deficit present.     Mental Status: She is alert and oriented to person, place, and time.   Review of Systems  Constitutional: Negative.   Respiratory: Negative.    Cardiovascular: Negative.   Musculoskeletal: Negative.   Neurological: Negative.   Psychiatric/Behavioral:  Positive for depression (improved from baseline), hallucinations (improved from baseline) and suicidal ideas (passive, improved from baseline). Negative for substance abuse. The patient is nervous/anxious (at baseline). The patient does not have insomnia.   Blood pressure 104/69, pulse 75, temperature (!) 97.3 F (36.3 C), temperature source Tympanic, resp. rate 14, SpO2 100 %. There is no height or weight on file to calculate BMI.  Demographic Factors:  Adolescent or young adult and Caucasian  Loss Factors: NA  Historical Factors: Personality disorder  Risk Reduction Factors:   Sense of responsibility to family, Employed, Living with another person, especially a relative,  Positive social support, Positive therapeutic relationship, and Positive coping skills or problem solving skills  Continued Clinical Symptoms:  Dysthymia Personality Disorders:   Cluster B More than one psychiatric diagnosis  Cognitive Features That Contribute To Risk:  Polarized thinking    Suicide Risk:  Minimal: No identifiable suicidal ideation.  Patients presenting with no risk factors but with morbid ruminations; may be classified as minimal risk based on the severity of the depressive symptoms  Plan Of Care/Follow-up recommendations:  Activity:  ad lib Diet:  as tolerated  Disposition:  Patient is instructed prior to discharge to: Take all medications as prescribed by his/her mental healthcare provider.  A new prescription was provided for medications that were changed during [facility] stay. Report any adverse effects and or reactions from the medicines to his/her outpatient provider promptly. Keep all scheduled appointments, to ensure that you are getting refills on time and to avoid any interruption in your medication. If you are unable to keep an appointment call to reschedule.  Be sure to follow-up with resources and follow-up appointments provided.  Patient has been instructed & cautioned: To not engage in alcohol and or illegal drug use while on prescription medicines. In the event of worsening symptoms, patient is instructed to call the crisis hotline, 911 and or go to the nearest ED for appropriate evaluation and treatment of symptoms. To follow-up with his/her primary care provider for your other medical issues, concerns and or health care needs.   She was able to engage in safety planning including plan to return to nearest emergency room or contact emergency services if she feels unable to maintain her own safety or the safety of others. Patient had no further questions, comments, or concerns.  Discharge into care of her significant other, who agrees to maintain patient safety.   Patient aware to return to nearest crisis center, ED or to call 911 for worsening symptoms of depression, suicidal or homicidal thoughts or AVH.   Mariel Craft, MD 09/28/2021, 12:25 PM

## 2021-09-28 NOTE — ED Notes (Signed)
Pt is currently sleeping, no distress noted, environmental check complete, will continue to monitor patient for safety. ? ?

## 2021-09-28 NOTE — Discharge Instructions (Addendum)

## 2021-09-28 NOTE — ED Notes (Signed)
Snacks given 

## 2021-09-28 NOTE — ED Notes (Signed)
Patient awake early this morning.  She ate breakfast and attended to ADL's.  She is quiet and calm pacing around unit.  She is presently sitting in day room watching.  TV.

## 2021-09-28 NOTE — Clinical Social Work Psych Note (Addendum)
LCSW Update/Discharge Note  Shanti is requesting to discharge. LCSW and Dr. Viviano Simas both spoke with the patient yesterday about the patient potentially discharging home with her boyfriend with safety planning.  LCSW spoke with the patient's mother, Margaretta Chittum [phone]) and the patient's boyfriend, Jetta Lout [phone]).   Lanora Manis shared that she is aware the patient's symptoms is chronic and that she does not have any concerns with Nanie returning home with her boyfriend, [name]. Both Lanora Manis and [place] ensured that Syretta will have someone with her 24/7 until she can get into [facility]. [name] and [place] ensured LCSW that all weapons, sharp objects, etc have been removed from the patient's home.   LCSW sent a referral to [facility] on the patient's behalf. Lanora Manis reports that she will follow up with the referral to [facility] to inquire when a bed will be available for the patient. Lanora Manis and [place] thanked LCSW for the assistance and did not have any additional questions or concerns at this time.   LCSW and Nyellie also spoke about her potentially following up with [facility] to engage in DBT counseling services. Nykira expressed interest and shared that she will follow up with the agency to establish services. LCSW also listed additional resources in the patient's AVS.   Sawsan denied having any additional questions or concerns at this time. Jacia reports that her boyfriend [name] will be to pick her up once she is ready for discharge.    Baldo Daub, MSW, LCSW Clinical Child psychotherapist (Facility Based Crisis) [facility]

## 2021-09-29 ENCOUNTER — Other Ambulatory Visit (HOSPITAL_COMMUNITY): Payer: 59

## 2021-09-29 ENCOUNTER — Ambulatory Visit (HOSPITAL_COMMUNITY): Payer: 59

## 2021-09-30 ENCOUNTER — Telehealth (HOSPITAL_COMMUNITY): Payer: Self-pay

## 2021-09-30 NOTE — BH Assessment (Signed)
Care Management - Follow Up [facility] Discharges   Patient has been placed in an inpatient psychiatric hospital ([place]) on 09-10-2021

## 2021-10-02 ENCOUNTER — Other Ambulatory Visit (HOSPITAL_COMMUNITY): Payer: 59

## 2021-10-02 ENCOUNTER — Ambulatory Visit (HOSPITAL_COMMUNITY): Payer: 59

## 2022-01-31 NOTE — Psych (Signed)
Virtual Visit via Video Note  I connected with Sabrina Bennett on 09/22/21 at  9:00 AM EDT by a video enabled telemedicine application and verified that I am speaking with the correct person using two identifiers.  Location: Patient: patient home Provider: clinical home office   I discussed the limitations of evaluation and management by telemedicine and the availability of in person appointments. The patient expressed understanding and agreed to proceed.  I discussed the assessment and treatment plan with the patient. The patient was provided an opportunity to ask questions and all were answered. The patient agreed with the plan and demonstrated an understanding of the instructions.   The patient was advised to call back or seek an in-person evaluation if the symptoms worsen or if the condition fails to improve as anticipated.  Pt was provided 240 minutes of non-face-to-face time during this encounter.   Donia Guiles, LCSW   [facility] PHP THERAPIST PROGRESS NOTE  Sabrina Bennett [id number]  Session Time: 9:00- 10:00  Participation Level: Active  Behavioral Response: CasualAlertDepressed  Type of Therapy: Group Therapy  Treatment Goals addressed: Coping  Progress Towards Goals: Initial  Interventions: CBT, DBT, Supportive, and Reframing  Summary: Clinician led check-in regarding current stressors and situation, and review of patient completed daily inventory. Clinician utilized active listening and empathetic response and validated patient emotions. Clinician facilitated processing group on pertinent issues.?    Summary: Sabrina Bennett is a 21 y.o. female who presents with depression and mood symptoms. Patient arrived within time allowed. Patient rates her mood at a 3 on a scale of 1-10 with 10 being best. Pt states she feels "exhausted." Pt reports she went to the [facility] last night because she felt unsafe at home. Pt reports her AH were getting louder  and her SI increasing. Pt states she went with mom to [facility] and she was there until 2am. Pt states she is "okay" with not being admitted and reports her AH is more manageable today and she feels safe with mom at home. Pt denies current SI. Patient able to process. Patient engaged in discussion.         Session Time: 10:00 am - 11:00 am   Participation Level: Active   Behavioral Response: CasualAlertDepressed   Type of Therapy: Group Therapy   Treatment Goals addressed: Coping   Progress Towards Goals: Progressing   Interventions: CBT, DBT, Solution Focused, Strength-based, Supportive, and Reframing   Therapist Response: Cln continued topic of DBT distress tolerance skills and the ACCEPTS distraction skill. Group reviewed P-T-S skills and discussed how they can practice them in their every day life.    Therapist Response: Pt engaged in discussion and is able to brainstorm ways to apply the skills.          Session Time: 11:00 -12:00   Participation Level: Active   Behavioral Response: CasualAlertDepressed   Type of Therapy: Group Therapy, Occupational Therapy   Treatment Goals addressed: Coping   Progress Towards Goals: Progressing   Interventions: Supportive, Education   Summary:  Occupational Therapy group led by cln E. Hollan.   Therapist Response: See OT note         Session Time: 12:00 -1:00   Participation Level: Active   Behavioral Response: CasualAlertDepressed   Type of Therapy: Group therapy   Treatment Goals addressed: Coping   Progress Towards Goals: Progressing   Interventions: CBT; Solution focused; Supportive; Reframing   Summary: 12:00 - 12:50: Cln led discussion on ways to manage stressors and  feelings over the weekend. Group members  brainstormed things to do over the weekend for multiple levels of energy, access, and moods. Cln reviewed crisis services should they be needed and provided pt's with the text crisis line, mobile crisis,  national suicide hotline, [facility] 24/7 line, and information on [facility].    12:50 -1:00 Clinician led check-out. Clinician assessed for immediate needs, medication compliance and efficacy, and safety concerns.   Therapist Response: 12:00 - 12:50: Pt engaged in discussion and is able to identify 3 ideas of what to do over the weekend to keep their mind engaged.  12:50 - 1:00 pm: At check-out, patient reports no immediate concerns. Patient demonstrates progress as evidenced by reaching out when needing support. Patient denies SI/HI/self-harm thoughts at the end of group.    Suicidal/Homicidal: Nowithout intent/plan  Plan: Pt will continue in PHP while working to decrease depression and mood symptoms, increase ability to reframe thoughts, and increase ability to manage symptoms in a healthy manner.   Collaboration of Care: Medication Management AEB T Lewis  Patient/Guardian was advised Release of Information must be obtained prior to any record release in order to collaborate their care with an outside provider. Patient/Guardian was advised if they have not already done so to contact the registration department to sign all necessary forms in order for [place] to release information regarding their care.   Consent: Patient/Guardian gives verbal consent for treatment and assignment of benefits for services provided during this visit. Patient/Guardian expressed understanding and agreed to proceed.   Diagnosis: Schizoaffective disorder, bipolar type ([place]) [F25.0]    1. Schizoaffective disorder, bipolar type ([place])   2. Difficulty coping      Lorin Glass, LCSW

## 2022-01-31 NOTE — Psych (Signed)
Virtual Visit via Video Note  I connected with Sabrina Bennett on 09/21/21 at  9:00 AM EDT by a video enabled telemedicine application and verified that I am speaking with the correct person using two identifiers.  Location: Patient: patient home Provider: clinical home office   I discussed the limitations of evaluation and management by telemedicine and the availability of in person appointments. The patient expressed understanding and agreed to proceed.  I discussed the assessment and treatment plan with the patient. The patient was provided an opportunity to ask questions and all were answered. The patient agreed with the plan and demonstrated an understanding of the instructions.   The patient was advised to call back or seek an in-person evaluation if the symptoms worsen or if the condition fails to improve as anticipated.  Pt was provided 240 minutes of non-face-to-face time during this encounter.   Donia Guiles, LCSW   [facility] PHP THERAPIST PROGRESS NOTE  Sabrina Bennett [id number]  Session Time: 9:00- 10:00  Participation Level: Active  Behavioral Response: CasualAlertDepressed  Type of Therapy: Group Therapy  Treatment Goals addressed: Coping  Progress Towards Goals: Initial  Interventions: CBT, DBT, Supportive, and Reframing  Summary: Clinician led check-in regarding current stressors and situation, and review of patient completed daily inventory. Clinician utilized active listening and empathetic response and validated patient emotions. Clinician facilitated processing group on pertinent issues.?    Summary: Sabrina Bennett is a 21 y.o. female who presents with depression and mood symptoms. Patient arrived within time allowed. Patient rates her mood at a 4 on a scale of 1-10 with 10 being best. Pt states she feels "mentally exhausted." Pt reports she had a "really really rough day" yesterday and thought she may have to go inpatient. Pt reports her AH were loud  and it increased her SI. Pt states she is with her mom and she feels more stable when mom is in the house with her. Pt reports mom is active participant in her safety plan. Patient able to process. Patient engaged in discussion.         Session Time: 10:00 am - 11:00 am   Participation Level: Active   Behavioral Response: CasualAlertDepressed   Type of Therapy: Group Therapy   Treatment Goals addressed: Coping   Progress Towards Goals: Progressing   Interventions: CBT, DBT, Solution Focused, Strength-based, Supportive, and Reframing   Therapist Response: Cln introduced topic of DBT distress tolerance skills. Cln provided context for distress tolerance skills and how to practice them. Cln introduced the ACCEPTS distraction skills and group discusses ways to utilize "A" activities.    Therapist Response: Pt engaged in discussion and states ways to practice the "A" skill.          Session Time: 11:00 -12:00   Participation Level: Active   Behavioral Response: CasualAlertDepressed   Type of Therapy: Group Therapy, Occupational Therapy   Treatment Goals addressed: Coping   Progress Towards Goals: Progressing   Interventions: Supportive, Education   Summary:  Occupational Therapy group led by cln E. Hollan.   Therapist Response: See OT note         Session Time: 12:00 -1:00   Participation Level: Active   Behavioral Response: CasualAlertDepressed   Type of Therapy: Group therapy   Treatment Goals addressed: Coping   Progress Towards Goals: Progressing   Interventions: CBT; Solution focused; Supportive; Reframing   Summary: 12:00 - 12:50: Cln continued topic of DBT distress tolerance skills and the ACCEPTS distraction skill. Group reviewed C-C-E skills  and discussed how they can practice them in their every day life.  12:50 -1:00 Clinician led check-out. Clinician assessed for immediate needs, medication compliance and efficacy, and safety concerns.    Therapist Response: 12:00 - 12:50: Pt engaged in discussion and reports she is most likely to practice the "E" skill.  12:50 - 1:00 pm: At check-out, patient reports no immediate concerns. Patient demonstrates progress as evidenced by participating in first session. Patient denies SI/HI/self-harm thoughts at the end of group.    Suicidal/Homicidal: Nowithout intent/plan  Plan: Pt will continue in PHP while working to decrease depression and mood symptoms, increase ability to reframe thoughts, and increase ability to manage symptoms in a healthy manner.   Collaboration of Care: Medication Management AEB T Lewis  Patient/Guardian was advised Release of Information must be obtained prior to any record release in order to collaborate their care with an outside provider. Patient/Guardian was advised if they have not already done so to contact the registration department to sign all necessary forms in order for [place] to release information regarding their care.   Consent: Patient/Guardian gives verbal consent for treatment and assignment of benefits for services provided during this visit. Patient/Guardian expressed understanding and agreed to proceed.   Diagnosis: Severe episode of recurrent major depressive disorder, with psychotic features ([place]) [F33.3]    1. Severe episode of recurrent major depressive disorder, with psychotic features ([place])   2. Schizoaffective disorder, bipolar type ([place])       Lorin Glass, LCSW

## 2022-02-02 NOTE — Psych (Signed)
Virtual Visit via Video Note  I connected with Tawny Asal on 09/25/21 at  9:00 AM EDT by a video enabled telemedicine application and verified that I am speaking with the correct person using two identifiers.  Location: Patient: patient home Provider: clinical home office   I discussed the limitations of evaluation and management by telemedicine and the availability of in person appointments. The patient expressed understanding and agreed to proceed.  I discussed the assessment and treatment plan with the patient. The patient was provided an opportunity to ask questions and all were answered. The patient agreed with the plan and demonstrated an understanding of the instructions.   The patient was advised to call back or seek an in-person evaluation if the symptoms worsen or if the condition fails to improve as anticipated.  Pt was provided 240 minutes of non-face-to-face time during this encounter.   Lorin Glass, LCSW   [facility] PHP THERAPIST PROGRESS NOTE  Jamilee Lafosse [id number]  Session Time: 9:00- 10:00  Participation Level: Active  Behavioral Response: CasualAlertDepressed  Type of Therapy: Group Therapy  Treatment Goals addressed: Coping  Progress Towards Goals: Initial  Interventions: CBT, DBT, Supportive, and Reframing  Summary: Clinician led check-in regarding current stressors and situation, and review of patient completed daily inventory. Clinician utilized active listening and empathetic response and validated patient emotions. Clinician facilitated processing group on pertinent issues.?    Summary: Pamula Luther is a 21 y.o. female who presents with depression and mood symptoms. Patient arrived within time allowed. Patient rates her mood at a 2 on a scale of 1-10 with 10 being best. Pt states she feels "exhausted." Pt reports she had a "bad weekend" and the AH were loud. Pt states she punched something and went to the ED and nothing was broken. Pt  reports continued thoughts of NSSIB and SI due to her AH. Pt reports difficulty sleeping. Pt reports feeling in control of her Si at this time. Patient able to process. Patient engaged in discussion.         Session Time: 10:00 am - 11:00 am   Participation Level: Minimal   Behavioral Response: CasualAlertDepressed   Type of Therapy: Group Therapy   Treatment Goals addressed: Coping   Progress Towards Goals: Progressing   Interventions: CBT, DBT, Solution Focused, Strength-based, Supportive, and Reframing   Therapist Response:  Cln introduced topic of stress management and the model of the "4 A's of stress management:" avoid, alter, accept, and adapt. Group members worked through Advice worker and discussed barriers to utilizing the 4 A's for stressors.    Therapist Response: Pt engaged in discussion and reports understanding of how to utilize the 4 A's.            Session Time: 11:00 -12:00   Participation Level: Active   Behavioral Response: CasualAlertDepressed   Type of Therapy: Group Therapy, Occupational Therapy   Treatment Goals addressed: Coping   Progress Towards Goals: Progressing   Interventions: Supportive, Education   Summary:  Occupational Therapy group led by cln E. Hollan.   Therapist Response: See OT note         Session Time: 12:00 -1:00   Participation Level: Minimal   Behavioral Response: CasualAlertDepressed   Type of Therapy: Group therapy   Treatment Goals addressed: Coping   Progress Towards Goals: Progressing   Interventions: CBT; Solution focused; Supportive; Reframing   Summary: 12:00 - 12:50: Cln introduced topic of boundaries. Cln discussed how boundaries inform our relationships and affect self-esteem and personal  agency. Group discussed the three types of boundaries: rigid, porous, and healthy and when each type is most helpful/harmful.    12:50 -1:00 Clinician led check-out. Clinician assessed for immediate needs, medication  compliance and efficacy, and safety concerns.   Therapist Response: 12:00 - 12:50: Pt engaged in discussion and identifies situations in which they've been in different boundary states. 12:50 - 1:00 pm: At check-out, patient reports AH and SI have increased and she feels less in control of herself. Pt cannot contract for safety. NP, Myrle Sheng met with pt to assess for safety and pt is recommended to go to the [facility].     Suicidal/Homicidal: Yeswithout intent/plan  Plan: Pt will go to [facility] to be assessed for inpatient treatment and will follow recommendations made whether it be return to [facility] or be admitted for observation.   Collaboration of Care: Medication Management AEB T Lewis  Patient/Guardian was advised Release of Information must be obtained prior to any record release in order to collaborate their care with an outside provider. Patient/Guardian was advised if they have not already done so to contact the registration department to sign all necessary forms in order for [place] to release information regarding their care.   Consent: Patient/Guardian gives verbal consent for treatment and assignment of benefits for services provided during this visit. Patient/Guardian expressed understanding and agreed to proceed.   Diagnosis: Schizoaffective disorder, bipolar type ([place]) [F25.0]    1. Schizoaffective disorder, bipolar type ([place])   2. Difficulty coping      Lorin Glass, LCSW

## 2022-08-24 ENCOUNTER — Encounter (HOSPITAL_COMMUNITY): Payer: Self-pay

## 2022-08-24 ENCOUNTER — Ambulatory Visit (HOSPITAL_COMMUNITY)
Admission: EM | Admit: 2022-08-24 | Discharge: 2022-08-24 | Disposition: A | Discharge status: Home / Self Care | Payer: 59 | Attending: Internal Medicine | Admitting: Internal Medicine

## 2022-08-24 DIAGNOSIS — S161XXA Strain of muscle, fascia and tendon at neck level, initial encounter: Secondary | ICD-10-CM

## 2022-08-24 MED ORDER — IBUPROFEN 800 MG PO TABS
ORAL_TABLET | ORAL | Status: AC
  Filled 2022-08-24: qty 1

## 2022-08-24 MED ORDER — METHOCARBAMOL 500 MG PO TABS: 500.0000 mg | ORAL_TABLET | Freq: Two times a day (BID) | ORAL | 0 refills | Status: AC

## 2022-08-24 MED ORDER — IBUPROFEN 800 MG PO TABS
800.0000 mg | ORAL_TABLET | Freq: Once | ORAL | Status: AC
  Administered 2022-08-24: 800 mg via ORAL

## 2022-08-24 NOTE — ED Triage Notes (Signed)
Pt presents with c/o sharp pain to the back of her head. States it has happened when working out. Pt states the pain moved down to her shoulder and states she has a hard time moving her head now.

## 2022-08-24 NOTE — ED Provider Notes (Signed)
[facility]    CSN: [id number] Arrival date & time: 08/24/22  1900      History   Chief Complaint Chief Complaint  Patient presents with   Neck Pain    HPI Sabrina Bennett is a 22 y.o. female.   Patient presents urgent care for evaluation of pain to the left lower part of the head that radiates to the left neck starting abruptly today while she was walking on the treadmill at the gym.  She states she stopped walking after the pain started and the pain spread down her left neck and into her left shoulder.  Denies recent trauma/injuries to the neck or head.  She states she frequently gets headaches when walking on the treadmill but headaches do not usually radiate to the neck.  No dizziness, vision changes, numbness or tingling to the bilateral upper or lower extremities, shooting pains down the spine, or midline neck pain.  No previous injuries to the neck.  Pain is currently an 8 on a scale of 0-10 and is significantly worse with movement.  She describes the pain as a stiffness and a sharp shooting sensation that starts at the base of the left neck and shoots all the way down into the shoulder.  She has not attempted use of any over-the-counter medications to help with symptoms before coming to urgent care.   Neck Pain   Past Medical History:  Diagnosis Date   Schizoaffective disorder     Patient Active Problem List   Diagnosis Date Noted   Schizoaffective disorder, bipolar type 09/11/2021   History of OCD (obsessive compulsive disorder) 09/11/2021   MDD (major depressive disorder), recurrent episode, severe 09/10/2021   Panic disorder 05/04/2021   Bipolar 1 disorder, depressed, severe 05/03/2021   PTSD (post-traumatic stress disorder) 05/03/2021   OCD (obsessive compulsive disorder) 05/03/2021   Generalized anxiety disorder 12/02/2020   Major depressive disorder 12/02/2020   POTS (postural orthostatic tachycardia syndrome) 12/02/2020   Vitamin D deficiency  12/02/2020   Subclinical hypothyroidism 05/17/2017    History reviewed. No pertinent surgical history.  OB History   No obstetric history on file.      Home Medications    Prior to Admission medications   Medication Sig Start Date End Date Taking? Authorizing Provider  methocarbamol (ROBAXIN) 500 MG tablet Take 1 tablet (500 mg total) by mouth 2 (two) times daily. 08/24/22  Yes Carlisle Beers, FNP  levothyroxine (SYNTHROID) 75 MCG tablet Take 1 tablet (75 mcg total) by mouth daily. 05/09/21   Massengill, Harrold Donath, MD  metFORMIN (GLUCOPHAGE) 500 MG tablet Take 1 tablet (500 mg total) by mouth daily with breakfast. 09/19/21 10/19/21  Massengill, Harrold Donath, MD  midodrine (PROAMATINE) 2.5 MG tablet Take 2.5 mg by mouth 2 (two) times daily. 04/18/21   [provider]  ramelteon (ROZEREM) 8 MG tablet Take 1 tablet (8 mg total) by mouth at bedtime. 09/28/21   Mariel Craft, MD  sertraline (ZOLOFT) 50 MG tablet Take 1 tablet (50 mg total) by mouth daily. 09/19/21 10/19/21  Massengill, Harrold Donath, MD  SIMPESSE 0.15-0.03 &0.01 MG tablet Take 1 tablet by mouth at bedtime. 05/02/21   [provider]  topiramate (TOPAMAX) 25 MG tablet Take 1 tablet (25 mg total) by mouth 2 (two) times daily. 09/18/21 10/18/21  Massengill, Harrold Donath, MD  viloxazine ER (QELBREE) 200 MG 24 hr capsule Take 200 mg by mouth daily.    [provider]  ziprasidone (GEODON) 60 MG capsule Take 1 capsule (60  mg total) by mouth 2 (two) times daily with a meal. 09/28/21   Mariel Craft, MD    Family History History reviewed. No pertinent family history.  Social History Social History   Tobacco Use   Smoking status: Never    Passive exposure: Never   Smokeless tobacco: Never  Vaping Use   Vaping Use: Never used  Substance Use Topics   Alcohol use: Yes   Drug use: Never     Allergies   Ferrous sulfate   Review of Systems Review of Systems  Musculoskeletal:  Positive for neck pain.  Per  HPI   Physical Exam Triage Vital Signs ED Triage Vitals  Enc Vitals Group     BP 08/24/22 1934 120/82     Pulse Rate 08/24/22 1934 (!) 101     Resp 08/24/22 1934 19     Temp 08/24/22 1934 98.3 F (36.8 C)     Temp Source 08/24/22 1934 Oral     SpO2 08/24/22 1934 95 %     Weight --      Height --      Head Circumference --      Peak Flow --      Pain Score 08/24/22 1933 8     Pain Loc --      Pain Edu? --      Excl. in GC? --    No data found.  Updated Vital Signs BP 120/82 (BP Location: Right Arm)   Pulse (!) 101   Temp 98.3 F (36.8 C) (Oral)   Resp 19   SpO2 95%   Visual Acuity Right Eye Distance:   Left Eye Distance:   Bilateral Distance:    Right Eye Near:   Left Eye Near:    Bilateral Near:     Physical Exam Vitals and nursing note reviewed.  Constitutional:      Appearance: She is not ill-appearing or toxic-appearing.  HENT:     Head: Normocephalic and atraumatic.     Right Ear: Hearing and external ear normal.     Left Ear: Hearing and external ear normal.     Nose: Nose normal.     Mouth/Throat:     Lips: Pink.     Mouth: Mucous membranes are moist. No injury.     Tongue: No lesions. Tongue does not deviate from midline.     Palate: No mass and lesions.     Pharynx: Oropharynx is clear. Uvula midline. No pharyngeal swelling, oropharyngeal exudate, posterior oropharyngeal erythema or uvula swelling.     Tonsils: No tonsillar exudate or tonsillar abscesses.  Eyes:     General: Lids are normal. Vision grossly intact. Gaze aligned appropriately.     Extraocular Movements: Extraocular movements intact.     Conjunctiva/sclera: Conjunctivae normal.  Neck:     Comments: Normal strength against resistance with head movement bilaterally at the neck.  Sensation and strength intact to the bilateral upper and lower extremities. Pulmonary:     Effort: Pulmonary effort is normal.  Musculoskeletal:     Cervical back: Neck supple. Tenderness (Tenderness to  palpation over multiple points of the left trapezius muscle.) present. No swelling, edema, deformity, erythema, signs of trauma, lacerations, rigidity, spasms, torticollis, bony tenderness (No midline tenderness to the C-spine) or crepitus. Pain with movement present. Decreased range of motion (Secondary to tenderness).     Thoracic back: Normal.     Lumbar back: Normal.  Lymphadenopathy:     Cervical: No cervical adenopathy.  Skin:    General: Skin is warm and dry.     Capillary Refill: Capillary refill takes less than 2 seconds.     Findings: No rash.  Neurological:     General: No focal deficit present.     Mental Status: She is alert and oriented to person, place, and time. Mental status is at baseline.     Cranial Nerves: No dysarthria or facial asymmetry.  Psychiatric:        Mood and Affect: Mood normal.        Speech: Speech normal.        Behavior: Behavior normal.        Thought Content: Thought content normal.        Judgment: Judgment normal.      UC Treatments / Results  Labs (all labs ordered are listed, but only abnormal results are displayed) Labs Reviewed - No data to display  EKG   Radiology No results found.  Procedures Procedures (including critical care time)  Medications Ordered in UC Medications  ibuprofen (ADVIL) tablet 800 mg (800 mg Oral Given 08/24/22 2033)    Initial Impression / Assessment and Plan / UC Course  I have reviewed the triage vital signs and the nursing notes.  Pertinent labs & imaging results that were available during my care of the patient were reviewed by me and considered in my medical decision making (see chart for details).   1.  Acute strain of neck muscle Presentation is consistent with acute muscle strain of the neck that will likely resolve with rest, fluids, as needed use of ibuprofen and muscle relaxer, heat, and gentle range of motion exercises. May take ibuprofen  every 6 hours and robaxin muscle relaxer every  12 hours as needed for muscle spasm. Drowsiness precautions regarding muscle relaxer use discussed. Heat and gentle ROM exercises discussed. Deferred imaging today based on stable musculoskeletal exam findings and hemodynamically stable vital signs. Walking referral given to orthopedic provider should symptoms fail to improve in the next 1-2 weeks.   Discussed physical exam and available lab work findings in clinic with patient.  Counseled patient regarding appropriate use of medications and potential side effects for all medications recommended or prescribed today. Discussed red flag signs and symptoms of worsening condition,when to call the PCP office, return to urgent care, and when to seek higher level of care in the emergency department. Patient verbalizes understanding and agreement with plan. All questions answered. Patient discharged in stable condition.    Final Clinical Impressions(s) / UC Diagnoses   Final diagnoses:  Acute strain of neck muscle, initial encounter     Discharge Instructions      Your pain is likely due to a muscle strain which will improve on its own with time.   - Take ibuprofen 600 mg with food every 6 hours as needed for pain and inflammation. Do not take any other NSAID containing medicine when taking ibuprofen.   - You may also take the prescribed muscle relaxer as directed as needed for muscle aches/spasm.  Do not take this medication and drive or drink alcohol as it can make you sleepy.  Mainly use this medicine at nighttime as needed. - Apply heat 20 minutes on then 20 minutes off and perform gentle range of motion exercises to the area of greatest pain to prevent muscle stiffness and provide further pain relief.   Red flag symptoms to watch out for are numbness/tingling to the legs, weakness, loss of bowel/bladder control,  and/or worsening pain that does not respond well to medicines. Follow-up with your primary care provider or return to urgent care if  your symptoms do not improve in the next 3 to 4 days with medications and interventions recommended today. If your symptoms are severe (red flag), please go to the emergency room.  I hope you feel better!       ED Prescriptions     Medication Sig Dispense Auth. Provider   methocarbamol (ROBAXIN) 500 MG tablet Take 1 tablet (500 mg total) by mouth 2 (two) times daily. 20 tablet Carlisle Beers, FNP      PDMP not reviewed this encounter.   Carlisle Beers, [place] 08/24/22 2047

## 2022-08-24 NOTE — Discharge Instructions (Signed)
Your pain is likely due to a muscle strain which will improve on its own with time.   - Take ibuprofen 600mg with food every 6 hours as needed for pain and inflammation. Do not take any other NSAID containing medicine when taking ibuprofen.   - You may also take the prescribed muscle relaxer as directed as needed for muscle aches/spasm.  Do not take this medication and drive or drink alcohol as it can make you sleepy.  Mainly use this medicine at nighttime as needed. - Apply heat 20 minutes on then 20 minutes off and perform gentle range of motion exercises to the area of greatest pain to prevent muscle stiffness and provide further pain relief.   Red flag symptoms to watch out for are numbness/tingling to the legs, weakness, loss of bowel/bladder control, and/or worsening pain that does not respond well to medicines. Follow-up with your primary care provider or return to urgent care if your symptoms do not improve in the next 3 to 4 days with medications and interventions recommended today. If your symptoms are severe (red flag), please go to the emergency room.  I hope you feel better!  

## 2023-05-23 IMAGING — CR DG HAND COMPLETE 3+V*R*
3 series · 3 of 3 positions shown · non-contrast
Comparison: None Available.

CLINICAL DATA: Hand pain at the third metacarpophalangeal joint.

EXAM:
RIGHT HAND - COMPLETE 3+ VIEW

[x hand pa right]
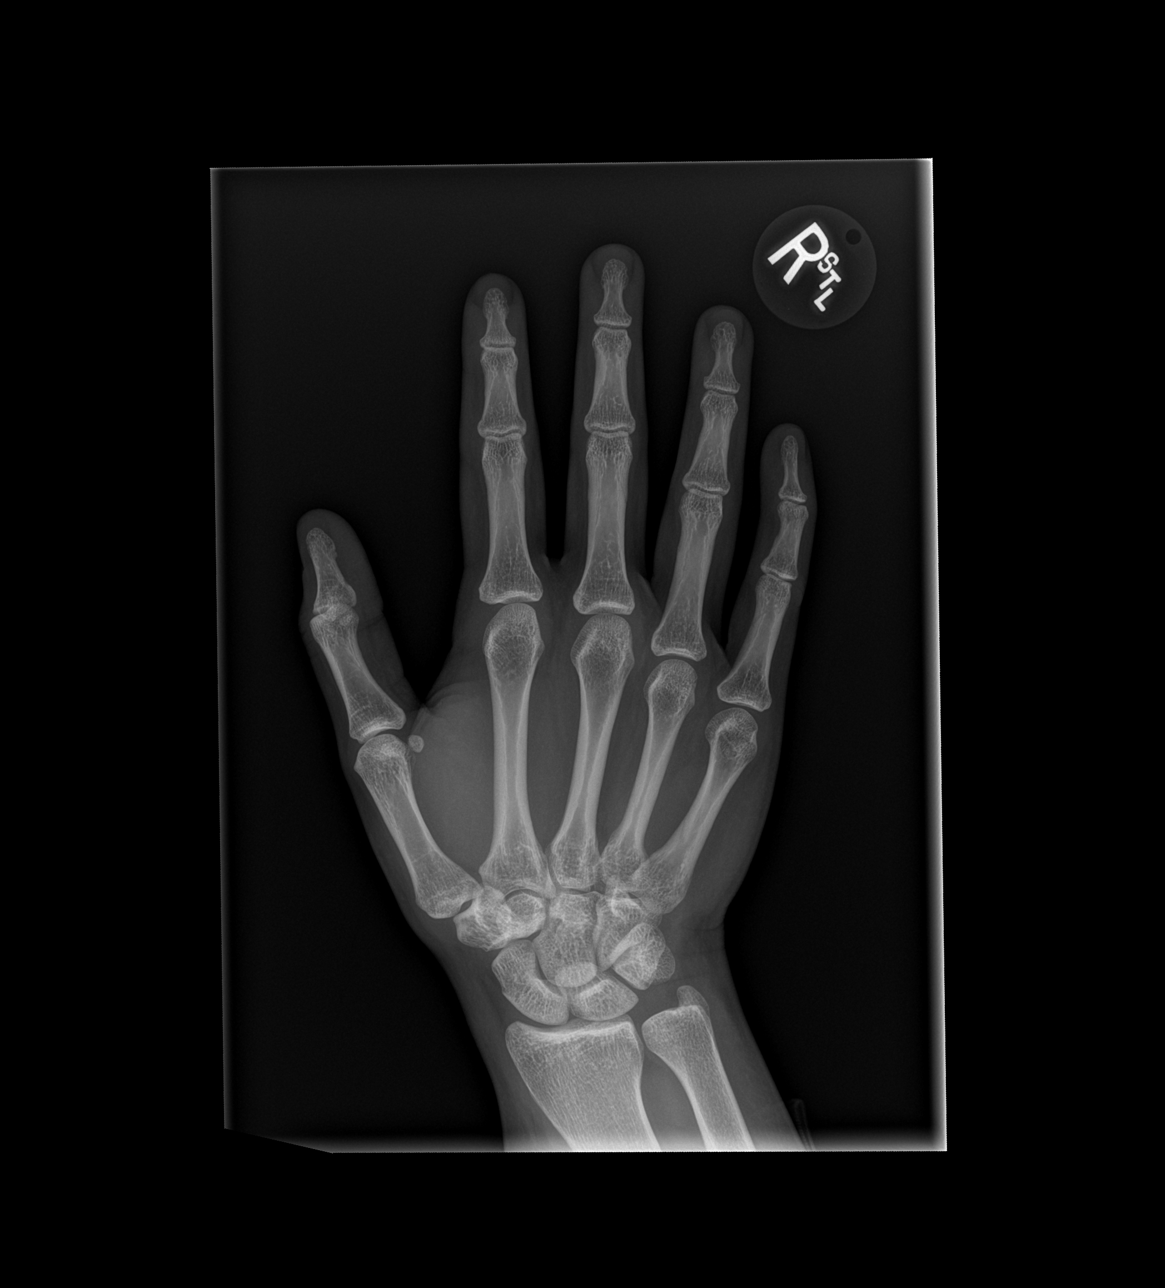

[x hand obl right]
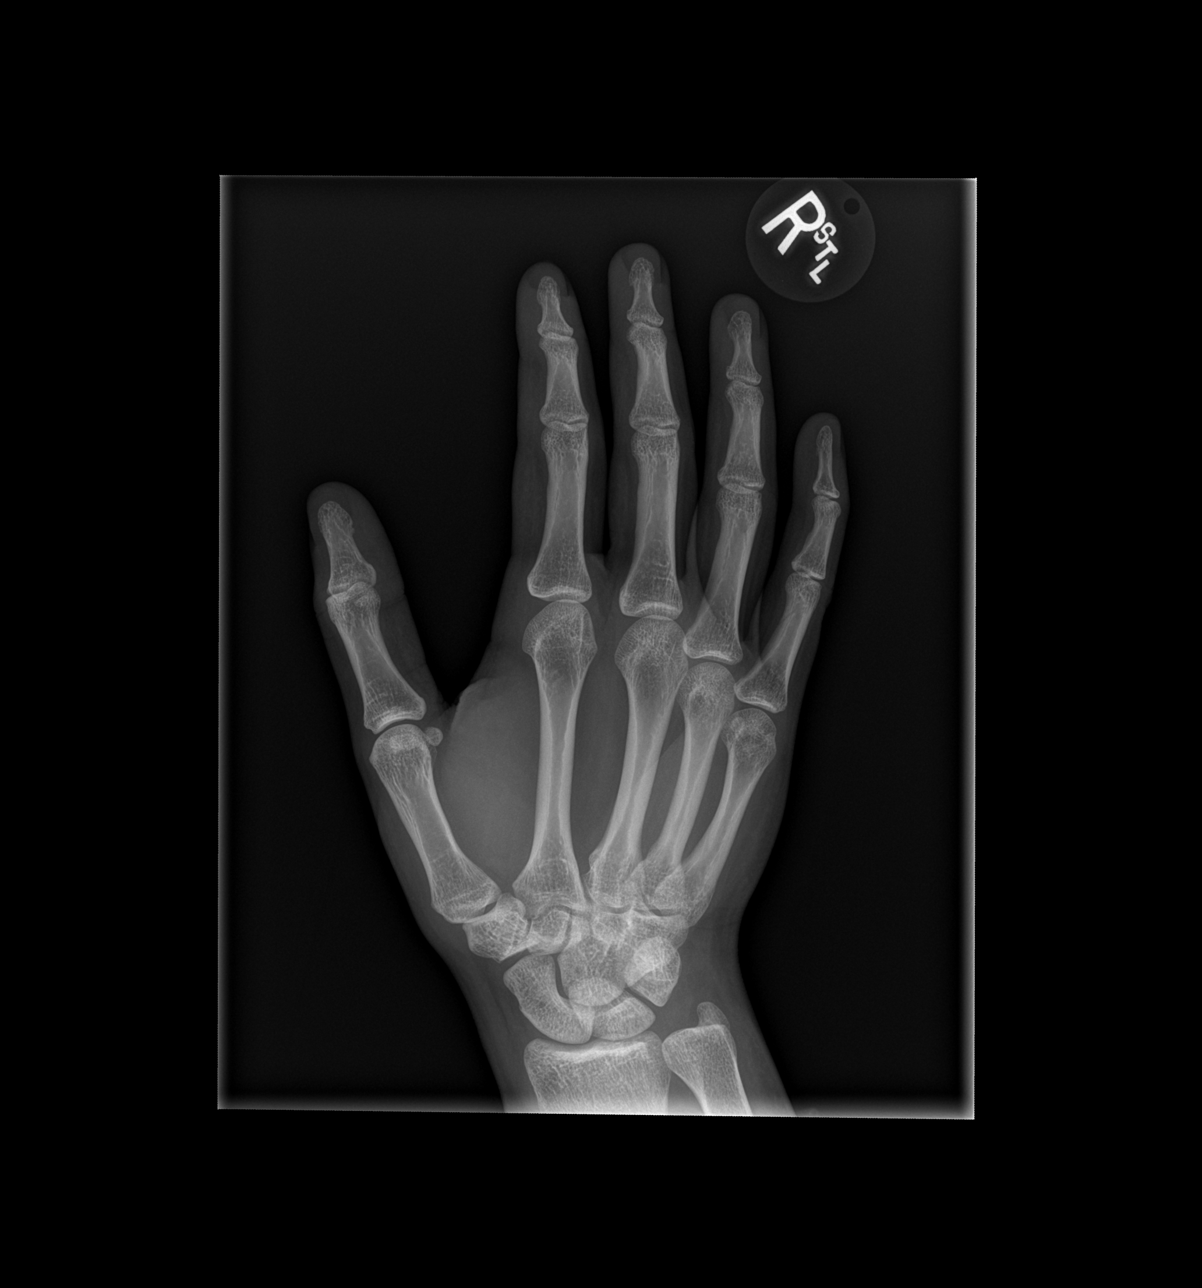

[x hand lat right]
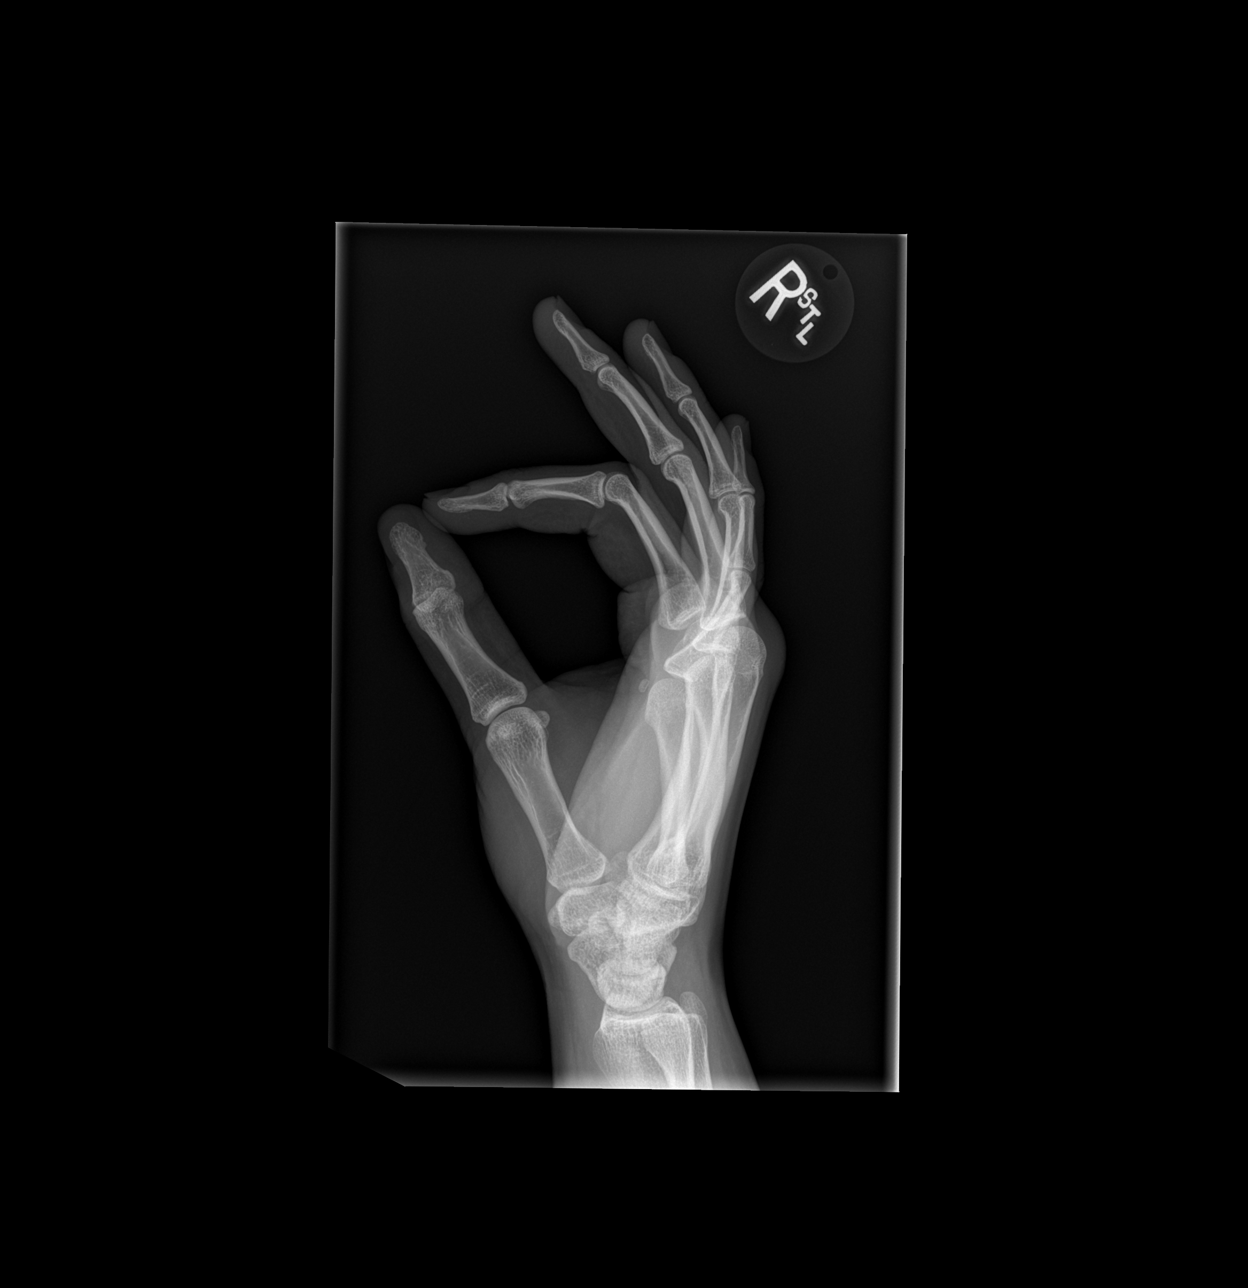

[3 of 3 positions shown; findings below may reference images not displayed]

FINDINGS: There is no evidence of fracture or dislocation. There is no
evidence of arthropathy or other focal bone abnormality. Soft
tissues are unremarkable.
IMPRESSION: Negative.
